# Patient Record
Sex: Female | Born: 1935 | Race: White | Hispanic: No | Smoking: Former smoker
Health system: Southern US, Community
[De-identification: ages and names within clinical notes are randomized; demographics above are authoritative.]

## PROBLEM LIST (undated history)

## (undated) DIAGNOSIS — I779 Disorder of arteries and arterioles, unspecified: Secondary | ICD-10-CM

## (undated) DIAGNOSIS — R21 Rash and other nonspecific skin eruption: Secondary | ICD-10-CM

## (undated) DIAGNOSIS — F419 Anxiety disorder, unspecified: Secondary | ICD-10-CM

## (undated) DIAGNOSIS — J449 Chronic obstructive pulmonary disease, unspecified: Secondary | ICD-10-CM

## (undated) DIAGNOSIS — J9611 Chronic respiratory failure with hypoxia: Secondary | ICD-10-CM

## (undated) DIAGNOSIS — Q828 Other specified congenital malformations of skin: Secondary | ICD-10-CM

## (undated) DIAGNOSIS — E785 Hyperlipidemia, unspecified: Secondary | ICD-10-CM

## (undated) DIAGNOSIS — I4891 Unspecified atrial fibrillation: Secondary | ICD-10-CM

## (undated) DIAGNOSIS — M359 Systemic involvement of connective tissue, unspecified: Secondary | ICD-10-CM

## (undated) DIAGNOSIS — Z789 Other specified health status: Secondary | ICD-10-CM

## (undated) DIAGNOSIS — I739 Peripheral vascular disease, unspecified: Secondary | ICD-10-CM

## (undated) DIAGNOSIS — E079 Disorder of thyroid, unspecified: Secondary | ICD-10-CM

## (undated) DIAGNOSIS — I10 Essential (primary) hypertension: Secondary | ICD-10-CM

## (undated) DIAGNOSIS — I509 Heart failure, unspecified: Secondary | ICD-10-CM

## (undated) HISTORY — PX: BACK SURGERY: SHX140

---

## 1999-01-18 ENCOUNTER — Encounter: Payer: Self-pay | Admitting: *Deleted

## 1999-01-19 ENCOUNTER — Inpatient Hospital Stay: Admission: RE | Admit: 1999-01-19 | Discharge: 1999-01-21 | Payer: Self-pay | Admitting: *Deleted

## 1999-07-26 ENCOUNTER — Encounter: Payer: Self-pay | Admitting: Emergency Medicine

## 1999-07-26 ENCOUNTER — Emergency Department (HOSPITAL_COMMUNITY): Admission: EM | Admit: 1999-07-26 | Discharge: 1999-07-26 | Payer: Self-pay | Admitting: Emergency Medicine

## 1999-08-06 ENCOUNTER — Ambulatory Visit (HOSPITAL_COMMUNITY): Admission: RE | Admit: 1999-08-06 | Discharge: 1999-08-06 | Payer: Self-pay | Admitting: Specialist

## 1999-08-06 ENCOUNTER — Encounter: Payer: Self-pay | Admitting: Specialist

## 2002-04-16 ENCOUNTER — Ambulatory Visit (HOSPITAL_COMMUNITY): Admission: RE | Admit: 2002-04-16 | Discharge: 2002-04-16 | Payer: Self-pay | Admitting: Urology

## 2002-04-16 ENCOUNTER — Encounter: Payer: Self-pay | Admitting: Urology

## 2003-03-10 ENCOUNTER — Emergency Department (HOSPITAL_COMMUNITY): Admission: EM | Admit: 2003-03-10 | Discharge: 2003-03-10 | Payer: Self-pay | Admitting: Emergency Medicine

## 2003-05-03 ENCOUNTER — Ambulatory Visit (HOSPITAL_COMMUNITY): Admission: RE | Admit: 2003-05-03 | Discharge: 2003-05-03 | Payer: Self-pay | Admitting: Ophthalmology

## 2004-11-25 ENCOUNTER — Emergency Department (HOSPITAL_COMMUNITY): Admission: EM | Admit: 2004-11-25 | Discharge: 2004-11-25 | Payer: Self-pay | Admitting: Emergency Medicine

## 2007-10-13 ENCOUNTER — Ambulatory Visit: Payer: Self-pay | Admitting: Cardiovascular Disease

## 2008-01-13 ENCOUNTER — Encounter (HOSPITAL_COMMUNITY): Admission: RE | Admit: 2008-01-13 | Discharge: 2008-02-12 | Payer: Self-pay | Admitting: Cardiovascular Disease

## 2008-01-13 ENCOUNTER — Ambulatory Visit: Payer: Self-pay | Admitting: Cardiovascular Disease

## 2009-05-08 ENCOUNTER — Ambulatory Visit: Payer: Self-pay | Admitting: Internal Medicine

## 2009-05-08 ENCOUNTER — Inpatient Hospital Stay (HOSPITAL_COMMUNITY): Admission: EM | Admit: 2009-05-08 | Discharge: 2009-05-19 | Payer: Self-pay | Admitting: Emergency Medicine

## 2009-05-10 ENCOUNTER — Encounter (INDEPENDENT_AMBULATORY_CARE_PROVIDER_SITE_OTHER): Payer: Self-pay | Admitting: Internal Medicine

## 2009-05-11 ENCOUNTER — Encounter: Payer: Self-pay | Admitting: Internal Medicine

## 2009-05-30 ENCOUNTER — Encounter: Payer: Self-pay | Admitting: Interventional Radiology

## 2010-12-16 ENCOUNTER — Encounter: Payer: Self-pay | Admitting: Cardiovascular Disease

## 2011-02-06 ENCOUNTER — Ambulatory Visit (HOSPITAL_COMMUNITY)
Admission: RE | Admit: 2011-02-06 | Discharge: 2011-02-06 | Disposition: A | Discharge status: Home / Self Care | Payer: Medicare Other | Source: Ambulatory Visit | Attending: Internal Medicine | Admitting: Internal Medicine

## 2011-02-06 ENCOUNTER — Other Ambulatory Visit (HOSPITAL_COMMUNITY): Payer: Self-pay | Admitting: Internal Medicine

## 2011-02-06 DIAGNOSIS — N390 Urinary tract infection, site not specified: Secondary | ICD-10-CM

## 2011-02-06 DIAGNOSIS — N200 Calculus of kidney: Secondary | ICD-10-CM

## 2011-02-06 DIAGNOSIS — R1032 Left lower quadrant pain: Secondary | ICD-10-CM | POA: Insufficient documentation

## 2011-03-04 LAB — BASIC METABOLIC PANEL
BUN: 17 mg/dL (ref 6–23)
BUN: 18 mg/dL (ref 6–23)
BUN: 29 mg/dL — ABNORMAL HIGH (ref 6–23)
CO2: 21 mEq/L (ref 19–32)
CO2: 30 mEq/L (ref 19–32)
CO2: 23 mEq/L (ref 19–32)
Calcium: 8.4 mg/dL (ref 8.4–10.5)
Chloride: 109 mEq/L (ref 96–112)
Creatinine, Ser: 1.01 mg/dL (ref 0.4–1.2)
GFR calc Af Amer: >60 mL/min (ref >60)
GFR calc non Af Amer: 54 mL/min — ABNORMAL LOW (ref >60)
GFR calc non Af Amer: 54 mL/min — ABNORMAL LOW (ref >60)
GFR calc non Af Amer: 57 mL/min — ABNORMAL LOW (ref >60)
GFR calc non Af Amer: 48 mL/min — ABNORMAL LOW (ref >60)
Glucose, Bld: 103 mg/dL — ABNORMAL HIGH (ref 70–99)
Glucose, Bld: 106 mg/dL — ABNORMAL HIGH (ref 70–99)
Glucose, Bld: 111 mg/dL — ABNORMAL HIGH (ref 70–99)
Glucose, Bld: 91 mg/dL (ref 70–99)
Glucose, Bld: 111 mg/dL — ABNORMAL HIGH (ref 70–99)
Potassium: 4.2 mEq/L (ref 3.5–5.1)
Potassium: 4.3 mEq/L (ref 3.5–5.1)
Potassium: 4.4 mEq/L (ref 3.5–5.1)
Potassium: 4.2 mEq/L (ref 3.5–5.1)
Sodium: 140 mEq/L (ref 135–145)
Sodium: 140 mEq/L (ref 135–145)
Sodium: 143 mEq/L (ref 135–145)

## 2011-03-04 LAB — CBC
HCT: 25.2 % — ABNORMAL LOW (ref 36.0–46.0)
HCT: 28.2 % — ABNORMAL LOW (ref 36.0–46.0)
HCT: 29.5 % — ABNORMAL LOW (ref 36.0–46.0)
HCT: 32.7 % — ABNORMAL LOW (ref 36.0–46.0)
HCT: 35.6 % — ABNORMAL LOW (ref 36.0–46.0)
Hemoglobin: 9.3 g/dL — ABNORMAL LOW (ref 12.0–15.0)
Hemoglobin: 9.4 g/dL — ABNORMAL LOW (ref 12.0–15.0)
Hemoglobin: 9.8 g/dL — ABNORMAL LOW (ref 12.0–15.0)
Hemoglobin: 9.9 g/dL — ABNORMAL LOW (ref 12.0–15.0)
Hemoglobin: 11.9 g/dL — ABNORMAL LOW (ref 12.0–15.0)
MCHC: 33.6 g/dL (ref 30.0–36.0)
MCHC: 33.8 g/dL (ref 30.0–36.0)
MCHC: 33.9 g/dL (ref 30.0–36.0)
MCHC: 34.6 g/dL (ref 30.0–36.0)
MCV: 100.9 fL — ABNORMAL HIGH (ref 78.0–100.0)
MCV: 101.8 fL — ABNORMAL HIGH (ref 78.0–100.0)
MCV: 100.5 fL — ABNORMAL HIGH (ref 78.0–100.0)
Platelets: 136 10*3/uL — ABNORMAL LOW (ref 150–400)
Platelets: 188 10*3/uL (ref 150–400)
Platelets: 191 10*3/uL (ref 150–400)
RDW: 13.8 % (ref 11.5–15.5)
RDW: 13.9 % (ref 11.5–15.5)
RDW: 14 % (ref 11.5–15.5)
RDW: 14 % (ref 11.5–15.5)
RDW: 14.1 % (ref 11.5–15.5)
RDW: 14.2 % (ref 11.5–15.5)
WBC: 6.1 10*3/uL (ref 4.0–10.5)
WBC: 8.9 10*3/uL (ref 4.0–10.5)

## 2011-03-04 LAB — CULTURE, BLOOD (ROUTINE X 2): Culture: NO GROWTH

## 2011-03-04 LAB — URINE CULTURE
Colony Count: 10000
Special Requests: NEGATIVE

## 2011-03-04 LAB — DIFFERENTIAL
Basophils Absolute: 0 10*3/uL (ref 0.0–0.1)
Basophils Relative: 1 % (ref 0–1)
Eosinophils Absolute: 0.1 10*3/uL (ref 0.0–0.7)
Eosinophils Relative: 1 % (ref 0–5)
Lymphocytes Relative: 28 % (ref 12–46)
Monocytes Absolute: 0.4 10*3/uL (ref 0.1–1.0)

## 2011-03-04 LAB — RETICULOCYTES: Retic Ct Pct: 1.9 % (ref 0.4–3.1)

## 2011-03-04 LAB — IRON AND TIBC
Saturation Ratios: 13 % — ABNORMAL LOW (ref 20–55)
UIBC: 336 ug/dL

## 2011-03-04 LAB — VITAMIN B12: Vitamin B-12: 786 pg/mL (ref 211–911)

## 2011-03-04 LAB — FOLATE: Folate: 15.7 ng/mL

## 2011-03-04 LAB — URINALYSIS, ROUTINE W REFLEX MICROSCOPIC
Bilirubin Urine: NEGATIVE
Ketones, ur: NEGATIVE mg/dL
Nitrite: NEGATIVE
Protein, ur: NEGATIVE mg/dL

## 2011-04-09 NOTE — Discharge Summary (Signed)
NAMEDIALA, WAXMAN               ACCOUNT NO.:  [id number]   MEDICAL RECORD NO.:  [id number]          PATIENT TYPE:  INP   LOCATION:  3009                         FACILITY:  [facility]   PHYSICIAN:  Beckey Rutter, MD  DATE OF BIRTH:  [birth year]   DATE OF ADMISSION:  05/08/2009  DATE OF DISCHARGE:  05/19/2009                               DISCHARGE SUMMARY   PRIMARY CARE PHYSICIAN:  Dr. Sherril Croon in [place].   CHIEF COMPLAINT:  the patient admitted after fall.   PROCEDURES:  1. CT spine without contrast on May 08, 2009.  Impression:      Significant left scalp injury.  No acute intracranial or calvarial      findings.  The cervical part of the CT scan was showing no evidence      of acute cervical spine fracture, subluxation, or static signs of      instability.  2. CT head without contrast was negative as delineated above.  3. The x-ray to the knee showing no acute osseous finding to the knee      joint.  4. Lumbar spine x-rays showing lumbar spondylosis as described.  No      acute osseous finding or malalignment.  5. Chest x-ray on June 14.  The patient has no active cardiopulmonary      disease.  6. X-ray to the thoracic spine with impression showing age-      indeterminate T4 compression fracture.  7. Chest x-ray on May 09, 2009.  Impression:  Stable, no acute      cardiopulmonary disease.  8. CT to the thoracic spine on May 09, 2009.  Impression:      a.     Fracture of the T3 and T4 vertebral bodies as described.       Both fractures appear to extend into the posterior elements.       Also, those elements are nondisplaced, and there is no associated       facet subluxation or anterolisthesis.      b.     Small amount of peroneal hemorrhage suspected.  There is no       large subdural hematoma or compromise.  9. Vertebroplasty on May 11, 2009.  Impression is status post      vertebral body augmentation for compression fracture at T4 using      vertebroplasty  technique, done by Dr. Grandville Silos. Deveshwar.  10.Chest x-ray May 11, 2009.  Impression:  Cardiomegaly with vascular      congestion.  11.Chest x-ray on May 16, 2009.  Impression:  Cardiomegaly with      vascular congestion plus questionable mild interstitial edema.  12.May 18, 2009, chest x-ray on the patient showing progressive      congestive heart failure.   DISCHARGE MEDICATIONS:  1. Aspirin 81 mg p.o. daily.  2. Calcitonin salmon nasal spray daily.  3. Os-Cal 1 tablet p.o. daily.  4. Celexa 20 mg daily.  5. Plavix 75 mg daily.  6. Cyanocobalamin B12 1000 mcg subcutaneous every week for 4 weeks.  7. Lasix 20 mg p.o. daily.  8. Synthroid 100 mcg p.o. daily.  9. Methotrexate 10 mg p.o. q. Sunday.  Dose as prescribed.  10.Vicodin 7.5/500 one tablet p.o. q.4 hours.  11.Senokot S 2 tablets p.o. b.i.d.  12.Zocor 40 mg p.o. daily.  13.Miralax 17 g p.o. daily p.r.n.  14.The Ultram dose is 50-100 mg p.o. every 4 to 6 hours p.r.n. pain.      This medicine should be given after Vicodin if the pain persists      after the Vicodin medication.  This medicine should not be given if      the patient is lethargic.   DISCHARGE DIAGNOSES:  1. A T4 compression fracture status post kyphoplasty/vertebroplasty by      interventional radiology.  2. Scalp hematoma status post fold.  3. Status post hypertension.  4. Hypothyroidism on Synthroid.  5. Hyperlipidemia.   DISCUSSION/PLAN:  The patient is status post vertebroplasty as discussed  above.  The patient was complaining of constipation and she was being  given laxative prior to her discharge today.  This constipation is felt  secondary to the excessive pain medication she received secondary to her  compression fracture.  She is stable for discharge today to the nursing  facility to continue with physical therapy.      Beckey Rutter, MD  Electronically Signed     EME/MEDQ  D:  05/19/2009  T:  05/19/2009  Job:  [id number]   cc:    Dr. Sherril Croon

## 2011-04-09 NOTE — Assessment & Plan Note (Signed)
[facility]                       [place] CARDIOLOGY OFFICE NOTE   Sierra Singleton, Sierra Singleton                      MRN:          [id number]  DATE:10/13/2007                            DOB:          11-09-1933    Sierra Singleton is a 77 year old patient referred by [facility],  Dr. Sherril Croon, for dyspnea.   In talking to the patient, her dyspnea has been chronic. It has been  well over a year. There has been no cough or pleuritic pain. No history  of pulmonary hypertension, DVT. She does not have dyspnea at rest. It  can be particularly bad when she goes up hill. She rarely does not think  that her dyspnea has changed at all over the last year. She is a  nonsmoker.   I suspect she was referred more for her cardiovascular risk factors and  screening for coronary disease. The patient has had a previous carotid  endarterectomy by Dr. Madilyn Fireman. This was many years ago. We do not have  records on this as the patient indicates that this was probably back in  1998 or 1999. She actually needs followup surveillance for this and  since she has persistent bruits.   Her coronary risk factors otherwise include hypertension, hyperlipidemia  and known vascular disease.   The patient has not had any significant chest pain. She does  occasionally get postural lightheadedness when she stands up too  quickly.   She is a nondiabetic, nonsmoker.   Her review of systems is otherwise negative.   PAST SURGICAL HISTORY:  Left carotid endarterectomy by Dr. Madilyn Fireman in  1998.   ALLERGIES:  She has no known allergies.   She has a history of hyperlipidemia and hypertension.   The patient's first husband is widowed. She divorced her second husband.  She just got laid off from [employer]. She has three children.  She is fairly inactive and does not smoke or drink.   The patient's family history is remarkable for father dying of cancer in  his 68s, mother dying of a  stroke in her 56s.   CURRENT MEDICATIONS:  1. Methotrexate 2.5 a day.  2. Diovan hydrochlorothiazide 160/12.5.  3. Synthroid 75 mcg a day.  4. Plavix 75 a day.  5. Lipitor 20 a day.  6. Folic acid.  7. Aspirin a day.  8. Ambien.  9. Lyrica 75 a day.   PHYSICAL EXAMINATION:  Remarkable for a somewhat anxious white female in  no distress. Her blood pressure is 130/70, pulse 75 and regular.  Respiratory rate is 14. Afebrile.  HEENT: Is unremarkable. Bilateral carotid bruits with left CEA. No JVP  elevation or lymphadenopathy or thyromegaly.  LUNGS:  Clear with good diaphragmatic motion. No wheezing.  S1, S2 with normal heart sounds. PMI normal.  ABDOMEN: Benign. Bowel sounds positive. No bruits. No hepatosplenomegaly  or hepatojugular reflux. No tenderness. No AAA.  Distal pulses are intact. No edema. She has varicosities with spider  veins, trace edema.   EKG is normal.   IMPRESSION:  1. Dyspnea, functional. No evidence of cardiopulmonary disease. Check  2D echocardiogram to assess left ventricular and right ventricular      function. Continue low dose diuretic.  2. Vascular disease. Needs followup carotid duplex for screening      persistent bruits. Continue aspirin and Plavix.  3. Screening coronary artery disease. Given her risk factors of      hyperlipidemia, hypertension and vascular disease, she will have an      adenosine Myoview study.  4. Hyperlipidemia. Continue Lipitor 20 a day. Lipid and liver profile      in six months.  5. Hypothyroidism. Check TSH and T4 in six months. Continue Synthroid      75 mcg a day.  6. Hypertension. Currently well-controlled. Continue low salt diet and      Diovan hydrochlorothiazide to continue.  7. Peripheral spider veins. Continue low dose diuretic. The patient      does not seem interested in having these taken care of. I suspect      that one of the vein clinics could inject them. This will be      discussed with Dr.  Sherril Croon.   We will see her on an as needed basis unless her echo or Myoview show  marked abnormalities. If she has significant carotid re-stenosis she  will see Dr. Madilyn Fireman.     Sierra Singleton. Eden Emms, MD, [facility]  Electronically Signed    PCN/MedQ  DD: 10/13/2007  DT: 10/13/2007  Job #: [id number]

## 2011-04-09 NOTE — Consult Note (Signed)
Sierra Singleton, Sierra Singleton               ACCOUNT NO.:  [id number]   MEDICAL RECORD NO.:  [id number]          PATIENT TYPE:  OUT   LOCATION:  XRAY                         FACILITY:  [facility]   PHYSICIAN:  Sanjeev K. Deveshwar, M.D.DATE OF BIRTH:  May 27, 1933   DATE OF CONSULTATION:  05/30/2009  DATE OF DISCHARGE:  05/30/2009                                 CONSULTATION   CHIEF COMPLAINT:  Status post T4 vertebroplasty performed on May 11, 2009.   BRIEF HISTORY:  This is a pleasant 77 year old female who fell at home  on May 08, 2009, and suffered facial injuries including a scalp  laceration as well as thoracic compression fractures of T4 and possibly  T3.  The patient was admitted to [facility].  Dr. Corliss Skains saw  the patient in consultation for evaluation of her fractures.  On May 11, 2009, the patient underwent a vertebroplasty at the T4 level.  She  returns today accompanied by her daughter-in-law who is an intensive  care nurse at [employer] to be seen in followup.   PAST MEDICAL HISTORY:  Significant for hypertension, peripheral vascular  disease, hypothyroidism, B12 deficiency, macrocytic anemia,  osteoporosis, and a severe skin disorder, which she calls Hailey-Hailey.   SURGICAL HISTORY:  Significant for carotid endarterectomy in 1998.   ALLERGIES:  She is allergic to ADHESIVES.   CURRENT MEDICATIONS:  1. Miconazole twice daily to the affected areas of her skin.  2. Celexa 20 mg daily.  3. Zocor 40 mg at bedtime.  4. Plavix 75 mg daily.  5. Ultram p.r.n. for pain.  6. Vicodin p.r.n. for pain.  7. Lasix 20 mg daily.  8. Aspirin 81 mg each evening.  9. Calcitonin spray daily.  10.Calcium with vitamin D daily.  11.Folic acid 1 mg daily.  12.Methotrexate 10 mg every Sunday.  13.The patient is also on Synthroid 100 mcg each morning.  14.Vitamin B12 injections once each week.   SOCIAL HISTORY:  The patient is widowed.  She has 3 children.  She had  been  living alone in [place].  She is now at the [facility]  in [place].  She quit smoking 20 years ago.  She was a heavy smoker  in the past.  She denies alcohol use.  She is retired from [employer] and  she previously did Therapist, sports.   FAMILY HISTORY:  Her mother died from CVA in her 38s.  Her father died  from cancer in his 20s.   IMPRESSION AND PLAN:  The patient returns today accompanied by her  daughter-in-law and intensive care nurse to be seen in followup after  undergoing a T4 vertebroplasty on May 11, 2009.  The patient states she  is still having significant pain, although it is hard for her to  differentiate the pain in her back from the pain related to her skin  disorder, which covers the majority of her trunk.  She does feel,  however, that she is significantly improved after undergoing the  vertebroplasty and her daughter-in-law reports that she is much more  mobile now.  The  patient made multiple request to have her pain  medications increased, although Dr. Corliss Skains explained that this would  have to come from the primary care physician who is currently caring for  her.   The patient is scheduled to see a dermatologist for her skin disorder.  The patient's daughter-in-law feels once this is under control, her pain  will be significantly improved.  In the meantime, Dr. Corliss Skains  recommended she continue to ambulate with a walker and assistance as  tolerated.  She apparently is participating in physical therapy.  Further followup with Dr. Corliss Skains will be on a p.r.n. basis.  The  patient and her family were told to call if she develops further acute  back pain.   Greater than 15 minutes was spent on this followup visit.      Delton See, P.A.    ______________________________  Grandville Silos. Corliss Skains, M.D.    DR/MEDQ  D:  05/30/2009  T:  05/31/2009  Job:  [id number]   cc:   Doreen Beam, MD  Doran Durand, MD

## 2011-04-09 NOTE — H&P (Signed)
Sierra Singleton, SCHMUTZ               ACCOUNT NO.:  [id number]   MEDICAL RECORD NO.:  [id number]          PATIENT TYPE:  INP   LOCATION:  A337                          FACILITY:  [facility]   PHYSICIAN:  Osvaldo Shipper, MD     DATE OF BIRTH:  1933/11/23   DATE OF ADMISSION:  05/08/2009  DATE OF DISCHARGE:  LH                              HISTORY & PHYSICAL   The patient's PMD is Dr. Sherril Croon in [place].   ADMISSION DIAGNOSES:  1. T4 compression fracture status post fall.  2. Scalp hematoma secondary to fall.  3. History of hypertension.  4. History of carotid artery disease status post carotid      endarterectomy.  5. History of hypothyroidism.   CHIEF COMPLAINT:  Fall.   HISTORY OF PRESENT ILLNESS:  This patient is a 77 year old Caucasian  female with past medical history of hypertension, carotid artery  disease, who was in her usual state of health last night when she was  going to the kitchen at her home and she tripped and fell.  She did not  have any syncopal fall as far as she knows.  She took an Ambien and a  Vicodin overnight.  She fell to the left side of her body, hurt her left  head, and also hurt her back.  Currently, she has severe pain in her  middle back.  She denies any weakness in the legs.  She also has a  headache.  Otherwise, no chest pain or shortness of breath.   MEDICATIONS AT HOME:  Unfortunately, she does not know all of the  medications and family did not bring her medication bottles.  But, she  tells me that she is on the following.  1. Ambien.  2. Vicodin.  3. Plavix.  4. Diovan HCT.  5. Synthroid.  6. Aspirin.  7. She is on an antidepressant.  She does not know what that is.  8. B12 tablets.  9. She could also be on something for osteoporosis but she does not      really recall this.   ALLERGIES:  No known drug allergies.   PAST MEDICAL HISTORY:  Positive for coronary arterectomy 15 years ago.  No history of heart disease.  She does have  hypertension,  hypothyroidism.  No history of diabetes.  She is B12 deficient.   SOCIAL HISTORY:  She lives here in [place] by herself.  No smoking or  alcohol use as far as we known.   FAMILY HISTORY:  Unremarkable.   REVIEW OF SYSTEMS:  Complains mainly of back pain.  The patient is a  little bit confused after getting morphine so a full review of systems  cannot be done.  She is also on __________.   PHYSICAL EXAMINATION:  VITAL SIGNS:  Temperature 97.6, blood pressure  142/48, heart rate 66, respiratory rate 20, saturation 99% on room air.  GENERAL:  Elderly white female in moderate to severe discomfort but in  no distress.  HEENT:  There is bruising on the left side of the face.  The left eye is  swollen shut  but she can open it.  Pupils equal and reactive.  She also  has small tear over her left ear.  This area is tender to palpation.  NECK:  Soft and supple.  No thyromegaly.  LUNGS:  Clear to auscultation bilaterally.  No wheezing, rales, or  rhonchi.  CARDIOVASCULAR:  S1 and S2, normal, regular.  No murmurs appreciated.  No S3, S4.  No rubs or bruits.  ABDOMEN:  Soft, nontender, nondistended.  Bowel sounds are present.  No  masses or organomegaly is appreciated.  SKIN:  Bruising noted on the left side of the body over her legs, over  her flanks, over her back, shoulder.  MUSCULOSKELETAL:  She has full range of motion, left shoulder, left  elbow, left hip, left knee, ankle.  NEUROLOGICAL:  Alert, disoriented to some extent but no focal  neurological deficits noted.  BACK:  Examination of the back revealed tenderness over the thoracic  spine area.  She actually would not even cooperate with turning over.   LABS:  White count is normal, hemoglobin is 11.3, MCV is 100.2, platelet  count is 191,000.  Glucose 111.  BUN is 29, creatinine is 1.11.  BNP is  normal.  UA did not show any infection.   Thoracic spine showed compression fracture of T4.  Chest x-ray did not  show  any active process.  Lumbar spine shows lumbar spondylosis with no  acute osseous findings or malalignment.  Knee films show no acute  findings.  CT head showed scalp injury which is thought to be  significant with no other intracranial process or fractures.  C-spine  also was negative for any acute process.   She had an EKG done which shows a sinus rhythm with a normal axis.  Intervals appeared to be in the normal range.  No Q waves appreciated.  No ST-T wave changes are noted.   ASSESSMENT:  This is a 77 year old unfortunate female who fell really  severely and has hurt her left forehead.  She does not have any skull  fracture.  She does have a T4 compression fracture which is causing  severe pain.  She does not have any neurological deficits based on  examination.   PLAN:  1. T4 compression fracture status post fall with severe pain.  Pain      controlled with morphine.  Muscle relaxants were utilized.      Flexeril and Valium.  I do not think there is a clear role for      steroids at this time. We will hold off on that. For the T4      compression fracture, pain control is the main modality.      Sometimes, we do consider kyphoplasty although I do not know if      that will be utilized in this case.  I have tried to explain this      to the family.  2. Scalp hematoma.  Just cold compresses can be applied.  Monitor her      closely.  3. Hypertension.  Continue with Diovan HCT.  4. Hypothyroidism.  Continue with Synthroid.  5. Deep venous thrombosis prophylaxis.  For now only with sequential      compressive devices as we want to avoid significant bleeding.  If      she remains stable with stable hemoglobin and no bleeding, I think      we can initiate anticoagulation for DVT prophylaxis.   After evaluating the patient, the family  told me that they would want  the patient transferred to [facility]  Apparently, her daughter-in-law is one  of the nursing directors up there.  So, I  spoke to Ms. Gosling who  happens to be the said person and she also conveyed an interest to  transfer the patient there.   PLEASE NOTE:  Above is preliminary until signed.      Osvaldo Shipper, MD  Electronically Signed     GK/MEDQ  D:  05/08/2009  T:  05/08/2009  Job:  [id number]   cc:   Doreen Beam, MD  Fax: [phone]

## 2011-04-09 NOTE — H&P (Signed)
NAMEANDRETTA, ERGLE               ACCOUNT NO.:  [id number]   MEDICAL RECORD NO.:  [id number]          PATIENT TYPE:  INP   LOCATION:  3009                         FACILITY:  [facility]   PHYSICIAN:  Lonia Blood, M.D.DATE OF BIRTH:  [birth year]   DATE OF ADMISSION:  05/08/2009  DATE OF DISCHARGE:                              HISTORY & PHYSICAL   Please note [name] was originally admitted to [facility] on May 08, 2009.  I was contacted by Dr. Osvaldo Shipper this  afternoon in my capacity as the admitting physician for [facility]  [facility] with the patient's request to be transferred to [facility].  She was transferred to [facility] successfully and I am now seeing her  in followup.   CHIEF COMPLAINT:  The patient was originally admitted after sustaining a  fall and suffering with severe back pain as a result.   HISTORY OF PRESENT ILLNESS:  [name] is a very pleasant 77-  year-old female.  She is chronically on pain medication to include  Vicodin.  After taking her usual Ambien and apparently Vicodin as well,  the patient got up at 2:00 a.m. in the morning and then suffered a fall  in her kitchen.  In this fall she struck the left side of her head and  face and felt to the floor.  Apparently she was down for approximately 1  hour.  She was transported emergently to [facility] for  evaluation after she herself summoned EMS.  The patient was evaluated  there with x-rays of the left knee, the thoracic spine and the lumbar  spine as well as a CT scan of the head and cervical spine.  The patient  is noted to have significant left scalp hematomas in the left frontal  and parietal region and an indeterminate age T4 compression fracture.  Otherwise radiographic findings are unrevealing.  After the patient was  admitted and seen by Dr. Osvaldo Shipper of [facility]  the family and the patient requested that she be transferred  to [facility].  She has arrived safely via [facility].  Her present complaint is  that of ongoing back pain.  The pain is centered in the area of the T4  vertebral body but spreads throughout the entire back.  It is worse with  deep breath or movement.  There is no shortness of breath, diaphoresis  or chest pain associated with this.  There has been no nausea, vomiting,  diarrhea, fevers or chills.   REVIEW OF SYSTEMS:  Unremarkable with exception of positive elements  noted in the history of present illness above.   PAST MEDICAL HISTORY:  Reviewed as per Dr. Chancy Milroy admission history  and physical dated May 08, 2009.   OUTPATIENT MEDICATIONS:  Dosages unclear.  1. Ambien.  2. Vicodin  3. Plavix.  4. Diovan HCT.  5. Synthroid 88 mcg daily.  6. Aspirin.  7. B12  8. Celexa  9. Methotrexate.   ALLERGIES:  No known drug allergies.   FAMILY HISTORY:  The patient's  father died of cancer in his 57s and the  patient's mother died from a stroke in her 54s.   SOCIAL HISTORY:  The patient lives in the [place] area.  She does not  smoke.  She does not drink.  She has three healthy kids.  She is a  widow.   DATA REVIEWED:  Hemoglobin is low at 11.3 with an MCV of 100, platelet  count and white count are  normal.  Sodium, potassium, chloride, bicarb  are normal.  BUN is elevated at 29 with a creatinine of 1.1 and serum  glucose of 111, normal calcium.  BNP is normal.  UA is negative.  12-  lead EKG is normal sinus rhythm at 65 beats per minute.  Radiographic  findings are as discussed above.   PHYSICAL EXAMINATION:  Temperature 98.1, blood pressure 158/70, heart  rate 89, respiratory rate 28.  O2 saturation 90% on room air.  GENERAL:  Well-developed, well-nourished female in no acute respiratory  distress.  HEENT:  The patient has significant erythema and obvious hematoma  formation about the left frontal and parietal scalp region with  ecchymosis spreading around the left eye  and causing edema of the left  eyelid.  The patient is alert and oriented.  Her extraocular muscles are  intact bilaterally.  LUNGS:  Clear to auscultation bilaterally without wheezes or rhonchi.  CARDIOVASCULAR:  Regular rate and rhythm without murmur, gallop or rub.  Normal S1, S2.  ABDOMEN:  Nontender, nondistended, soft bowel sounds present.  No  organomegaly, rebound or ascites.  EXTREMITIES:  No significant cyanosis, clubbing, edema bilateral lower  extremities.  CUTANEOUS:  In addition to the above described lesions there is a  significant hematoma in the anterior tibial region approximately halfway  up the right leg with a small overlying abrasion but no significant  calor or purulent discharge or induration.  NEUROLOGIC:  The patient is alert and oriented x4 with cranial nerves II-  XII intact bilaterally.   IMPRESSION AND PLAN:  1. Accidental fall with T4 compression fracture.  I have discussed the      possibility of an MRI and consideration of kyphoplasty with the      patient.  She is quite nervous about the MRI and is not sure that      she wishes to proceed with this.  I have advised her to think this      over during the night.  We will reassess her options in the      morning.  Otherwise we will strive for pain control with morphine      as well as muscle relaxants.  Our goal is to achieve maximum level      of pain control without severely sedating the patient.  Should the      patient refuse MRI and possible kyphoplasty, then physical therapy,      occupational therapy will begin as soon as possible.  A calcitonin      nasal spray and calcium will be added to the patient's regimen.  2. Hypertension.  The patient's blood pressure is poorly controlled at      the present time.  This is not surprising given the severe pain the      patient is experiencing.  I do not feel that it would be      appropriate or necessary to aggressively up titrate the patient's       medications at present.  I will continue her Diovan  and treat her      pain.  We will follow her blood pressure trend.  3. History of peripheral vascular disease status post carotid      endarterectomy 1998.  We will continue aspirin for now but given      the possibility of the patient undergoing a kyphoplasty within the      next few days, I will hold the patient's Plavix.  4. Hypothyroidism.  We will check a TSH.  5. Reported B12 deficiency.  We will check a B12 level.  6. Macrocytic anemia.  The patient does in fact have a macrocytic      anemia with a hemoglobin of 11.3.  We will check a full anemia      panel to rule out the possibility of a mixed issue but certainly we      will be very interested in the B12 and folic acid levels.      Lonia Blood, M.D.  Electronically Signed     JTM/MEDQ  D:  05/08/2009  T:  05/08/2009  Job:  [id number]

## 2011-07-05 DIAGNOSIS — R0602 Shortness of breath: Secondary | ICD-10-CM

## 2013-03-31 ENCOUNTER — Other Ambulatory Visit (HOSPITAL_COMMUNITY): Payer: Self-pay | Admitting: Internal Medicine

## 2013-03-31 ENCOUNTER — Ambulatory Visit (HOSPITAL_COMMUNITY)
Admission: RE | Admit: 2013-03-31 | Discharge: 2013-03-31 | Disposition: A | Discharge status: Home / Self Care | Payer: Medicare Other | Source: Ambulatory Visit | Attending: Internal Medicine | Admitting: Internal Medicine

## 2013-03-31 DIAGNOSIS — R05 Cough: Secondary | ICD-10-CM

## 2013-03-31 DIAGNOSIS — R059 Cough, unspecified: Secondary | ICD-10-CM | POA: Insufficient documentation

## 2013-03-31 DIAGNOSIS — J4489 Other specified chronic obstructive pulmonary disease: Secondary | ICD-10-CM | POA: Insufficient documentation

## 2013-03-31 DIAGNOSIS — J449 Chronic obstructive pulmonary disease, unspecified: Secondary | ICD-10-CM | POA: Insufficient documentation

## 2013-04-13 ENCOUNTER — Other Ambulatory Visit: Payer: Self-pay | Admitting: *Deleted

## 2014-12-23 ENCOUNTER — Emergency Department (HOSPITAL_COMMUNITY)
Admission: EM | Admit: 2014-12-23 | Discharge: 2014-12-23 | Disposition: A | Discharge status: Home / Self Care | Payer: Medicare Other | Attending: Emergency Medicine | Admitting: Emergency Medicine

## 2014-12-23 ENCOUNTER — Encounter (HOSPITAL_COMMUNITY): Payer: Self-pay | Admitting: *Deleted

## 2014-12-23 DIAGNOSIS — E785 Hyperlipidemia, unspecified: Secondary | ICD-10-CM | POA: Diagnosis not present

## 2014-12-23 DIAGNOSIS — F419 Anxiety disorder, unspecified: Secondary | ICD-10-CM | POA: Diagnosis not present

## 2014-12-23 DIAGNOSIS — I10 Essential (primary) hypertension: Secondary | ICD-10-CM | POA: Diagnosis not present

## 2014-12-23 DIAGNOSIS — E039 Hypothyroidism, unspecified: Secondary | ICD-10-CM | POA: Diagnosis not present

## 2014-12-23 DIAGNOSIS — E875 Hyperkalemia: Secondary | ICD-10-CM | POA: Diagnosis present

## 2014-12-23 DIAGNOSIS — Z7902 Long term (current) use of antithrombotics/antiplatelets: Secondary | ICD-10-CM | POA: Insufficient documentation

## 2014-12-23 DIAGNOSIS — J449 Chronic obstructive pulmonary disease, unspecified: Secondary | ICD-10-CM | POA: Insufficient documentation

## 2014-12-23 DIAGNOSIS — Z79899 Other long term (current) drug therapy: Secondary | ICD-10-CM | POA: Insufficient documentation

## 2014-12-23 DIAGNOSIS — N289 Disorder of kidney and ureter, unspecified: Secondary | ICD-10-CM | POA: Diagnosis not present

## 2014-12-23 HISTORY — DX: Hyperlipidemia, unspecified: E78.5

## 2014-12-23 HISTORY — DX: Chronic obstructive pulmonary disease, unspecified: J44.9

## 2014-12-23 HISTORY — DX: Essential (primary) hypertension: I10

## 2014-12-23 HISTORY — DX: Rash and other nonspecific skin eruption: R21

## 2014-12-23 HISTORY — DX: Disorder of thyroid, unspecified: E07.9

## 2014-12-23 HISTORY — DX: Anxiety disorder, unspecified: F41.9

## 2014-12-23 HISTORY — DX: Other specified health status: Z78.9

## 2014-12-23 LAB — COMPREHENSIVE METABOLIC PANEL
ALBUMIN: 2.8 g/dL — AB (ref 3.5–5.2)
ALT: 18 U/L (ref 0–35)
ANION GAP: 6 (ref 5–15)
AST: 18 U/L (ref 0–37)
Alkaline Phosphatase: 50 U/L (ref 39–117)
BUN: 26 mg/dL — ABNORMAL HIGH (ref 6–23)
CO2: 30 mmol/L (ref 19–32)
Calcium: 8.2 mg/dL — ABNORMAL LOW (ref 8.4–10.5)
Chloride: 102 mmol/L (ref 96–112)
Creatinine, Ser: 1.37 mg/dL — ABNORMAL HIGH (ref 0.50–1.10)
GFR calc non Af Amer: 36 mL/min — ABNORMAL LOW (ref >90)
GFR, EST AFRICAN AMERICAN: 41 mL/min — AB (ref >90)
GLUCOSE: 104 mg/dL — AB (ref 70–99)
Potassium: 3.6 mmol/L (ref 3.5–5.1)
Sodium: 138 mmol/L (ref 135–145)
Total Bilirubin: 1 mg/dL (ref 0.3–1.2)
Total Protein: 5.7 g/dL — ABNORMAL LOW (ref 6.0–8.3)

## 2014-12-23 LAB — CBC
HCT: 37.2 % (ref 36.0–46.0)
HEMOGLOBIN: 11.9 g/dL — AB (ref 12.0–15.0)
MCH: 32.7 pg (ref 26.0–34.0)
MCHC: 32 g/dL (ref 30.0–36.0)
MCV: 102.2 fL — ABNORMAL HIGH (ref 78.0–100.0)
PLATELETS: 204 10*3/uL (ref 150–400)
RBC: 3.64 MIL/uL — ABNORMAL LOW (ref 3.87–5.11)
RDW: 13.5 % (ref 11.5–15.5)
WBC: 9.6 10*3/uL (ref 4.0–10.5)

## 2014-12-23 NOTE — ED Provider Notes (Signed)
CSN: [id number]     Arrival date & time 12/23/14  1053 History  This chart was scribed for [name], MD by Gwenyth Ober, ED Scribe. This patient was seen in room APA06/APA06 and the patient's care was started at 12:47 PM.    Chief Complaint  Patient presents with  . hyperkalemia    The history is provided by the patient. No language interpreter was used.   81 year old female, presents by ambulance after she was called by her physician's office and told to get immediately to the hospital because she had hyperkalemia on blood work that was drawn yesterday. She reports a potassium level of 7. She states that she has been generally weak but this has been persistent ever since taking antibiotics for her skin rash over the last couple of weeks. She was admitted at [facility] this last week, she will not give me any further information except stating that she was in the intensive care unit related to a rash which she thought was secondary to clindamycin. She denies diarrhea, dysuria, shortness of breath, cough, chest pain, fever, chills, nausea, vomiting or diarrhea.  Past Medical History  Diagnosis Date  . Rash     [name] rash  . Anxiety   . Thyroid disease   . Hyperlipidemia   . Hypertension   . COPD (chronic obstructive pulmonary disease)   . Poor historian    Past Surgical History  Procedure Laterality Date  . Back surgery     No family history on file. History  Substance Use Topics  . Smoking status: Never Smoker   . Smokeless tobacco: Not on file  . Alcohol Use: No   OB History    No data available     Review of Systems  All other systems reviewed and are negative.     Allergies  Sulfa antibiotics and Clindamycin/lincomycin  Home Medications   Prior to Admission medications   Medication Sig Start Date End Date Taking? Authorizing Provider  atorvastatin (LIPITOR) 20 MG tablet Take 20 mg by mouth daily.   Yes Historical Provider, MD  citalopram  (CELEXA) 40 MG tablet Take 40 mg by mouth daily.   Yes Historical Provider, MD  clopidogrel (PLAVIX) 75 MG tablet Take 75 mg by mouth daily.   Yes Historical Provider, MD  folic acid (FOLVITE) 1 MG tablet Take 1 mg by mouth daily.   Yes Historical Provider, MD  HYDROcodone-acetaminophen (NORCO) 7.5-325 MG per tablet Take 1 tablet by mouth 3 (three) times daily as needed for moderate pain.   Yes Historical Provider, MD  levothyroxine (SYNTHROID, LEVOTHROID) 100 MCG tablet Take 100 mcg by mouth daily before breakfast.   Yes Historical Provider, MD  metoprolol tartrate (LOPRESSOR) 25 MG tablet Take 25 mg by mouth.   Yes Historical Provider, MD  nystatin (MYCOSTATIN) 100000 UNIT/ML suspension Take 5 mLs by mouth 4 (four) times daily.   Yes Historical Provider, MD  traZODone (DESYREL) 150 MG tablet Take by mouth at bedtime.   Yes Historical Provider, MD   BP 157/49 mmHg  Pulse 53  Temp(Src) 98.2 F (36.8 C) (Oral)  Resp 19  SpO2 89% Physical Exam  Constitutional: She appears well-developed and well-nourished. No distress.  HENT:  Head: Normocephalic and atraumatic.  Mouth/Throat: Oropharynx is clear and moist. No oropharyngeal exudate.  Eyes: Conjunctivae and EOM are normal. Pupils are equal, round, and reactive to light. Right eye exhibits no discharge. Left eye exhibits no discharge. No scleral icterus.  Neck: Normal  range of motion. Neck supple. No JVD present. No thyromegaly present.  Cardiovascular: Normal rate, regular rhythm, normal heart sounds and intact distal pulses.  Exam reveals no gallop and no friction rub.   No murmur heard. Pulmonary/Chest: Effort normal and breath sounds normal. No respiratory distress. She has no wheezes. She has no rales.  Abdominal: Soft. Bowel sounds are normal. She exhibits no distension and no mass. There is no tenderness.  Musculoskeletal: Normal range of motion. She exhibits no edema or tenderness.  Lymphadenopathy:    She has no cervical adenopathy.   Neurological: She is alert. Coordination normal.  Skin: Skin is warm and dry. No rash noted. No erythema.  The skin is dry, there is some evidence of prior desquamation of the fingertips, no signs of vesicular lesions or bullae, no pustules, no petechiae or purpura, no lesions in the mouth  Psychiatric: She has a normal mood and affect. Her behavior is normal.  Nursing note and vitals reviewed.   ED Course  Procedures (including critical care time) DIAGNOSTIC STUDIES: Sat's normal COORDINATION OF CARE: 12:47 PM Discussed treatment plan with pt at bedside and pt agreed to plan.   Labs Review Labs Reviewed  COMPREHENSIVE METABOLIC PANEL - Abnormal; Notable for the following:    Glucose, Bld 104 (*)    BUN 26 (*)    Creatinine, Ser 1.37 (*)    Calcium 8.2 (*)    Total Protein 5.7 (*)    Albumin 2.8 (*)    GFR calc non Af Amer 36 (*)    GFR calc Af Amer 41 (*)    All other components within normal limits  CBC - Abnormal; Notable for the following:    RBC 3.64 (*)    Hemoglobin 11.9 (*)    MCV 102.2 (*)    All other components within normal limits    Imaging Review No results found.  ED ECG REPORT  I personally interpreted this EKG   Date: 12/23/2014   Rate: 62  Rhythm: normal sinus rhythm  QRS Axis: normal  Intervals: normal  ST/T Wave abnormalities: normal  Conduction Disutrbances:none  Narrative Interpretation:   Old EKG Reviewed: none available   MDM   Final diagnoses:  Renal insufficiency    The patient appears to be comfortable, she is in no acute distress, vital signs are unremarkable including no tachycardia, EKG shows no signs of hyperkalemia, repeat lab work to ensure a true number. Cardiac monitoring while waiting.  Medical records obtained and reviewed from outside hospital, patient was admitted for over 7 days secondary to an allergic reaction to clindamycin which sent her into acute renal failure with a creatinine closed 2.5, it improved to 1.4  prior to discharge. She was given rehydration, had an echocardiogram of her heart which showed a normal ejection fraction. She had a chest x-ray showing bilateral pulmonary edema, she was given Lasix and diuresed. 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  K normal  I personally performed the services described in this documentation, which was scribed in my presence. The recorded information has been reviewed and is accurate.      Vida RollerBrian D Christabella Alvira, MD 12/23/14 1247

## 2014-12-23 NOTE — ED Notes (Signed)
Pt attempting to contact her son for discharge to home.

## 2014-12-23 NOTE — ED Notes (Signed)
Pt reports her son is out of town in [place] and her neighbor is not home. Pt states she is unable to get a ride home.

## 2014-12-23 NOTE — ED Notes (Signed)
Pt reports had an allergic reaction to clindamycin and was admitted to [facility].  Reports got out Wednesday.  Pt says was called today with a critically high potassium.

## 2014-12-23 NOTE — Discharge Instructions (Signed)
Please call your doctor for a followup appointment within 24-48 hours. When you talk to your doctor please let them know that you were seen in the emergency department and have them acquire all of your records so that they can discuss the findings with you and formulate a treatment plan to fully care for your new and ongoing problems. ° °

## 2014-12-29 ENCOUNTER — Inpatient Hospital Stay (HOSPITAL_COMMUNITY)
Admission: EM | Admit: 2014-12-29 | Discharge: 2015-01-02 | DRG: 292 | Disposition: A | Discharge status: Home / Self Care | Payer: Medicare Other | Attending: Internal Medicine | Admitting: Internal Medicine

## 2014-12-29 ENCOUNTER — Emergency Department (HOSPITAL_COMMUNITY): Payer: Medicare Other

## 2014-12-29 ENCOUNTER — Encounter (HOSPITAL_COMMUNITY): Payer: Self-pay

## 2014-12-29 DIAGNOSIS — R0609 Other forms of dyspnea: Secondary | ICD-10-CM | POA: Diagnosis not present

## 2014-12-29 DIAGNOSIS — Z7902 Long term (current) use of antithrombotics/antiplatelets: Secondary | ICD-10-CM

## 2014-12-29 DIAGNOSIS — Z823 Family history of stroke: Secondary | ICD-10-CM

## 2014-12-29 DIAGNOSIS — E785 Hyperlipidemia, unspecified: Secondary | ICD-10-CM | POA: Diagnosis present

## 2014-12-29 DIAGNOSIS — N182 Chronic kidney disease, stage 2 (mild): Secondary | ICD-10-CM | POA: Diagnosis present

## 2014-12-29 DIAGNOSIS — J449 Chronic obstructive pulmonary disease, unspecified: Secondary | ICD-10-CM | POA: Diagnosis present

## 2014-12-29 DIAGNOSIS — Z809 Family history of malignant neoplasm, unspecified: Secondary | ICD-10-CM

## 2014-12-29 DIAGNOSIS — I5033 Acute on chronic diastolic (congestive) heart failure: Principal | ICD-10-CM | POA: Diagnosis present

## 2014-12-29 DIAGNOSIS — I509 Heart failure, unspecified: Secondary | ICD-10-CM

## 2014-12-29 DIAGNOSIS — E039 Hypothyroidism, unspecified: Secondary | ICD-10-CM | POA: Diagnosis present

## 2014-12-29 DIAGNOSIS — N390 Urinary tract infection, site not specified: Secondary | ICD-10-CM | POA: Diagnosis present

## 2014-12-29 DIAGNOSIS — F419 Anxiety disorder, unspecified: Secondary | ICD-10-CM | POA: Diagnosis present

## 2014-12-29 DIAGNOSIS — Z87891 Personal history of nicotine dependence: Secondary | ICD-10-CM | POA: Diagnosis not present

## 2014-12-29 DIAGNOSIS — I129 Hypertensive chronic kidney disease with stage 1 through stage 4 chronic kidney disease, or unspecified chronic kidney disease: Secondary | ICD-10-CM | POA: Diagnosis present

## 2014-12-29 DIAGNOSIS — D649 Anemia, unspecified: Secondary | ICD-10-CM

## 2014-12-29 DIAGNOSIS — E780 Pure hypercholesterolemia: Secondary | ICD-10-CM | POA: Diagnosis present

## 2014-12-29 DIAGNOSIS — D509 Iron deficiency anemia, unspecified: Secondary | ICD-10-CM | POA: Diagnosis present

## 2014-12-29 DIAGNOSIS — R21 Rash and other nonspecific skin eruption: Secondary | ICD-10-CM | POA: Diagnosis present

## 2014-12-29 HISTORY — DX: Peripheral vascular disease, unspecified: I73.9

## 2014-12-29 HISTORY — DX: Disorder of arteries and arterioles, unspecified: I77.9

## 2014-12-29 LAB — URINALYSIS, ROUTINE W REFLEX MICROSCOPIC
BILIRUBIN URINE: NEGATIVE
Glucose, UA: NEGATIVE mg/dL
KETONES UR: NEGATIVE mg/dL
Nitrite: POSITIVE — AB
PH: 5.5 (ref 5.0–8.0)
Protein, ur: 100 mg/dL — AB
UROBILINOGEN UA: 0.2 mg/dL (ref 0.0–1.0)

## 2014-12-29 LAB — URINE MICROSCOPIC-ADD ON

## 2014-12-29 LAB — CBC WITH DIFFERENTIAL/PLATELET
BASOS PCT: 0 % (ref 0–1)
Basophils Absolute: 0 10*3/uL (ref 0.0–0.1)
EOS ABS: 0.2 10*3/uL (ref 0.0–0.7)
Eosinophils Relative: 2 % (ref 0–5)
HCT: 29 % — ABNORMAL LOW (ref 36.0–46.0)
Hemoglobin: 9.1 g/dL — ABNORMAL LOW (ref 12.0–15.0)
Lymphocytes Relative: 19 % (ref 12–46)
Lymphs Abs: 1.3 10*3/uL (ref 0.7–4.0)
MCH: 32.6 pg (ref 26.0–34.0)
MCHC: 31.4 g/dL (ref 30.0–36.0)
MCV: 103.9 fL — ABNORMAL HIGH (ref 78.0–100.0)
MONOS PCT: 4 % (ref 3–12)
Monocytes Absolute: 0.3 10*3/uL (ref 0.1–1.0)
Neutro Abs: 5 10*3/uL (ref 1.7–7.7)
Neutrophils Relative %: 75 % (ref 43–77)
PLATELETS: 161 10*3/uL (ref 150–400)
RBC: 2.79 MIL/uL — ABNORMAL LOW (ref 3.87–5.11)
RDW: 13.5 % (ref 11.5–15.5)
WBC: 6.7 10*3/uL (ref 4.0–10.5)

## 2014-12-29 LAB — BRAIN NATRIURETIC PEPTIDE: B Natriuretic Peptide: 593 pg/mL — ABNORMAL HIGH (ref 0.0–100.0)

## 2014-12-29 LAB — BASIC METABOLIC PANEL
ANION GAP: 3 — AB (ref 5–15)
BUN: 23 mg/dL (ref 6–23)
CALCIUM: 8.1 mg/dL — AB (ref 8.4–10.5)
CO2: 27 mmol/L (ref 19–32)
CREATININE: 1.3 mg/dL — AB (ref 0.50–1.10)
Chloride: 110 mmol/L (ref 96–112)
GFR calc Af Amer: 44 mL/min — ABNORMAL LOW (ref >90)
GFR, EST NON AFRICAN AMERICAN: 38 mL/min — AB (ref >90)
Glucose, Bld: 113 mg/dL — ABNORMAL HIGH (ref 70–99)
Potassium: 4.2 mmol/L (ref 3.5–5.1)
SODIUM: 140 mmol/L (ref 135–145)

## 2014-12-29 LAB — IRON AND TIBC
Iron: 14 ug/dL — ABNORMAL LOW (ref 42–145)
Saturation Ratios: 6 % — ABNORMAL LOW (ref 20–55)
TIBC: 252 ug/dL (ref 250–470)
UIBC: 238 ug/dL (ref 125–400)

## 2014-12-29 LAB — RETICULOCYTES
RBC.: 2.77 MIL/uL — AB (ref 3.87–5.11)
RETIC CT PCT: 1.8 % (ref 0.4–3.1)
Retic Count, Absolute: 49.9 10*3/uL (ref 19.0–186.0)

## 2014-12-29 LAB — TROPONIN I
TROPONIN I: 0.03 ng/mL (ref <0.031)
TROPONIN I: 0.03 ng/mL (ref <0.031)
Troponin I: 0.03 ng/mL (ref <0.031)

## 2014-12-29 LAB — TSH: TSH: 2.84 u[IU]/mL (ref 0.350–4.500)

## 2014-12-29 MED ORDER — FUROSEMIDE 10 MG/ML IJ SOLN: 40.0000 mg | Freq: Two times a day (BID) | INTRAMUSCULAR | Status: DC

## 2014-12-29 MED ORDER — TRAZODONE HCL 50 MG PO TABS
150.0000 mg | ORAL_TABLET | Freq: Every day | ORAL | Status: DC
  Administered 2014-12-29 – 2015-01-01 (×4): 150 mg via ORAL
  Filled 2014-12-29 (×5): qty 3

## 2014-12-29 MED ORDER — LEVOTHYROXINE SODIUM 100 MCG PO TABS
100.0000 ug | ORAL_TABLET | Freq: Every day | ORAL | Status: DC
  Administered 2014-12-30 – 2015-01-02 (×4): 100 ug via ORAL
  Filled 2014-12-29 (×4): qty 1

## 2014-12-29 MED ORDER — CITALOPRAM HYDROBROMIDE 20 MG PO TABS
40.0000 mg | ORAL_TABLET | Freq: Every day | ORAL | Status: DC
  Administered 2014-12-29 – 2015-01-01 (×4): 40 mg via ORAL
  Filled 2014-12-29 (×4): qty 2

## 2014-12-29 MED ORDER — SODIUM CHLORIDE 0.9 % IV SOLN
Freq: Once | INTRAVENOUS | Status: AC
  Administered 2014-12-29: 500 mL via INTRAVENOUS

## 2014-12-29 MED ORDER — FOLIC ACID 1 MG PO TABS
1.0000 mg | ORAL_TABLET | Freq: Every day | ORAL | Status: DC
  Administered 2014-12-29 – 2015-01-02 (×5): 1 mg via ORAL
  Filled 2014-12-29 (×5): qty 1

## 2014-12-29 MED ORDER — ONDANSETRON HCL 4 MG/2ML IJ SOLN: 4.0000 mg | Freq: Four times a day (QID) | INTRAMUSCULAR | Status: DC | PRN

## 2014-12-29 MED ORDER — ASPIRIN 81 MG PO CHEW
324.0000 mg | CHEWABLE_TABLET | Freq: Once | ORAL | Status: AC
  Administered 2014-12-29: 324 mg via ORAL
  Filled 2014-12-29: qty 4

## 2014-12-29 MED ORDER — SODIUM CHLORIDE 0.9 % IJ SOLN
3.0000 mL | Freq: Two times a day (BID) | INTRAMUSCULAR | Status: DC
  Administered 2014-12-29 – 2015-01-01 (×7): 3 mL via INTRAVENOUS

## 2014-12-29 MED ORDER — ATORVASTATIN CALCIUM 20 MG PO TABS
20.0000 mg | ORAL_TABLET | Freq: Every day | ORAL | Status: DC
  Administered 2014-12-29 – 2015-01-02 (×5): 20 mg via ORAL
  Filled 2014-12-29 (×5): qty 1

## 2014-12-29 MED ORDER — IPRATROPIUM-ALBUTEROL 0.5-2.5 (3) MG/3ML IN SOLN
3.0000 mL | Freq: Once | RESPIRATORY_TRACT | Status: AC
  Administered 2014-12-29: 3 mL via RESPIRATORY_TRACT
  Filled 2014-12-29: qty 3

## 2014-12-29 MED ORDER — FUROSEMIDE 10 MG/ML IJ SOLN
20.0000 mg | Freq: Once | INTRAMUSCULAR | Status: AC
  Administered 2014-12-29: 20 mg via INTRAVENOUS
  Filled 2014-12-29: qty 2

## 2014-12-29 MED ORDER — ENOXAPARIN SODIUM 30 MG/0.3ML ~~LOC~~ SOLN
30.0000 mg | SUBCUTANEOUS | Status: DC
  Administered 2014-12-29: 30 mg via SUBCUTANEOUS
  Filled 2014-12-29: qty 0.3

## 2014-12-29 MED ORDER — CIPROFLOXACIN HCL 250 MG PO TABS
250.0000 mg | ORAL_TABLET | Freq: Two times a day (BID) | ORAL | Status: DC
  Administered 2014-12-29 – 2015-01-02 (×8): 250 mg via ORAL
  Filled 2014-12-29 (×8): qty 1

## 2014-12-29 MED ORDER — HEPARIN SODIUM (PORCINE) 5000 UNIT/ML IJ SOLN
5000.0000 [IU] | Freq: Three times a day (TID) | INTRAMUSCULAR | Status: DC
Start: 2014-12-29 — End: 2014-12-29

## 2014-12-29 MED ORDER — ONDANSETRON HCL 4 MG PO TABS
4.0000 mg | ORAL_TABLET | Freq: Four times a day (QID) | ORAL | Status: DC | PRN
  Filled 2014-12-29: qty 1

## 2014-12-29 MED ORDER — METOPROLOL TARTRATE 25 MG PO TABS
25.0000 mg | ORAL_TABLET | Freq: Two times a day (BID) | ORAL | Status: DC
  Administered 2014-12-29 – 2015-01-02 (×8): 25 mg via ORAL
  Filled 2014-12-29 (×8): qty 1

## 2014-12-29 MED ORDER — FUROSEMIDE 10 MG/ML IJ SOLN
40.0000 mg | Freq: Two times a day (BID) | INTRAMUSCULAR | Status: DC
  Administered 2014-12-30 – 2014-12-31 (×3): 40 mg via INTRAVENOUS
  Filled 2014-12-29 (×4): qty 4

## 2014-12-29 MED ORDER — HYDROCODONE-ACETAMINOPHEN 7.5-325 MG PO TABS
1.0000 | ORAL_TABLET | Freq: Three times a day (TID) | ORAL | Status: DC | PRN
  Administered 2014-12-29 – 2015-01-02 (×8): 1 via ORAL
  Filled 2014-12-29 (×9): qty 1

## 2014-12-29 MED ORDER — CLOPIDOGREL BISULFATE 75 MG PO TABS
75.0000 mg | ORAL_TABLET | Freq: Every day | ORAL | Status: DC
  Administered 2014-12-29 – 2015-01-02 (×5): 75 mg via ORAL
  Filled 2014-12-29 (×5): qty 1

## 2014-12-29 MED ORDER — MAGNESIUM SULFATE 2 GM/50ML IV SOLN
2.0000 g | Freq: Once | INTRAVENOUS | Status: AC
  Administered 2014-12-29: 2 g via INTRAVENOUS
  Filled 2014-12-29: qty 50

## 2014-12-29 NOTE — ED Notes (Signed)
Patient ambulated down the hall and to the restroom.  Patient maintained o2 sat of 94-95% while walking and denied any SOB. Patient talking and walking o2 sat was 93-94%.

## 2014-12-29 NOTE — ED Notes (Signed)
Pt alert, oriented, comfortable.  Meal ordered per pt's request.  Pt breathing easier.

## 2014-12-29 NOTE — Progress Notes (Signed)
Patient admitted 317. Rash noted groin area bilateral and back. Red blistery like areas. Patient states rash has been present for some time. Alert and oriented. Telemetry unit applied.

## 2014-12-29 NOTE — ED Notes (Signed)
hospitalist in room with pt 

## 2014-12-29 NOTE — ED Notes (Signed)
Pt reports has COPD and approx 1 1/2 hours ago sob became worse.  EMS administered 02, duoneb, and 125mg  solumedrol.  Pt deneis pain, still SOB.  Pt alert and oriented.

## 2014-12-29 NOTE — H&P (Signed)
[facility] History and Physical  Sierra Singleton JWJ:[id number] DOB: 1933/03/03 DOA: 12/29/2014  Referring physician: ER PCP: Ignatius SpeckingVYAS,DHRUV B., MD   Chief Complaint: Dyspnea  HPI: Sierra KalesShelby J Singleton is a 81 y.o. female  This is a 81 year old lady who has history of COPD and describes dyspnea on exertion for several years but has never been oxygen dependent. Today, this morning, she became short of breath and she tells me that this began more so last evening but got worse today. She denies any cough or fever with it. She was apparently recently hospitalized at [facility] 2 weeks ago with a rash and at that time had breathing difficulties and was admitted to the hospital. This was when she was given clindamycin for a rash that she has that she normally gets Keflex for. It seems that the severe reaction was from clindamycin. We do not have records of this area. The rash is called  Zebedee IbaHaley Haley. She currently denies any chest pain, palpitations, limb weakness. Evaluation in the emergency room is more suggestive of congestive heart failure. She is now being admitted for further management.  Review of Systems:  Apart from symptoms above, all systems negative.  Past Medical History  Diagnosis Date  . Rash     Zebedee IbaHaley Haley rash  . Anxiety   . Thyroid disease   . Hyperlipidemia   . Hypertension   . COPD (chronic obstructive pulmonary disease)   . Poor historian    Past Surgical History  Procedure Laterality Date  . Back surgery     Social History:  reports that she has never smoked. She does not have any smokeless tobacco history on file. She reports that she does not drink alcohol or use illicit drugs.  Allergies  Allergen Reactions  . Sulfa Antibiotics Nausea And Vomiting  . Clindamycin/Lincomycin Swelling and Rash     Family history: No history of heart problems.   Prior to Admission medications   Medication Sig Start Date End Date Taking? Authorizing Provider  atorvastatin  (LIPITOR) 20 MG tablet Take 20 mg by mouth daily.   Yes Historical Provider, MD  citalopram (CELEXA) 40 MG tablet Take 40 mg by mouth daily.   Yes Historical Provider, MD  clopidogrel (PLAVIX) 75 MG tablet Take 75 mg by mouth daily.   Yes Historical Provider, MD  folic acid (FOLVITE) 1 MG tablet Take 1 mg by mouth daily.   Yes Historical Provider, MD  HYDROcodone-acetaminophen (NORCO) 7.5-325 MG per tablet Take 1 tablet by mouth 3 (three) times daily as needed for moderate pain.   Yes Historical Provider, MD  levothyroxine (SYNTHROID, LEVOTHROID) 100 MCG tablet Take 100 mcg by mouth daily before breakfast.   Yes Historical Provider, MD  methotrexate 2.5 MG tablet Take 10 mg by mouth once a week. On Sunday.   Yes Historical Provider, MD  metoprolol tartrate (LOPRESSOR) 25 MG tablet Take 25-50 mg by mouth 2 (two) times daily. 50 mg in the morning and 25 mg at night.   Yes Historical Provider, MD  traZODone (DESYREL) 150 MG tablet Take by mouth at bedtime.   Yes Historical Provider, MD   Physical Exam: Filed Vitals:   12/29/14 1257 12/29/14 1348 12/29/14 1510 12/29/14 1626  BP: 110/58 161/45 166/53 169/47  Pulse: 71 71 68 72  Temp:   98 F (36.7 C)   TempSrc:   Oral   Resp: 22 22 20 21   Height:      Weight:      SpO2:  94% 94% 98% 96%    Wt Readings from Last 3 Encounters:  12/29/14 81.647 kg (180 lb)    General:  Appears somewhat anxious. There is slight increased work of breathing at rest. There is no peripheral or central cyanosis. Eyes: PERRL, normal lids, irises & conjunctiva ENT: grossly normal hearing, lips & tongue Neck: no LAD, masses or thyromegaly Cardiovascular: RRR, no m/r/g. No LE edema. Jugular venous pressure is not elevated. Telemetry: SR, no arrhythmias  Respiratory: Poor air entry bilaterally. There are no crackles, wheezing or bronchial breathing. Abdomen: soft, ntnd Skin: no rash or induration seen on limited exam Musculoskeletal: grossly normal tone  BUE/BLE Psychiatric: grossly normal mood and affect, speech fluent and appropriate Neurologic: grossly non-focal.          Labs on Admission:  Basic Metabolic Panel:  Recent Labs Lab 12/23/14 1115 12/29/14 1258  NA 138 140  K 3.6 4.2  CL 102 110  CO2 30 27  GLUCOSE 104* 113*  BUN 26* 23  CREATININE 1.37* 1.30*  CALCIUM 8.2* 8.1*   Liver Function Tests:  Recent Labs Lab 12/23/14 1115  AST 18  ALT 18  ALKPHOS 50  BILITOT 1.0  PROT 5.7*  ALBUMIN 2.8*   No results for input(s): LIPASE, AMYLASE in the last 168 hours. No results for input(s): AMMONIA in the last 168 hours. CBC:  Recent Labs Lab 12/23/14 1115 12/29/14 1258  WBC 9.6 6.7  NEUTROABS  --  5.0  HGB 11.9* 9.1*  HCT 37.2 29.0*  MCV 102.2* 103.9*  PLT 204 161   Cardiac Enzymes:  Recent Labs Lab 12/29/14 1258  TROPONINI 0.03    BNP (last 3 results) No results for input(s): BNP in the last 8760 hours.  ProBNP (last 3 results) No results for input(s): PROBNP in the last 8760 hours.  CBG: No results for input(s): GLUCAP in the last 168 hours.  Radiological Exams on Admission: Dg Chest Portable 1 View  12/29/2014   CLINICAL DATA:  Shortness of breath for a few hr  EXAM: PORTABLE CHEST - 1 VIEW  COMPARISON:  12/17/2014  FINDINGS: Pulmonary venous congestion with increase in diffuse interstitial opacity. The lungs are hyperinflated suggesting background COPD. Mild cardiomegaly. Aortic and hilar contours are stable from prior.  Unchanged posttraumatic deformity of the proximal right humerus.  IMPRESSION: 1. CHF pattern. 2. Background COPD.   Electronically Signed   By: Tiburcio Pea M.D.   On: 12/29/2014 13:19    EKG: Independently reviewed. Normal sinus rhythm without any acute ST-T wave changes.  Assessment/Plan   1. Possible congestive heart failure, probably diastolic. She will be treated with intravenous diuretics. We will obtain echocardiogram and cardiology consultation. 2. COPD. This may  be contributing to her symptoms but she does not appear to have bronchospasm at the present time. I will not proceed with steroids for the time being. 3. UTI. This is based on a urinalysis. She will be treated with oral antibiotics. 4. Anemia. Check anemia panel. There is no obvious sign of acute GI bleed. 5. Renal insufficiency. Her creatinine appears to be fairly stable and we will monitor this closely.  Further recommendations will depend on patient's hospital progress.   Code Status: Full code.   DVT Prophylaxis: Lovenox  Family Communication: I discussed the plan with the patient at the bedside.  Disposition Plan: Home when medically stable.  Time spent: 60 minutes.  Wilson Singer [facility] Pager [phone].

## 2014-12-29 NOTE — ED Notes (Signed)
Pt states she has COPD. States she is more SOB today than usual

## 2014-12-29 NOTE — ED Notes (Signed)
Pt ate sandwich and chips.

## 2014-12-29 NOTE — ED Notes (Signed)
Pt was given duo neb bu EMS prior to arrival

## 2014-12-29 NOTE — ED Provider Notes (Signed)
CSN: [id number]     Arrival date & time 12/29/14  1218 History  This chart was scribed for Glynn OctaveStephen Freddrick Gladson, MD by Evon Slackerrance Branch, ED Scribe. This patient was seen in room APA11/APA11 and the patient's care was started at 12:27 PM.    Chief Complaint  Patient presents with  . Shortness of Breath   Patient is a 81 y.o. female presenting with shortness of breath. The history is provided by the patient. No language interpreter was used.  Shortness of Breath Associated symptoms: fever   Associated symptoms: no abdominal pain and no chest pain    HPI Comments: Sierra Singleton is a 81 y.o. female with PMHx of Rash (haley haley) and COPD who presents to the Emergency Department complaining of improving SOB onset 2 hours prior. Pt states that her symptoms are from an exasperation of her COPD. Pt states she has subjective fever. Pt states symptoms began while resting watching TV. Pt states that her symptoms improved after receiving a nebulizer treatment from EMS. Pt states she has decreased appetite today.  Pt states she was recently hospitalized at [facility] about 2 weeks prior for rash and had breathing trouble while admitted in the hospital. Denies CP, abdominal pain or other related symptoms.   Denies Hx of CAD, DVT or PE's.    Past Medical History  Diagnosis Date  . Rash     Zebedee IbaHaley Haley rash  . Anxiety   . Thyroid disease   . Hyperlipidemia   . Hypertension   . COPD (chronic obstructive pulmonary disease)   . Poor historian    Past Surgical History  Procedure Laterality Date  . Back surgery     No family history on file. History  Substance Use Topics  . Smoking status: Never Smoker   . Smokeless tobacco: Not on file  . Alcohol Use: No   OB History    No data available      Review of Systems  Constitutional: Positive for fever.  Respiratory: Positive for shortness of breath.   Cardiovascular: Negative for chest pain.  Gastrointestinal: Negative for abdominal pain.  All  other systems reviewed and are negative.    Allergies  Sulfa antibiotics and Clindamycin/lincomycin  Home Medications   Prior to Admission medications   Medication Sig Start Date End Date Taking? Authorizing Provider  atorvastatin (LIPITOR) 20 MG tablet Take 20 mg by mouth daily.   Yes Historical Provider, MD  citalopram (CELEXA) 40 MG tablet Take 40 mg by mouth daily.   Yes Historical Provider, MD  clopidogrel (PLAVIX) 75 MG tablet Take 75 mg by mouth daily.   Yes Historical Provider, MD  folic acid (FOLVITE) 1 MG tablet Take 1 mg by mouth daily.   Yes Historical Provider, MD  HYDROcodone-acetaminophen (NORCO) 7.5-325 MG per tablet Take 1 tablet by mouth 3 (three) times daily as needed for moderate pain.   Yes Historical Provider, MD  levothyroxine (SYNTHROID, LEVOTHROID) 100 MCG tablet Take 100 mcg by mouth daily before breakfast.   Yes Historical Provider, MD  methotrexate 2.5 MG tablet Take 10 mg by mouth once a week. On Sunday.   Yes Historical Provider, MD  metoprolol tartrate (LOPRESSOR) 25 MG tablet Take 25-50 mg by mouth 2 (two) times daily. 50 mg in the morning and 25 mg at night.   Yes Historical Provider, MD  traZODone (DESYREL) 150 MG tablet Take by mouth at bedtime.   Yes Historical Provider, MD   BP 169/47 mmHg  Pulse 72  Temp(Src) 98 F (36.7 C) (Oral)  Resp 21  Ht  (1.651 m)  Wt 180 lb (81.647 kg)  BMI 29.95 kg/m2  SpO2 96%   Physical Exam  Constitutional: She is oriented to person, place, and time. She appears well-developed and well-nourished. No distress.  HENT:  Head: Normocephalic and atraumatic.  Mouth/Throat: Oropharynx is clear and moist. No oropharyngeal exudate.  Eyes: Conjunctivae and EOM are normal. Pupils are equal, round, and reactive to light.  Neck: Normal range of motion. Neck supple.  No meningismus.  Cardiovascular: Normal rate, regular rhythm, normal heart sounds and intact distal pulses.   No murmur heard. Pulmonary/Chest: She has  wheezes.  Increased work of breathing, diminished breath sounds with scattered expiratory wheezing, speaking in short sentences.  Abdominal: Soft. There is no tenderness. There is no rebound and no guarding.  Musculoskeletal: Normal range of motion. She exhibits no edema or tenderness.   No peripheral edema.  Neurological: She is alert and oriented to person, place, and time. No cranial nerve deficit. She exhibits normal muscle tone. Coordination normal.  No ataxia on finger to nose bilaterally. No pronator drift. 5/5 strength throughout. CN 2-12 intact. Negative Romberg. Equal grip strength. Sensation intact. Gait is normal.   Skin: Skin is warm.  Psychiatric: She has a normal mood and affect. Her behavior is normal.  Nursing note and vitals reviewed.   ED Course  Procedures (including critical care time) DIAGNOSTIC STUDIES: Oxygen Saturation is 98% on Hansen 3.5 L, normal by my interpretation.    COORDINATION OF CARE: 12:34 PM-Discussed treatment plan with pt at bedside and pt agreed to plan.     Labs Review Labs Reviewed  CBC WITH DIFFERENTIAL/PLATELET - Abnormal; Notable for the following:    RBC 2.79 (*)    Hemoglobin 9.1 (*)    HCT 29.0 (*)    MCV 103.9 (*)    All other components within normal limits  BASIC METABOLIC PANEL - Abnormal; Notable for the following:    Glucose, Bld 113 (*)    Creatinine, Ser 1.30 (*)    Calcium 8.1 (*)    GFR calc non Af Amer 38 (*)    GFR calc Af Amer 44 (*)    Anion gap 3 (*)    All other components within normal limits  URINALYSIS, ROUTINE W REFLEX MICROSCOPIC - Abnormal; Notable for the following:    APPearance HAZY (*)    Specific Gravity, Urine >1.030 (*)    Hgb urine dipstick TRACE (*)    Protein, ur 100 (*)    Nitrite POSITIVE (*)    Leukocytes, UA TRACE (*)    All other components within normal limits  URINE MICROSCOPIC-ADD ON - Abnormal; Notable for the following:    Squamous Epithelial / LPF FEW (*)    Bacteria, UA MANY (*)     All other components within normal limits  RETICULOCYTES - Abnormal; Notable for the following:    RBC. 2.77 (*)    All other components within normal limits  TROPONIN I  BRAIN NATRIURETIC PEPTIDE  TROPONIN I  TROPONIN I  TROPONIN I  TSH  COMPREHENSIVE METABOLIC PANEL  CBC  VITAMIN B12  FOLATE  IRON AND TIBC  FERRITIN    Imaging Review Dg Chest Portable 1 View  12/29/2014   CLINICAL DATA:  Shortness of breath for a few hr  EXAM: PORTABLE CHEST - 1 VIEW  COMPARISON:  12/17/2014  FINDINGS: Pulmonary venous congestion with increase in diffuse interstitial opacity. The  lungs are hyperinflated suggesting background COPD. Mild cardiomegaly. Aortic and hilar contours are stable from prior.  Unchanged posttraumatic deformity of the proximal right humerus.  IMPRESSION: 1. CHF pattern. 2. Background COPD.   Electronically Signed   By: Tiburcio Pea M.D.   On: 12/29/2014 13:19     EKG Interpretation   Date/Time:  Thursday December 29 2014 12:35:42 EST Ventricular Rate:  69 PR Interval:  138 QRS Duration: 73 QT Interval:  418 QTC Calculation: 448 R Axis:   74 Text Interpretation:  Sinus rhythm Atrial premature complex No significant  change was found Confirmed by Manus Gunning  MD, Deontre Allsup [phone]) on 12/29/2014  12:50:34 PM      MDM   Final diagnoses:  CHF exacerbation   Patient from home with acute onset of shortness of breath 2 hours ago. Feeling improved after DuoNeb inciting Medrol. Denies chest pain. No fever.  Patient with history of COPD. She does not wear oxygen at home.  Diminished breath sounds with scattered respiratory wheezing.  Chest x-ray shows vascular congestion. Patient reportedly had normal echocardiogram at [facility] last month.  On ambulation, patient becomes dyspneic and drops her oxygenation to 93%. IV lasix given.  Dyspneic with ambulation, increased work of breathing at rest.  Admission d/w Dr. Karilyn Cota. Treat UTI.   I personally performed the  services described in this documentation, which was scribed in my presence. The recorded information has been reviewed and is accurate.   Glynn Octave, MD 12/29/14 [phone]

## 2014-12-30 ENCOUNTER — Encounter (HOSPITAL_COMMUNITY): Payer: Self-pay | Admitting: Adult Health

## 2014-12-30 DIAGNOSIS — E785 Hyperlipidemia, unspecified: Secondary | ICD-10-CM

## 2014-12-30 DIAGNOSIS — D509 Iron deficiency anemia, unspecified: Secondary | ICD-10-CM

## 2014-12-30 DIAGNOSIS — Z87898 Personal history of other specified conditions: Secondary | ICD-10-CM

## 2014-12-30 DIAGNOSIS — I10 Essential (primary) hypertension: Secondary | ICD-10-CM

## 2014-12-30 DIAGNOSIS — J438 Other emphysema: Secondary | ICD-10-CM

## 2014-12-30 DIAGNOSIS — I5031 Acute diastolic (congestive) heart failure: Secondary | ICD-10-CM

## 2014-12-30 DIAGNOSIS — I509 Heart failure, unspecified: Secondary | ICD-10-CM

## 2014-12-30 LAB — COMPREHENSIVE METABOLIC PANEL
ALBUMIN: 2.8 g/dL — AB (ref 3.5–5.2)
ALK PHOS: 68 U/L (ref 39–117)
ALT: 17 U/L (ref 0–35)
AST: 20 U/L (ref 0–37)
Anion gap: 7 (ref 5–15)
BUN: 25 mg/dL — AB (ref 6–23)
CO2: 28 mmol/L (ref 19–32)
Calcium: 8.5 mg/dL (ref 8.4–10.5)
Chloride: 105 mmol/L (ref 96–112)
Creatinine, Ser: 1.27 mg/dL — ABNORMAL HIGH (ref 0.50–1.10)
GFR, EST AFRICAN AMERICAN: 45 mL/min — AB (ref >90)
GFR, EST NON AFRICAN AMERICAN: 39 mL/min — AB (ref >90)
GLUCOSE: 171 mg/dL — AB (ref 70–99)
Potassium: 4.6 mmol/L (ref 3.5–5.1)
Sodium: 140 mmol/L (ref 135–145)
TOTAL PROTEIN: 6.3 g/dL (ref 6.0–8.3)
Total Bilirubin: 0.7 mg/dL (ref 0.3–1.2)

## 2014-12-30 LAB — FOLATE: Folate: >20 ng/mL

## 2014-12-30 LAB — CBC
HEMATOCRIT: 30.4 % — AB (ref 36.0–46.0)
Hemoglobin: 9.6 g/dL — ABNORMAL LOW (ref 12.0–15.0)
MCH: 32.3 pg (ref 26.0–34.0)
MCHC: 31.6 g/dL (ref 30.0–36.0)
MCV: 102.4 fL — AB (ref 78.0–100.0)
Platelets: 188 10*3/uL (ref 150–400)
RBC: 2.97 MIL/uL — ABNORMAL LOW (ref 3.87–5.11)
RDW: 13.3 % (ref 11.5–15.5)
WBC: 6 10*3/uL (ref 4.0–10.5)

## 2014-12-30 LAB — VITAMIN B12: Vitamin B-12: 276 pg/mL (ref 211–911)

## 2014-12-30 LAB — TROPONIN I: Troponin I: <0.03 ng/mL (ref <0.031)

## 2014-12-30 LAB — FERRITIN: Ferritin: 138 ng/mL (ref 10–291)

## 2014-12-30 MED ORDER — LORAZEPAM 0.5 MG PO TABS
0.5000 mg | ORAL_TABLET | Freq: Once | ORAL | Status: AC
  Administered 2014-12-30: 0.5 mg via ORAL
  Filled 2014-12-30: qty 1

## 2014-12-30 MED ORDER — ENOXAPARIN SODIUM 40 MG/0.4ML ~~LOC~~ SOLN
40.0000 mg | SUBCUTANEOUS | Status: DC
  Administered 2014-12-30: 40 mg via SUBCUTANEOUS
  Filled 2014-12-30: qty 0.4

## 2014-12-30 NOTE — Progress Notes (Signed)
  Echocardiogram 2D Echocardiogram has been performed.  Zakiah Gauthreaux 12/30/2014, 3:24 PM

## 2014-12-30 NOTE — Care Management Note (Addendum)
    Page 1 of 2   01/02/2015     2:52:41 PM CARE MANAGEMENT NOTE 01/02/2015  Patient:  Sierra Singleton,Sierra Singleton   Account Number:  [id number]  Date Initiated:  12/30/2014  Documentation initiated by:  Sharrie RothmanBLACKWELL,Cambell Stanek C  Subjective/Objective Assessment:   Pt admitted from home with CHF. Pt lives alone and will return home at discharge. Pt is fairly independent with ADL's. Pt has a walker for prn use. Pts cousin lives close by that assist pt as needed.     Action/Plan:   Pt will need home O2 assessment prior to discharge. Pt thinks she is active with [facility]. Will confirm and arrange resumption at discharge. If pt is not active with HH [facility]   Anticipated DC Date:  01/02/2015   Anticipated DC Plan:  HOME W HOME HEALTH SERVICES      DC Planning Services  CM consult      [facility]  Resumption Of Svcs/PTA Provider   Choice offered to / List presented to:  C-1 Patient   DME arranged  OXYGEN  WALKER - ROLLING      DME agency  [facility]     [facility]  HH[facility] RN  HH-10 DISEASE MANAGEMENT      HH agency  [facility]   Status of service:  Completed, signed off Medicare Important Message given?  YES (If response is "NO", the following Medicare IM given date fields will be blank) Date Medicare IM given:  12/30/2014 Medicare IM given by:  Anibal HendersonBOLDEN,GENEVA Date Additional Medicare IM given:  01/02/2015 Additional Medicare IM given by:  Sharrie RothmanAMMY C Brantlee Penn  [facility]  Per UR Regulation:    If discussed at Long Length of Stay Meetings, dates discussed:    Comments:  01/02/15 1430 Arlyss Queenammy Aahna Rossa, RN BSN CM Pt discharged home today [facility] (per pts choice). Alroy BailiffLinda Lothian of [facility] is aware and will collect the pts information from the chart. HH services to resume within 48 hours of discharge. Pt also qualifies for home O2 and wants a rolling walker [facility]. Kara Meadmma will deliver pts O2 and  rolling walker to room. Pt and pts nurse aware of discharge arrangements.  12/30/14 1600 Arlyss Queenammy Cleopha Indelicato, RN BSN CM

## 2014-12-30 NOTE — Progress Notes (Addendum)
[facility] PROGRESS NOTE  Sierra Singleton ZOX:[id number] DOB: 1933/09/29 DOA: 12/29/2014 PCP: Sierra Specking., MD Interim summary: 81 y.o. female prior h/o COPD, comes in for dyspnea on exertion. Her BNP was elevated and she was admitted for mild CHF exacerbation.   Assessment/Plan: 1. Acute on chronic diastolic heart failure: Admitted to telemetry and her echocardiogram reveals LVEF OF 60% and grade 2 diastolic dysfunction, . She was started on IV lasix for diuresis and cardiology consulted. Resume metoprolol and plavix.    COPD: no wheezing heard.    Hypothyroidism: resume synthroid. TSH within normal limits.   Urinary tract infection: - started on ciprofloxacin. Unfortunately, urine cultures not sent on admission.   Stage 2 CKD:  Appears to be at baseline . Continue to monitor on lasix.    Code Status: full code.  Family Communication: none at bedside Disposition Plan: plan for discharge home when appropriately diuresed.    Consultants: cardiology Procedures:  Echocardiogram.   Antibiotics:  cipro  HPI/Subjective: Feels better.   Objective: Filed Vitals:   12/30/14 0549  BP: 120/94  Pulse: 64  Temp: 97.6 F (36.4 C)  Resp: 20    Intake/Output Summary (Last 24 hours) at 12/30/14 1639 Last data filed at 12/30/14 1115  Gross per 24 hour  Intake    723 ml  Output   2000 ml  Net  -1277 ml   Filed Weights   12/29/14 1222 12/30/14 0549  Weight: 81.647 kg (180 lb) 82.3 kg (181 lb 7 oz)    Exam:   General:  Alert comfortable laying in bed  Cardiovascular: s1s2  Respiratory: clear to ausculation no wheezing heard  Abdomen: soft non tender non distended bowel sounds heard  Musculoskeletal: trace edema.   Neuro: alert and oriented .   Data Reviewed: Basic Metabolic Panel:  Recent Labs Lab 12/29/14 1258 12/30/14 0646  NA 140 140  K 4.2 4.6  CL 110 105  CO2 27 28  GLUCOSE 113* 171*  BUN 23 25*  CREATININE 1.30* 1.27*  CALCIUM 8.1*  8.5   Liver Function Tests:  Recent Labs Lab 12/30/14 0646  AST 20  ALT 17  ALKPHOS 68  BILITOT 0.7  PROT 6.3  ALBUMIN 2.8*   No results for input(s): LIPASE, AMYLASE in the last 168 hours. No results for input(s): AMMONIA in the last 168 hours. CBC:  Recent Labs Lab 12/29/14 1258 12/30/14 0646  WBC 6.7 6.0  NEUTROABS 5.0  --   HGB 9.1* 9.6*  HCT 29.0* 30.4*  MCV 103.9* 102.4*  PLT 161 188   Cardiac Enzymes:  Recent Labs Lab 12/29/14 1258 12/29/14 1717 12/29/14 2225 12/30/14 0646  TROPONINI 0.03 0.03 0.03 <0.03   BNP (last 3 results)  Recent Labs  12/29/14 1258  BNP 593.0*    ProBNP (last 3 results) No results for input(s): PROBNP in the last 8760 hours.  CBG: No results for input(s): GLUCAP in the last 168 hours.  No results found for this or any previous visit (from the past 240 hour(s)).   Studies: Dg Chest Portable 1 View  12/29/2014   CLINICAL DATA:  Shortness of breath for a few hr  EXAM: PORTABLE CHEST - 1 VIEW  COMPARISON:  12/17/2014  FINDINGS: Pulmonary venous congestion with increase in diffuse interstitial opacity. The lungs are hyperinflated suggesting background COPD. Mild cardiomegaly. Aortic and hilar contours are stable from prior.  Unchanged posttraumatic deformity of the proximal right humerus.  IMPRESSION: 1. CHF pattern. 2. Background COPD.  Electronically Signed   By: Sierra PeaJonathan  Watts M.D.   On: 12/29/2014 13:19    Scheduled Meds: . atorvastatin  20 mg Oral Daily  . ciprofloxacin  250 mg Oral BID  . citalopram  40 mg Oral Daily  . clopidogrel  75 mg Oral Daily  . enoxaparin (LOVENOX) injection  40 mg Subcutaneous Q24H  . folic acid  1 mg Oral Daily  . furosemide  40 mg Intravenous Q12H  . levothyroxine  100 mcg Oral QAC breakfast  . metoprolol tartrate  25 mg Oral BID  . sodium chloride  3 mL Intravenous Q12H  . traZODone  150 mg Oral QHS   Continuous Infusions:   Active Problems:   CHF exacerbation   COPD (chronic  obstructive pulmonary disease)   Congestive heart failure    Time spent: 15 minutes    Sierra Singleton  [facility] Pager [phone]. If 7PM-7AM, please contact night-coverage at [email or web address], password [email or web address] 12/30/2014, 4:39 PM  LOS: 1 day

## 2014-12-30 NOTE — Consult Note (Signed)
CARDIOLOGY CONSULT NOTE   Patient ID: Sierra Singleton MRN: [id number] DOB/AGE: Feb 08, 1933 81 y.o.  Admit Date: 12/29/2014 Referring Physician: PTH Primary Physician: Ignatius Specking., MD Consulting Cardiologist: Berdie Ogren Primary Cardiologist: Charlton Haws MD Reason for Consultation: CHF  Clinical Summary Sierra Singleton is a 81 y.o.female patient with known history of Hailey-Hailey skin disease, hypertension, COPD, hyperlipidemia, and anxiety. She was in her usual state of health being followed by Dr. Sherril Croon and NP for skin disease. She was being treated with Keflex but continued to have eruptions. She was switched to clindamycin and had an allergic reaction to it causing severe blistering and LEE. She was sent to [facility] where she was treated. She had exacerbation of COPD according to patient. She spent 5 days there.      She returned home this week. Follow up lab work completed by Dr. Sherril Croon apparently demonstrated that she was hyperkalemic. He asked her to go back to the hospital. She refused to go back to [place] and came to [facility]. Labs were redrawn and she was found to have potassium of 4.2. However she remained short of breath with mild edema. CXR demonstrated CHF. We are asked to evaluate further.     On arrival to ER BP was 174/54 HR 71 O2 sat 98%. She was anemic with Hgb of 9.1, Hct 29.0  with anemia profile iron deficiency with low iron saturations.  She was treated with duoneb, magnesium 2 gm, lasix 20 mg IV x 1, lasix 40 mg IV. Admitted for further treatment. She has diuresed 1.277. With diureses Hgb increased to 9.6.   She is feeling better but has some complaints of restless legs and continued issues with breathing.    Allergies  Allergen Reactions  . Sulfa Antibiotics Nausea And Vomiting  . Clindamycin/Lincomycin Swelling and Rash    Medications Scheduled Medications: . atorvastatin  20 mg Oral Daily  . ciprofloxacin  250 mg Oral BID  . citalopram  40 mg Oral  Daily  . clopidogrel  75 mg Oral Daily  . enoxaparin (LOVENOX) injection  40 mg Subcutaneous Q24H  . folic acid  1 mg Oral Daily  . furosemide  40 mg Intravenous Q12H  . levothyroxine  100 mcg Oral QAC breakfast  . metoprolol tartrate  25 mg Oral BID  . sodium chloride  3 mL Intravenous Q12H  . traZODone  150 mg Oral QHS      PRN Medications: HYDROcodone-acetaminophen, ondansetron **OR** ondansetron (ZOFRAN) IV   Past Medical History  Diagnosis Date  . Rash     [name] rash  . Anxiety   . Thyroid disease   . Hyperlipidemia   . Hypertension   . COPD (chronic obstructive pulmonary disease)   . Poor historian     Past Surgical History  Procedure Laterality Date  . Back surgery      Family History  Problem Relation Age of Onset  . Cancer Father   . Stroke Mother     Social History Sierra Singleton reports that she has never smoked. She does not have any smokeless tobacco history on file. Sierra Singleton reports that she does not drink alcohol.  Review of Systems Complete review of systems are found to be negative unless outlined in H&P above.  Physical Examination Blood pressure 120/94, pulse 64, temperature 97.6 F (36.4 C), temperature source Oral, resp. rate 20, height  (1.651 m), weight 181 lb 7 oz (82.3 kg), SpO2 100 %.  Intake/Output Summary (Last  24 hours) at 12/30/14 1258 Last data filed at 12/30/14 1115  Gross per 24 hour  Intake    723 ml  Output   2000 ml  Net  -1277 ml    Telemetry: Sinus rhythm 60's and 70's.   GEN: Anxious but in no distress. HEENT: Conjunctiva and lids normal, oropharynx clear with moist mucosa. Neck: Supple, no elevated JVP or carotid bruits, no thyromegaly. Lungs: Mild bibasilar crackles without wheezes.  Cardiac: Regular rate and rhythm, no S3 or significant systolic murmur, no pericardial rub. Abdomen: Soft, nontender, no hepatomegaly, bowel sounds present, no guarding or rebound. Extremities: Mild non- pitting edema,  distal pulses 2+. Skin: Warm and dry. Musculoskeletal: No kyphosis. Neuropsychiatric: Alert and oriented x3, affect grossly appropriate.  Prior Cardiac Testing/Procedures Echocardiogram 05/10/2009 1. Left ventricle: Systolic function was normal. The estimated  ejection fraction was in the range of 60% to 65%. Features are  consistent with a pseudonormal left ventricular filling pattern,  with concomitant abnormal relaxation and increased filling  pressure (grade 2 diastolic dysfunction). 2. Left atrium: The atrium was mildly dilated. 3. Pulmonary arteries: PA peak pressure: 39mm Hg (S).  Carotid Ultrasound 01/13/2008 1. Bilateral carotid bifurcation atherosclerosis, worse on the right.  2. Right ICA stenosis within the 50-69% range, closer to 69% because of the elevated left ICA velocity. This could be further evaluated with dedicated CTA or conventional angiography.  3. Left ICA stenosis within the 0-50% range, status post left endarterectomy.  4. Patent retrograde flow in the right vertebral artery concerning for right subclavian steal syndrome.  5. Patent antegrade flow in the left vertebral artery.  NM Stress Test 01/13/2008 IMPRESSION:  Low risk Myoview, poor exercise tolerance. Apical thinning with no evidence of ischemia or infarction. Gated ejection fraction was 68%.  Lab Results  Basic Metabolic Panel:  Recent Labs Lab 12/29/14 1258 12/30/14 0646  NA 140 140  K 4.2 4.6  CL 110 105  CO2 27 28  GLUCOSE 113* 171*  BUN 23 25*  CREATININE 1.30* 1.27*  CALCIUM 8.1* 8.5    Liver Function Tests:  Recent Labs Lab 12/30/14 0646  AST 20  ALT 17  ALKPHOS 68  BILITOT 0.7  PROT 6.3  ALBUMIN 2.8*    CBC:  Recent Labs Lab 12/29/14 1258 12/30/14 0646  WBC 6.7 6.0  NEUTROABS 5.0  --   HGB 9.1* 9.6*  HCT 29.0* 30.4*  MCV 103.9* 102.4*  PLT 161 188    Cardiac Enzymes:  Recent Labs Lab 12/29/14 1258 12/29/14 1717 12/29/14 2225  12/30/14 0646  TROPONINI 0.03 0.03 0.03 <0.03    Radiology: Dg Chest Portable 1 View  12/29/2014   CLINICAL DATA:  Shortness of breath for a few hr  EXAM: PORTABLE CHEST - 1 VIEW  COMPARISON:  12/17/2014  FINDINGS: Pulmonary venous congestion with increase in diffuse interstitial opacity. The lungs are hyperinflated suggesting background COPD. Mild cardiomegaly. Aortic and hilar contours are stable from prior.  Unchanged posttraumatic deformity of the proximal right humerus.  IMPRESSION: 1. CHF pattern. 2. Background COPD.   Electronically Signed   By: Tiburcio Pea M.D.   On: 12/29/2014 13:19     ECG: NSR rate of 69 bpm with PAC.    Impression and Recommendations  1. CHF: Will get records from  [facility] for recent echo and studies. She is breathing better but is still O2 dependent. She coughing on occasion. She has diuresed 1.2 liters without significant edema on assessment but continues some non-pitting edema. Continue lasxi  40 mg IV BID for another 24 hours. Transition to po on Sunday, 40 mg daily. Creatinine 1.27. Troponin is negative X 3 arguing against ACS as etiology of CHF.  2. Hypertension: BP is well controlled currently on regimen. She is on metoprolol 25 mg BID.   3. COPD: Continues treatment with O2 and cipro.  4. Hypercholesterolemia; Continue statin  5. Iron Deficiency Anemia: On folic acid..    SignedBettey Mare: Kathryn M. Lawrence NP [facility]  12/30/2014, 12:58 PM Co-Sign MD  The patient was seen and examined, and I agree with the assessment and plan as documented above, with modifications as noted below. Pt with aforementioned history admitted with exertional dyspnea with radiographic evidence for CHF, presumed diastolic in etiology given grade 2 diastolic dysfunction seen by echo in 6/10. Has diuresed 1.2 L on IV Lasix. Would continue for at least another 24 hours and then transition to oral diuretics. [facility]  including any studies (echo) that were done. She does have COPD and quit smoking over 25 years ago. Has ruled out for ACS with serial troponins, and ECG is without acute ischemic changes. BP well controlled at present. Denies chest pain. Will continue to monitor.

## 2014-12-30 NOTE — Progress Notes (Signed)
UR chart review completed.  

## 2014-12-31 LAB — BASIC METABOLIC PANEL
Anion gap: 6 (ref 5–15)
BUN: 26 mg/dL — ABNORMAL HIGH (ref 6–23)
CO2: 33 mmol/L — AB (ref 19–32)
CREATININE: 1.61 mg/dL — AB (ref 0.50–1.10)
Calcium: 8.7 mg/dL (ref 8.4–10.5)
Chloride: 103 mmol/L (ref 96–112)
GFR, EST AFRICAN AMERICAN: 34 mL/min — AB (ref >90)
GFR, EST NON AFRICAN AMERICAN: 29 mL/min — AB (ref >90)
GLUCOSE: 91 mg/dL (ref 70–99)
POTASSIUM: 4.2 mmol/L (ref 3.5–5.1)
Sodium: 142 mmol/L (ref 135–145)

## 2014-12-31 MED ORDER — FUROSEMIDE 10 MG/ML IJ SOLN
40.0000 mg | Freq: Every day | INTRAMUSCULAR | Status: DC
  Administered 2015-01-01: 40 mg via INTRAVENOUS
  Filled 2014-12-31: qty 4

## 2014-12-31 MED ORDER — ENOXAPARIN SODIUM 30 MG/0.3ML ~~LOC~~ SOLN
30.0000 mg | SUBCUTANEOUS | Status: DC
  Administered 2014-12-31 – 2015-01-01 (×2): 30 mg via SUBCUTANEOUS
  Filled 2014-12-31 (×2): qty 0.3

## 2014-12-31 NOTE — Progress Notes (Signed)
[facility] PROGRESS NOTE  Sierra KalesShelby J Singleton ZOX:[id number] DOB: Mar 30, 1933 DOA: 12/29/2014 PCP: Ignatius SpeckingVYAS,DHRUV B., MD Interim summary: 81 y.o. female prior h/o COPD, comes in for dyspnea on exertion. Her BNP was elevated and she was admitted for mild CHF exacerbation.   Assessment/Plan: 1. Acute on chronic diastolic heart failure: Admitted to telemetry and her echocardiogram reveals LVEF OF 60% and grade 2 diastolic dysfunction, . She was started on IV lasix for diuresis and cardiology consulted. Resume metoprolol and plavix. As her renal function worsened a little, we have cut down the lasix BID to daily . Repeat renal parameters in am.    COPD: no wheezing heard.    Hypothyroidism: resume synthroid. TSH within normal limits.   Urinary tract infection: - started on ciprofloxacin. Unfortunately, urine cultures not sent on admission.    slight worsening of Stage 2 CKD:   decreased the dose of lasix to 40 mg daily.    Code Status: full code.  Family Communication: none at bedside Disposition Plan: plan for discharge home when appropriately diuresed.    Consultants: cardiology Procedures:  Echocardiogram.   Antibiotics:  cipro  HPI/Subjective: Feels better.   Objective: Filed Vitals:   12/31/14 1655  BP: 127/55  Pulse: 62  Temp:   Resp:     Intake/Output Summary (Last 24 hours) at 12/31/14 1658 Last data filed at 12/31/14 1025  Gross per 24 hour  Intake    363 ml  Output   1925 ml  Net  -1562 ml   Filed Weights   12/29/14 1222 12/30/14 0549 12/31/14 0530  Weight: 81.647 kg (180 lb) 82.3 kg (181 lb 7 oz) 80.3 kg (177 lb 0.5 oz)    Exam:   General:  Alert comfortable laying in bed  Cardiovascular: s1s2  Respiratory: clear to ausculation no wheezing heard  Abdomen: soft non tender non distended bowel sounds heard  Musculoskeletal: trace edema.   Neuro: alert and oriented .   Data Reviewed: Basic Metabolic Panel:  Recent Labs Lab  12/29/14 1258 12/30/14 0646 12/31/14 0801  NA 140 140 142  K 4.2 4.6 4.2  CL 110 105 103  CO2 27 28 33*  GLUCOSE 113* 171* 91  BUN 23 25* 26*  CREATININE 1.30* 1.27* 1.61*  CALCIUM 8.1* 8.5 8.7   Liver Function Tests:  Recent Labs Lab 12/30/14 0646  AST 20  ALT 17  ALKPHOS 68  BILITOT 0.7  PROT 6.3  ALBUMIN 2.8*   No results for input(s): LIPASE, AMYLASE in the last 168 hours. No results for input(s): AMMONIA in the last 168 hours. CBC:  Recent Labs Lab 12/29/14 1258 12/30/14 0646  WBC 6.7 6.0  NEUTROABS 5.0  --   HGB 9.1* 9.6*  HCT 29.0* 30.4*  MCV 103.9* 102.4*  PLT 161 188   Cardiac Enzymes:  Recent Labs Lab 12/29/14 1258 12/29/14 1717 12/29/14 2225 12/30/14 0646  TROPONINI 0.03 0.03 0.03 <0.03   BNP (last 3 results)  Recent Labs  12/29/14 1258  BNP 593.0*    ProBNP (last 3 results) No results for input(s): PROBNP in the last 8760 hours.  CBG: No results for input(s): GLUCAP in the last 168 hours.  No results found for this or any previous visit (from the past 240 hour(s)).   Studies: No results found.  Scheduled Meds: . atorvastatin  20 mg Oral Daily  . ciprofloxacin  250 mg Oral BID  . citalopram  40 mg Oral Daily  . clopidogrel  75 mg Oral Daily  .  enoxaparin (LOVENOX) injection  30 mg Subcutaneous Q24H  . folic acid  1 mg Oral Daily  . [START ON 01/01/2015] furosemide  40 mg Intravenous Daily  . levothyroxine  100 mcg Oral QAC breakfast  . metoprolol tartrate  25 mg Oral BID  . sodium chloride  3 mL Intravenous Q12H  . traZODone  150 mg Oral QHS   Continuous Infusions:   Active Problems:   CHF exacerbation   COPD (chronic obstructive pulmonary disease)   Congestive heart failure    Time spent: 15 minutes    Sierra Singleton  [facility] Pager [phone]. If 7PM-7AM, please contact night-coverage at [email or web address], password [facility] 12/31/2014, 4:58 PM  LOS: 2 days

## 2015-01-01 LAB — BASIC METABOLIC PANEL
Anion gap: 6 (ref 5–15)
BUN: 29 mg/dL — AB (ref 6–23)
CALCIUM: 8.3 mg/dL — AB (ref 8.4–10.5)
CHLORIDE: 102 mmol/L (ref 96–112)
CO2: 33 mmol/L — ABNORMAL HIGH (ref 19–32)
CREATININE: 1.54 mg/dL — AB (ref 0.50–1.10)
GFR calc Af Amer: 36 mL/min — ABNORMAL LOW (ref >90)
GFR calc non Af Amer: 31 mL/min — ABNORMAL LOW (ref >90)
Glucose, Bld: 91 mg/dL (ref 70–99)
Potassium: 4.1 mmol/L (ref 3.5–5.1)
SODIUM: 141 mmol/L (ref 135–145)

## 2015-01-01 MED ORDER — CITALOPRAM HYDROBROMIDE 20 MG PO TABS
20.0000 mg | ORAL_TABLET | Freq: Every day | ORAL | Status: DC
  Administered 2015-01-02: 20 mg via ORAL
  Filled 2015-01-01: qty 1

## 2015-01-01 NOTE — Progress Notes (Signed)
[facility] PROGRESS NOTE  Sierra KalesShelby J Singleton ZOX:[id number] DOB: 28-Apr-1933 DOA: 12/29/2014 PCP: Sierra Singleton Interim summary: 81 y.o. female prior h/o COPD, comes in for dyspnea on exertion. Her BNP was elevated and she was admitted for mild CHF exacerbation.   Assessment/Plan: 1. Acute on chronic diastolic heart failure: Admitted to telemetry and her echocardiogram reveals LVEF OF 60% and grade 2 diastolic dysfunction, . She was started on IV lasix for diuresis and cardiology consulted. Resume metoprolol and plavix. As her renal function worsened a little, we have cut down the lasix BID to daily . Repeat renal parameters in am show slight improvement.    COPD: no wheezing heard.    Hypothyroidism: resume synthroid. TSH within normal limits.   Urinary tract infection: - started on ciprofloxacin. Unfortunately, urine cultures not sent on admission.    slight worsening of Stage 2 CKD:   decreased the dose of lasix to 40 mg daily.    Code Status: full code.  Family Communication: none at bedside Disposition Plan: plan for discharge home when appropriately diuresed.    Consultants: cardiology Procedures:  Echocardiogram.   Antibiotics:  cipro  HPI/Subjective: Feels better. Breathing better, no chest pain or sob.   Objective: Filed Vitals:   01/01/15 1319  BP: 91/51  Pulse: 59  Temp: 98.5 F (36.9 C)  Resp:     Intake/Output Summary (Last 24 hours) at 01/01/15 1327 Last data filed at 01/01/15 1019  Gross per 24 hour  Intake    480 ml  Output    700 ml  Net   -220 ml   Filed Weights   12/30/14 0549 12/31/14 0530 01/01/15 0609  Weight: 82.3 kg (181 lb 7 oz) 80.3 kg (177 lb 0.5 oz) 97 kg (213 lb 13.5 oz)    Exam:   General:  Alert comfortable laying in bed  Cardiovascular: s1s2  Respiratory: clear to ausculation no wheezing heard.  Abdomen: soft non tender non distended bowel sounds heard  Musculoskeletal: trace edema.   Neuro: alert and  oriented . No focal deficits.   Data Reviewed: Basic Metabolic Panel:  Recent Labs Lab 12/29/14 1258 12/30/14 0646 12/31/14 0801 01/01/15 0544  NA 140 140 142 141  K 4.2 4.6 4.2 4.1  CL 110 105 103 102  CO2 27 28 33* 33*  GLUCOSE 113* 171* 91 91  BUN 23 25* 26* 29*  CREATININE 1.30* 1.27* 1.61* 1.54*  CALCIUM 8.1* 8.5 8.7 8.3*   Liver Function Tests:  Recent Labs Lab 12/30/14 0646  AST 20  ALT 17  ALKPHOS 68  BILITOT 0.7  PROT 6.3  ALBUMIN 2.8*   No results for input(s): LIPASE, AMYLASE in the last 168 hours. No results for input(s): AMMONIA in the last 168 hours. CBC:  Recent Labs Lab 12/29/14 1258 12/30/14 0646  WBC 6.7 6.0  NEUTROABS 5.0  --   HGB 9.1* 9.6*  HCT 29.0* 30.4*  MCV 103.9* 102.4*  PLT 161 188   Cardiac Enzymes:  Recent Labs Lab 12/29/14 1258 12/29/14 1717 12/29/14 2225 12/30/14 0646  TROPONINI 0.03 0.03 0.03 <0.03   BNP (last 3 results)  Recent Labs  12/29/14 1258  BNP 593.0*    ProBNP (last 3 results) No results for input(s): PROBNP in the last 8760 hours.  CBG: No results for input(s): GLUCAP in the last 168 hours.  No results found for this or any previous visit (from the past 240 hour(s)).   Studies: No results found.  Scheduled Meds: .  atorvastatin  20 mg Oral Daily  . ciprofloxacin  250 mg Oral BID  . [START ON 01/02/2015] citalopram  20 mg Oral Daily  . clopidogrel  75 mg Oral Daily  . enoxaparin (LOVENOX) injection  30 mg Subcutaneous Q24H  . folic acid  1 mg Oral Daily  . furosemide  40 mg Intravenous Daily  . levothyroxine  100 mcg Oral QAC breakfast  . metoprolol tartrate  25 mg Oral BID  . sodium chloride  3 mL Intravenous Q12H  . traZODone  150 mg Oral QHS   Continuous Infusions:   Active Problems:   CHF exacerbation   COPD (chronic obstructive pulmonary disease)   Congestive heart failure    Time spent: 15 minutes    Sierra Singleton  [facility] Pager [phone]. If 7PM-7AM, please  contact night-coverage at [email or web address], password [facility] 01/01/2015, 1:27 PM  LOS: 3 days

## 2015-01-02 DIAGNOSIS — I5033 Acute on chronic diastolic (congestive) heart failure: Principal | ICD-10-CM

## 2015-01-02 MED ORDER — FUROSEMIDE 40 MG PO TABS: 40.0000 mg | ORAL_TABLET | Freq: Every day | ORAL | Status: DC

## 2015-01-02 MED ORDER — FUROSEMIDE 40 MG PO TABS
40.0000 mg | ORAL_TABLET | Freq: Every day | ORAL | Status: DC
  Administered 2015-01-02: 40 mg via ORAL
  Filled 2015-01-02: qty 1

## 2015-01-02 MED ORDER — HYDROCODONE-ACETAMINOPHEN 7.5-325 MG PO TABS: 1.0000 | ORAL_TABLET | Freq: Three times a day (TID) | ORAL | Status: DC | PRN

## 2015-01-02 MED ORDER — ENOXAPARIN SODIUM 40 MG/0.4ML ~~LOC~~ SOLN: 40.0000 mg | SUBCUTANEOUS | Status: DC

## 2015-01-02 MED ORDER — METOPROLOL TARTRATE 25 MG PO TABS: 25.0000 mg | ORAL_TABLET | Freq: Two times a day (BID) | ORAL | Status: DC

## 2015-01-02 NOTE — Discharge Summary (Signed)
Physician Discharge Summary  Sierra Singleton [id number] DOB: 14-Dec-1932 DOA: 12/29/2014  PCP: Sierra SpeckingVYAS,Sierra B., MD  Admit date: 12/29/2014 Discharge date: 01/02/2015  Time spent: 25 minutes  Recommendations for Outpatient Follow-up:  1. Follow up with your cardiologist as recommended.  2. Follow up with your PCP in 2 weeks.  3. Recommend checking renal function in 1 week.   Discharge Diagnoses:  Active Problems:   CHF exacerbation   COPD (chronic obstructive pulmonary disease)   Congestive heart failure   Discharge Condition: improved  Diet recommendation: low sodium diet.  Filed Weights   12/31/14 0530 01/01/15 0609 01/02/15 0525  Weight: 80.3 kg (177 lb 0.5 oz) 97 kg (213 lb 13.5 oz) 81.6 kg (179 lb 14.3 oz)    History of present illness:  81 y.o. female prior h/o COPD, comes in for dyspnea on exertion. Her BNP was elevated and she was admitted for mild CHF exacerbation.   Hospital Course:  1. Acute on chronic diastolic heart failure: Admitted to telemetry and her echocardiogram reveals LVEF OF 60% and grade 2 diastolic dysfunction, . She was started on IV lasix for diuresis and cardiology consulted. Resume metoprolol and plavix. As her renal function worsened a little, we have cut down the lasix BID to daily . Repeat renal parameters in am show slight improvement. She was discharged on daily lasix and recommended to get a bmp in one week. Her symptoms of dyspnea have improved.   COPD: no wheezing heard.    Hypothyroidism: resume synthroid. TSH within normal limits.   Urinary tract infection: - started on ciprofloxacin. Unfortunately, urine cultures not sent on admission. She completed 5 days of ciprofloxacin.   slight worsening of Stage 2 CKD:  decreased the dose of lasix to 40 mg daily and her renal function stabilized. Recommend watching her renal function on discharge.     Procedures:  echocardiogram  Consultations:  cardiology  Discharge Exam: Filed  Vitals:   01/02/15 0525  BP: 116/51  Pulse: 58  Temp: 98.4 F (36.9 C)  Resp: 20    General: alert afebrile comfortable Cardiovascular: s1s2 Respiratory: ctab  Discharge Instructions   Discharge Instructions    (HEART FAILURE PATIENTS) Call MD:  Anytime you have any of the following symptoms: 1) 3 pound weight gain in 24 hours or 5 pounds in 1 week 2) shortness of breath, with or without a dry hacking cough 3) swelling in the hands, feet or stomach 4) if you have to sleep on extra pillows at night in order to breathe.    Complete by:  As directed      Diet - low sodium heart healthy    Complete by:  As directed      Discharge instructions    Complete by:  As directed   Follow up with PCP in 2 weeks. Post hospitalization visit.          Current Discharge Medication List    START taking these medications   Details  furosemide (LASIX) 40 MG tablet Take 1 tablet (40 mg total) by mouth daily. Qty: 30 tablet, Refills: 1      CONTINUE these medications which have CHANGED   Details  HYDROcodone-acetaminophen (NORCO) 7.5-325 MG per tablet Take 1 tablet by mouth 3 (three) times daily as needed for moderate pain. Qty: 30 tablet, Refills: 0    metoprolol tartrate (LOPRESSOR) 25 MG tablet Take 1 tablet (25 mg total) by mouth 2 (two) times daily. Qty: 60 tablet, Refills: 1  CONTINUE these medications which have NOT CHANGED   Details  atorvastatin (LIPITOR) 20 MG tablet Take 20 mg by mouth daily.    citalopram (CELEXA) 40 MG tablet Take 40 mg by mouth daily.    clopidogrel (PLAVIX) 75 MG tablet Take 75 mg by mouth daily.    folic acid (FOLVITE) 1 MG tablet Take 1 mg by mouth daily.    levothyroxine (SYNTHROID, LEVOTHROID) 100 MCG tablet Take 100 mcg by mouth daily before breakfast.    methotrexate 2.5 MG tablet Take 10 mg by mouth once a week. On Sunday.    traZODone (DESYREL) 150 MG tablet Take by mouth at bedtime.       Allergies  Allergen Reactions  . Ambien  [Zolpidem Tartrate]   . Sulfa Antibiotics Nausea And Vomiting  . Clindamycin/Lincomycin Swelling and Rash   Follow-up Information    Follow up with [facility].   Contact information:   [address] [place] [address] [phone]        The results of significant diagnostics from this hospitalization (including imaging, microbiology, ancillary and laboratory) are listed below for reference.    Significant Diagnostic Studies: Dg Chest Portable 1 View  12/29/2014   CLINICAL DATA:  Shortness of breath for a few hr  EXAM: PORTABLE CHEST - 1 VIEW  COMPARISON:  12/17/2014  FINDINGS: Pulmonary venous congestion with increase in diffuse interstitial opacity. The lungs are hyperinflated suggesting background COPD. Mild cardiomegaly. Aortic and hilar contours are stable from prior.  Unchanged posttraumatic deformity of the proximal right humerus.  IMPRESSION: 1. CHF pattern. 2. Background COPD.   Electronically Signed   By: Tiburcio Pea M.D.   On: 12/29/2014 13:19    Microbiology: No results found for this or any previous visit (from the past 240 hour(s)).   Labs: Basic Metabolic Panel:  Recent Labs Lab 12/29/14 1258 12/30/14 0646 12/31/14 0801 01/01/15 0544  NA 140 140 142 141  K 4.2 4.6 4.2 4.1  CL 110 105 103 102  CO2 27 28 33* 33*  GLUCOSE 113* 171* 91 91  BUN 23 25* 26* 29*  CREATININE 1.30* 1.27* 1.61* 1.54*  CALCIUM 8.1* 8.5 8.7 8.3*   Liver Function Tests:  Recent Labs Lab 12/30/14 0646  AST 20  ALT 17  ALKPHOS 68  BILITOT 0.7  PROT 6.3  ALBUMIN 2.8*   No results for input(s): LIPASE, AMYLASE in the last 168 hours. No results for input(s): AMMONIA in the last 168 hours. CBC:  Recent Labs Lab 12/29/14 1258 12/30/14 0646  WBC 6.7 6.0  NEUTROABS 5.0  --   HGB 9.1* 9.6*  HCT 29.0* 30.4*  MCV 103.9* 102.4*  PLT 161 188   Cardiac Enzymes:  Recent Labs Lab 12/29/14 1258 12/29/14 1717 12/29/14 2225 12/30/14 0646   TROPONINI 0.03 0.03 0.03 <0.03   BNP: BNP (last 3 results)  Recent Labs  12/29/14 1258  BNP 593.0*    ProBNP (last 3 results) No results for input(s): PROBNP in the last 8760 hours.  CBG: No results for input(s): GLUCAP in the last 168 hours.     SignedKathlen Mody  [facility] 01/02/2015, 4:08 PM

## 2015-01-02 NOTE — Consult Note (Signed)
Primary Cardiologist: Charlton Haws MD  Cardiology Specific Problem List:  1. Diastolic CHF 2. Hypertension  Subjective:    Feels "lazy" today. Breathing better today. Has not been up OOB.   Objective:   Temp:  [98.4 F (36.9 C)-98.6 F (37 C)] 98.4 F (36.9 C) (02/08 0525) Pulse Rate:  [58-72] 58 (02/08 0525) Resp:  [20-22] 20 (02/08 0525) BP: (91-132)/(51-87) 116/51 mmHg (02/08 0525) SpO2:  [100 %] 100 % (02/08 0525) Weight:  [179 lb 14.3 oz (81.6 kg)] 179 lb 14.3 oz (81.6 kg) (02/08 0525) Last BM Date: 01/01/15  Filed Weights   12/31/14 0530 01/01/15 0609 01/02/15 0525  Weight: 177 lb 0.5 oz (80.3 kg) 213 lb 13.5 oz (97 kg) 179 lb 14.3 oz (81.6 kg)    Intake/Output Summary (Last 24 hours) at 01/02/15 0844 Last data filed at 01/02/15 0531  Gross per 24 hour  Intake    723 ml  Output   1300 ml  Net   -577 ml   Echocardiogram: 12/30/2014 Procedure narrative: Transthoracic echocardiography. Image quality was suboptimal. The study was technically difficult, as a result of poor sound wave transmission. - Left ventricle: The cavity size was normal. There was mild concentric hypertrophy. Systolic function was normal. The estimated ejection fraction was in the range of 60% to 65%. Images were inadequate for LV wall motion assessment. Features are consistent with a pseudonormal left ventricular filling pattern, with concomitant abnormal relaxation and increased filling pressure (grade 2 diastolic dysfunction). Doppler parameters are consistent with high ventricular filling pressure. - Aortic valve: Mildly calcified annulus. Trileaflet; mildly thickened leaflets. - Mitral valve: Calcified annulus. Mildly thickened leaflets . There was mild regurgitation. There may be a mild degree of mitral stenosis. - Left atrium: The atrium was moderately to severely dilated. - Right ventricle: The cavity size was mildly dilated. Wall thickness was  normal. - Right atrium: The atrium was mildly to moderately dilated. - Tricuspid valve: There was mild-moderate regurgitation. - Pulmonary arteries: PA peak pressure: 77 mm Hg (S). Severely elevated pulmonary pressures. - Inferior vena cava: The vessel was dilated. The respirophasic diameter changes were blunted (< 50%), consistent with elevated central venous pressure.  Telemetry:  NSR rates 60's.   Exam:  General: No acute distress.  Lungs: Clear to auscultation, nonlabored.  Cardiac: No elevated JVP or bruits. RRR, no gallop or rub.   Abdomen: Normoactive bowel sounds, nontender, nondistended.  Extremities: No pitting edema, distal pulses full.   Lab Results:  Basic Metabolic Panel:  Recent Labs Lab 12/30/14 0646 12/31/14 0801 01/01/15 0544  NA 140 142 141  K 4.6 4.2 4.1  CL 105 103 102  CO2 28 33* 33*  GLUCOSE 171* 91 91  BUN 25* 26* 29*  CREATININE 1.27* 1.61* 1.54*  CALCIUM 8.5 8.7 8.3*    Liver Function Tests:  Recent Labs Lab 12/30/14 0646  AST 20  ALT 17  ALKPHOS 68  BILITOT 0.7  PROT 6.3  ALBUMIN 2.8*    CBC:  Recent Labs Lab 12/29/14 1258 12/30/14 0646  WBC 6.7 6.0  HGB 9.1* 9.6*  HCT 29.0* 30.4*  MCV 103.9* 102.4*  PLT 161 188    Cardiac Enzymes:  Recent Labs Lab 12/29/14 1717 12/29/14 2225 12/30/14 0646  TROPONINI 0.03 0.03 <0.03    Medications:   Scheduled Medications: . atorvastatin  20 mg Oral Daily  . ciprofloxacin  250 mg Oral BID  . citalopram  20 mg Oral Daily  . clopidogrel  75 mg  Oral Daily  . enoxaparin (LOVENOX) injection  30 mg Subcutaneous Q24H  . folic acid  1 mg Oral Daily  . furosemide  40 mg Oral Daily  . levothyroxine  100 mcg Oral QAC breakfast  . metoprolol tartrate  25 mg Oral BID  . sodium chloride  3 mL Intravenous Q12H  . traZODone  150 mg Oral QHS   PRN Medications: HYDROcodone-acetaminophen, ondansetron **OR** ondansetron (ZOFRAN) IV   Assessment and Plan:   1. Acute  Diastolic CHF: She appears well compensated with 3.6 liter diureses since admission. She denies issues with breathing. No edema is seen. She has will be transitioned to po lasix this am, 40 mg daily today, continue metoprolol 25 mg BID. Continue low sodium diet. Should be able to go home from cardiac standpoint within the next 24 hours.   2. Hypertension:  Low normal. She is not getting OOB or walking much in the room, preferring to stay in the bed. Recommend PT evaluation for deconditioning. OOB in the chair for meals.  3. COPD: Tx per PTH  Bettey MareKathryn M. Lawrence NP [facility]  01/02/2015, 8:44 AM    Attending note:  Patient seen and examined. Reviewed records including recent consultation note by Dr. Purvis SheffieldKoneswaran. Patient has had clinical improvement with diuresis for acute on chronic diastolic heart failure. Follow-up echocardiogram demonstrates LVEF 60-65% with grade 2 diastolic dysfunction and increased filling pressures. Regimen includes Lasix which was just changed to 40 mg oral today, Plavix, Toprol-XL, and Lipitor. Blood pressure has been reasonably well controlled, and she also has renal insufficiency with creatinine ranging 1.2-1.6 overall. Would hold off on addition of ACE inhibitor or ARB at this point. No further inpatient cardiac testing is anticipated.  Jonelle SidleSamuel G. McDowell, M.D., F.A.C.C.

## 2015-01-02 NOTE — Evaluation (Signed)
Physical Therapy Evaluation Patient Details Name: Sierra KalesShelby J Singleton MRN: [id number] DOB: 02-05-1933 Today's Date: 01/02/2015   History of Present Illness  This is a 81 year old lady who has history of COPD and describes dyspnea on exertion for several years but has never been oxygen dependent. Today, this morning, she became short of breath and she tells me that this began more so last evening but got worse today. She denies any cough or fever with it. [facility] with a rash and at that time had breathing difficulties and was admitted to the hospital. This was when she was given clindamycin for a rash that she has that she normally gets Keflex for. It seems that the severe reaction was from clindamycin. We do not have records of this area. The rash is called  Zebedee IbaHaley Haley. She currently denies any chest pain, palpitations, limb weakness. Evaluation in the emergency room is more suggestive of congestive heart failure.  Clinical Impression   Pt was seen for evaluation.  She was alert and oriented, cooperative.  Pt lives alone and has family nearby.  She is normally independent at home with no assistive device.  She states today "I am wobbly".  She is now being given supplemental O2.  Her strength is WNL with very mild dynamic balance deficit.  She was instructed in gait with a rolling walker and gait was very stable.  She will use this at home as needed.    Follow Up Recommendations No PT follow up    Equipment Recommendations  Rolling walker with 5" wheels    Recommendations for Other Services   none    Precautions / Restrictions Precautions Precautions: Fall Restrictions Weight Bearing Restrictions: No      Mobility  Bed Mobility Overal bed mobility: Independent                Transfers Overall transfer level: Independent                  Ambulation/Gait Ambulation/Gait assistance: Modified independent  (Device/Increase time) Ambulation Distance (Feet): 450 Feet Assistive device: Rolling walker (2 wheeled) Gait Pattern/deviations: WFL(Within Functional Limits)   Gait velocity interpretation: at or above normal speed for age/gender    Stairs            Wheelchair Mobility    Modified Rankin (Stroke Patients Only)       Balance Overall balance assessment: Needs assistance (very mild ) Sitting-balance support: No upper extremity supported;Feet supported Sitting balance-Leahy Scale: Normal     Standing balance support: During functional activity Standing balance-Leahy Scale: Good Standing balance comment: very mild dynamic balance deficit                             Pertinent Vitals/Pain Pain Assessment: No/denies pain    Home Living Family/patient expects to be discharged to:: Private residence Living Arrangements: Alone Available Help at Discharge: Family;Available PRN/intermittently Type of Home: Apartment Home Access: Stairs to enter Entrance Stairs-Rails: Right Entrance Stairs-Number of Steps: 2 Home Layout: One level Home Equipment: Shower seat      Prior Function Level of Independence: Independent               Hand Dominance        Extremity/Trunk Assessment   Upper Extremity Assessment: Overall WFL for tasks assessed           Lower Extremity Assessment: Overall  WFL for tasks assessed         Communication   Communication: No difficulties  Cognition Arousal/Alertness: Awake/alert Behavior During Therapy: WFL for tasks assessed/performed Overall Cognitive Status: Within Functional Limits for tasks assessed                      General Comments      Exercises        Assessment/Plan    PT Assessment Patent does not need any further PT services  PT Diagnosis     PT Problem List    PT Treatment Interventions     PT Goals (Current goals can be found in the Care Plan section) Acute Rehab PT Goals PT  Goal Formulation: All assessment and education complete, DC therapy    Frequency     Barriers to discharge  none      Co-evaluation               End of Session Equipment Utilized During Treatment: Gait belt;Oxygen Activity Tolerance: Patient tolerated treatment well Patient left: in bed;with call bell/phone within reach;with bed alarm set Nurse Communication: Mobility status         Time: 1610-9604 PT Time Calculation (min) (ACUTE ONLY): 43 min   Charges:   PT Evaluation $Initial PT Evaluation Tier I: 1 Procedure     PT G CodesKonrad Penta 01/02/2015, 4:29 PM

## 2015-01-02 NOTE — Progress Notes (Signed)
SATURATION QUALIFICATIONS: (This note is used to comply with regulatory documentation for home oxygen)  Patient Saturations on Room Air at Rest = 93%  Patient Saturations on Room Air while Ambulating = 87%  Patient Saturations on 2 Liters of oxygen while Ambulating = 97%

## 2015-01-02 NOTE — Progress Notes (Signed)
Patient discharged home with family.  IV removed - WNL.  DC instructions and medications reviewed with patient and family.  Educated on heart failure management at home and given packet and scale to take home for daily weights.  Instructed to take meds as prescribed and educated on when to call MD VS seek emergent medical care.  Verbalizes understanding.  No questions at this time.  Instructed to make follow up appt with PCP in 1-2 weeks.  Advanced following for O2 needs.  Patient stable to DC home, [facility].

## 2015-02-09 ENCOUNTER — Emergency Department (HOSPITAL_COMMUNITY): Payer: Medicare Other

## 2015-02-09 ENCOUNTER — Encounter (HOSPITAL_COMMUNITY): Payer: Self-pay

## 2015-02-09 ENCOUNTER — Inpatient Hospital Stay (HOSPITAL_COMMUNITY)
Admission: EM | Admit: 2015-02-09 | Discharge: 2015-02-20 | DRG: 208 | Disposition: A | Discharge status: Skilled Nursing Facility | Payer: Medicare Other | Attending: Internal Medicine | Admitting: Internal Medicine

## 2015-02-09 DIAGNOSIS — E039 Hypothyroidism, unspecified: Secondary | ICD-10-CM | POA: Diagnosis present

## 2015-02-09 DIAGNOSIS — N183 Chronic kidney disease, stage 3 (moderate): Secondary | ICD-10-CM | POA: Diagnosis present

## 2015-02-09 DIAGNOSIS — J9621 Acute and chronic respiratory failure with hypoxia: Principal | ICD-10-CM | POA: Diagnosis present

## 2015-02-09 DIAGNOSIS — I5033 Acute on chronic diastolic (congestive) heart failure: Secondary | ICD-10-CM | POA: Insufficient documentation

## 2015-02-09 DIAGNOSIS — J9600 Acute respiratory failure, unspecified whether with hypoxia or hypercapnia: Secondary | ICD-10-CM

## 2015-02-09 DIAGNOSIS — R0602 Shortness of breath: Secondary | ICD-10-CM | POA: Diagnosis present

## 2015-02-09 DIAGNOSIS — I5043 Acute on chronic combined systolic (congestive) and diastolic (congestive) heart failure: Secondary | ICD-10-CM | POA: Diagnosis present

## 2015-02-09 DIAGNOSIS — J449 Chronic obstructive pulmonary disease, unspecified: Secondary | ICD-10-CM | POA: Diagnosis present

## 2015-02-09 DIAGNOSIS — Z823 Family history of stroke: Secondary | ICD-10-CM | POA: Diagnosis not present

## 2015-02-09 DIAGNOSIS — Z7902 Long term (current) use of antithrombotics/antiplatelets: Secondary | ICD-10-CM

## 2015-02-09 DIAGNOSIS — D539 Nutritional anemia, unspecified: Secondary | ICD-10-CM | POA: Diagnosis not present

## 2015-02-09 DIAGNOSIS — I4891 Unspecified atrial fibrillation: Secondary | ICD-10-CM | POA: Diagnosis present

## 2015-02-09 DIAGNOSIS — Z978 Presence of other specified devices: Secondary | ICD-10-CM

## 2015-02-09 DIAGNOSIS — I248 Other forms of acute ischemic heart disease: Secondary | ICD-10-CM | POA: Diagnosis present

## 2015-02-09 DIAGNOSIS — N179 Acute kidney failure, unspecified: Secondary | ICD-10-CM | POA: Diagnosis present

## 2015-02-09 DIAGNOSIS — F329 Major depressive disorder, single episode, unspecified: Secondary | ICD-10-CM | POA: Diagnosis present

## 2015-02-09 DIAGNOSIS — I509 Heart failure, unspecified: Secondary | ICD-10-CM

## 2015-02-09 DIAGNOSIS — I129 Hypertensive chronic kidney disease with stage 1 through stage 4 chronic kidney disease, or unspecified chronic kidney disease: Secondary | ICD-10-CM | POA: Diagnosis present

## 2015-02-09 DIAGNOSIS — M25552 Pain in left hip: Secondary | ICD-10-CM | POA: Diagnosis present

## 2015-02-09 DIAGNOSIS — E038 Other specified hypothyroidism: Secondary | ICD-10-CM

## 2015-02-09 DIAGNOSIS — J9601 Acute respiratory failure with hypoxia: Secondary | ICD-10-CM | POA: Diagnosis present

## 2015-02-09 DIAGNOSIS — E785 Hyperlipidemia, unspecified: Secondary | ICD-10-CM | POA: Diagnosis present

## 2015-02-09 DIAGNOSIS — J9611 Chronic respiratory failure with hypoxia: Secondary | ICD-10-CM

## 2015-02-09 DIAGNOSIS — I482 Chronic atrial fibrillation: Secondary | ICD-10-CM | POA: Diagnosis not present

## 2015-02-09 DIAGNOSIS — Z809 Family history of malignant neoplasm, unspecified: Secondary | ICD-10-CM

## 2015-02-09 DIAGNOSIS — Z789 Other specified health status: Secondary | ICD-10-CM

## 2015-02-09 HISTORY — DX: Unspecified atrial fibrillation: I48.91

## 2015-02-09 HISTORY — DX: Chronic respiratory failure with hypoxia: J96.11

## 2015-02-09 LAB — BLOOD GAS, ARTERIAL
Acid-base deficit: 0.9 mmol/L (ref 0.0–2.0)
Bicarbonate: 24.8 mEq/L — ABNORMAL HIGH (ref 20.0–24.0)
Drawn by: 234301
FIO2: 100 %
O2 Saturation: 99.3 %
PEEP: 5 cmH2O
PH ART: 7.287 — AB (ref 7.350–7.450)
PO2 ART: 340 mmHg — AB (ref 80.0–100.0)
Patient temperature: 37
RATE: 15 resp/min
TCO2: 22.7 mmol/L (ref 0–100)
VT: 400 mL
pCO2 arterial: 53.6 mmHg — ABNORMAL HIGH (ref 35.0–45.0)

## 2015-02-09 LAB — URINALYSIS, ROUTINE W REFLEX MICROSCOPIC
Bilirubin Urine: NEGATIVE
Glucose, UA: NEGATIVE mg/dL
KETONES UR: NEGATIVE mg/dL
LEUKOCYTES UA: NEGATIVE
NITRITE: NEGATIVE
SPECIFIC GRAVITY, URINE: 1.025 (ref 1.005–1.030)
Urobilinogen, UA: 0.2 mg/dL (ref 0.0–1.0)
pH: 6 (ref 5.0–8.0)

## 2015-02-09 LAB — CBC WITH DIFFERENTIAL/PLATELET
BASOS ABS: 0.1 10*3/uL (ref 0.0–0.1)
Basophils Relative: 0 % (ref 0–1)
EOS ABS: 0.8 10*3/uL — AB (ref 0.0–0.7)
EOS PCT: 5 % (ref 0–5)
HEMATOCRIT: 33.8 % — AB (ref 36.0–46.0)
HEMOGLOBIN: 10.5 g/dL — AB (ref 12.0–15.0)
LYMPHS ABS: 5.9 10*3/uL — AB (ref 0.7–4.0)
LYMPHS PCT: 40 % (ref 12–46)
MCH: 32 pg (ref 26.0–34.0)
MCHC: 31.1 g/dL (ref 30.0–36.0)
MCV: 103 fL — AB (ref 78.0–100.0)
Monocytes Absolute: 0.4 10*3/uL (ref 0.1–1.0)
Monocytes Relative: 3 % (ref 3–12)
Neutro Abs: 7.7 10*3/uL (ref 1.7–7.7)
Neutrophils Relative %: 52 % (ref 43–77)
Platelets: 198 10*3/uL (ref 150–400)
RBC: 3.28 MIL/uL — AB (ref 3.87–5.11)
RDW: 14.2 % (ref 11.5–15.5)
WBC: 14.8 10*3/uL — AB (ref 4.0–10.5)

## 2015-02-09 LAB — TROPONIN I
TROPONIN I: 0.59 ng/mL — AB (ref <0.031)
Troponin I: <0.03 ng/mL (ref <0.031)

## 2015-02-09 LAB — TSH: TSH: 1.96 u[IU]/mL (ref 0.350–4.500)

## 2015-02-09 LAB — COMPREHENSIVE METABOLIC PANEL
ALBUMIN: 3.4 g/dL — AB (ref 3.5–5.2)
ALK PHOS: 71 U/L (ref 39–117)
ALT: 14 U/L (ref 0–35)
ANION GAP: 5 (ref 5–15)
AST: 27 U/L (ref 0–37)
BUN: 29 mg/dL — ABNORMAL HIGH (ref 6–23)
CALCIUM: 8.8 mg/dL (ref 8.4–10.5)
CO2: 29 mmol/L (ref 19–32)
CREATININE: 1.4 mg/dL — AB (ref 0.50–1.10)
Chloride: 106 mmol/L (ref 96–112)
GFR calc non Af Amer: 35 mL/min — ABNORMAL LOW (ref >90)
GFR, EST AFRICAN AMERICAN: 40 mL/min — AB (ref >90)
GLUCOSE: 211 mg/dL — AB (ref 70–99)
Potassium: 3.4 mmol/L — ABNORMAL LOW (ref 3.5–5.1)
Sodium: 140 mmol/L (ref 135–145)
TOTAL PROTEIN: 7.1 g/dL (ref 6.0–8.3)
Total Bilirubin: 0.7 mg/dL (ref 0.3–1.2)

## 2015-02-09 LAB — URINE MICROSCOPIC-ADD ON

## 2015-02-09 LAB — TRIGLYCERIDES: Triglycerides: 121 mg/dL (ref <150)

## 2015-02-09 LAB — BRAIN NATRIURETIC PEPTIDE: B NATRIURETIC PEPTIDE 5: 676 pg/mL — AB (ref 0.0–100.0)

## 2015-02-09 LAB — MRSA PCR SCREENING: MRSA by PCR: POSITIVE — AB

## 2015-02-09 LAB — I-STAT CG4 LACTIC ACID, ED: Lactic Acid, Venous: 1.21 mmol/L (ref 0.5–2.0)

## 2015-02-09 MED ORDER — DOCUSATE SODIUM 50 MG/5ML PO LIQD
100.0000 mg | Freq: Two times a day (BID) | ORAL | Status: DC | PRN
  Filled 2015-02-09 (×2): qty 10

## 2015-02-09 MED ORDER — PROPOFOL 10 MG/ML IV EMUL
5.0000 ug/kg/min | Freq: Once | INTRAVENOUS | Status: AC
  Administered 2015-02-09: 5 ug/kg/min via INTRAVENOUS
  Filled 2015-02-09: qty 100

## 2015-02-09 MED ORDER — FENTANYL CITRATE 0.05 MG/ML IJ SOLN: 50.0000 ug | INTRAMUSCULAR | Status: DC | PRN

## 2015-02-09 MED ORDER — FUROSEMIDE 10 MG/ML IJ SOLN
40.0000 mg | Freq: Two times a day (BID) | INTRAMUSCULAR | Status: DC
  Administered 2015-02-09 – 2015-02-10 (×2): 40 mg via INTRAVENOUS
  Filled 2015-02-09 (×2): qty 4

## 2015-02-09 MED ORDER — MUPIROCIN 2 % EX OINT
TOPICAL_OINTMENT | Freq: Two times a day (BID) | CUTANEOUS | Status: DC
  Administered 2015-02-09 – 2015-02-12 (×6): via NASAL
  Administered 2015-02-12: 1 via NASAL
  Administered 2015-02-13 – 2015-02-17 (×10): via NASAL
  Administered 2015-02-18: 1 via NASAL
  Administered 2015-02-18 – 2015-02-20 (×4): via NASAL
  Filled 2015-02-09: qty 22

## 2015-02-09 MED ORDER — IPRATROPIUM-ALBUTEROL 0.5-2.5 (3) MG/3ML IN SOLN
3.0000 mL | RESPIRATORY_TRACT | Status: DC
  Administered 2015-02-09 – 2015-02-10 (×8): 3 mL via RESPIRATORY_TRACT
  Filled 2015-02-09 (×8): qty 3

## 2015-02-09 MED ORDER — LEVOTHYROXINE SODIUM 100 MCG IV SOLR
50.0000 ug | Freq: Every day | INTRAVENOUS | Status: DC
  Administered 2015-02-10: 50 ug via INTRAVENOUS
  Filled 2015-02-09 (×4): qty 5

## 2015-02-09 MED ORDER — ETOMIDATE 2 MG/ML IV SOLN
INTRAVENOUS | Status: AC | PRN
  Administered 2015-02-09: 30 mg via INTRAVENOUS

## 2015-02-09 MED ORDER — MIDAZOLAM HCL 10 MG/2ML IJ SOLN
4.0000 mg | Freq: Once | INTRAMUSCULAR | Status: AC
  Administered 2015-02-09: 4 mg via INTRAVENOUS

## 2015-02-09 MED ORDER — SODIUM CHLORIDE 0.9 % IV SOLN: 250.0000 mL | INTRAVENOUS | Status: DC | PRN

## 2015-02-09 MED ORDER — FUROSEMIDE 10 MG/ML IJ SOLN
40.0000 mg | Freq: Once | INTRAMUSCULAR | Status: AC
  Administered 2015-02-09: 40 mg via INTRAVENOUS
  Filled 2015-02-09: qty 4

## 2015-02-09 MED ORDER — PROPOFOL 10 MG/ML IV EMUL: 0.0000 ug/kg/min | INTRAVENOUS | Status: DC

## 2015-02-09 MED ORDER — ONDANSETRON HCL 4 MG/2ML IJ SOLN: 4.0000 mg | Freq: Four times a day (QID) | INTRAMUSCULAR | Status: DC | PRN

## 2015-02-09 MED ORDER — CETYLPYRIDINIUM CHLORIDE 0.05 % MT LIQD
7.0000 mL | Freq: Two times a day (BID) | OROMUCOSAL | Status: DC
  Administered 2015-02-09 – 2015-02-20 (×22): 7 mL via OROMUCOSAL

## 2015-02-09 MED ORDER — CHLORHEXIDINE GLUCONATE 0.12 % MT SOLN
15.0000 mL | Freq: Two times a day (BID) | OROMUCOSAL | Status: DC
  Administered 2015-02-09 (×2): 15 mL via OROMUCOSAL
  Filled 2015-02-09 (×2): qty 15

## 2015-02-09 MED ORDER — ROCURONIUM BROMIDE 50 MG/5ML IV SOLN
INTRAVENOUS | Status: AC
  Filled 2015-02-09: qty 2

## 2015-02-09 MED ORDER — PROPOFOL 10 MG/ML IV EMUL: 5.0000 ug/kg/min | Freq: Once | INTRAVENOUS | Status: DC

## 2015-02-09 MED ORDER — FUROSEMIDE 10 MG/ML IJ SOLN: 40.0000 mg | Freq: Two times a day (BID) | INTRAMUSCULAR | Status: DC

## 2015-02-09 MED ORDER — PROPOFOL 10 MG/ML IV BOLUS: 30.0000 mg | Freq: Once | INTRAVENOUS | Status: AC

## 2015-02-09 MED ORDER — SODIUM CHLORIDE 0.9 % IJ SOLN: 3.0000 mL | INTRAMUSCULAR | Status: DC | PRN

## 2015-02-09 MED ORDER — LIDOCAINE HCL (CARDIAC) 20 MG/ML IV SOLN
INTRAVENOUS | Status: AC
Start: 2015-02-09 — End: 2015-02-09
  Filled 2015-02-09: qty 5

## 2015-02-09 MED ORDER — ENOXAPARIN SODIUM 40 MG/0.4ML ~~LOC~~ SOLN
40.0000 mg | SUBCUTANEOUS | Status: DC
  Administered 2015-02-09 – 2015-02-10 (×2): 40 mg via SUBCUTANEOUS
  Filled 2015-02-09 (×3): qty 0.4

## 2015-02-09 MED ORDER — MIDAZOLAM HCL 5 MG/5ML IJ SOLN
INTRAMUSCULAR | Status: AC
  Filled 2015-02-09: qty 5

## 2015-02-09 MED ORDER — SODIUM CHLORIDE 0.9 % IJ SOLN
3.0000 mL | Freq: Two times a day (BID) | INTRAMUSCULAR | Status: DC
  Administered 2015-02-10 – 2015-02-20 (×15): 3 mL via INTRAVENOUS

## 2015-02-09 MED ORDER — SUCCINYLCHOLINE CHLORIDE 20 MG/ML IJ SOLN
INTRAMUSCULAR | Status: AC
  Filled 2015-02-09: qty 1

## 2015-02-09 MED ORDER — ROCURONIUM BROMIDE 50 MG/5ML IV SOLN
INTRAVENOUS | Status: AC | PRN
  Administered 2015-02-09: 80 mg via INTRAVENOUS

## 2015-02-09 MED ORDER — ONDANSETRON HCL 4 MG PO TABS: 4.0000 mg | ORAL_TABLET | Freq: Four times a day (QID) | ORAL | Status: DC | PRN

## 2015-02-09 MED ORDER — CHLORHEXIDINE GLUCONATE CLOTH 2 % EX PADS
6.0000 | MEDICATED_PAD | Freq: Every day | CUTANEOUS | Status: DC
  Administered 2015-02-10 – 2015-02-20 (×10): 6 via TOPICAL

## 2015-02-09 MED ORDER — PROPOFOL 10 MG/ML IV EMUL
0.0000 ug/kg/min | INTRAVENOUS | Status: DC
  Administered 2015-02-09: 35 ug/kg/min via INTRAVENOUS
  Administered 2015-02-09 – 2015-02-10 (×2): 40 ug/kg/min via INTRAVENOUS
  Filled 2015-02-09 (×3): qty 100

## 2015-02-09 MED ORDER — ETOMIDATE 2 MG/ML IV SOLN
INTRAVENOUS | Status: AC
  Filled 2015-02-09: qty 20

## 2015-02-09 NOTE — ED Notes (Signed)
Pt stable after ET tube placement.  RT at bedside.  nad noted

## 2015-02-09 NOTE — ED Notes (Signed)
Per ems, pt called out d/t sob.  Ems reports upon arrival pt in tripod position and having difficulty breathing.  Pt reports her sob has gotten worse throughout the morning.  Ems reports giving pt 2 albuterol breathing treatments and 125mg  solu-medrol.  Upon arrival to ER, pt in resp distress.  Pt 96% on 8L O2.

## 2015-02-09 NOTE — Consult Note (Signed)
Consult requested by: Dr. Ardyth HarpsHernandez Consult requested for respiratory failure:  HPI: This is a 81 year old who has a history of COPD and heart failure. She apparently became more short of breath called EMS and was brought to the hospital. She was almost immediately intubated. We don't have much other history available. Her nephew and granddaughter are in the room but neither of them were present during the event so they don't know exactly what happened. We don't know about whether she was taking her medications because she had chest pain etc. She does have a history of COPD and has been on oxygen at home.  Past Medical History  Diagnosis Date  . Rash     Zebedee IbaHaley Haley rash  . Anxiety   . Thyroid disease   . Hyperlipidemia   . Hypertension   . COPD (chronic obstructive pulmonary disease)   . Poor historian   . Carotid artery disease      Family History  Problem Relation Age of Onset  . Cancer Father   . Stroke Mother      History   Social History  . Marital Status: Divorced    Spouse Name: N/A  . Number of Children: N/A  . Years of Education: N/A   Social History Main Topics  . Smoking status: Never Smoker   . Smokeless tobacco: Not on file  . Alcohol Use: No  . Drug Use: No  . Sexual Activity: No   Other Topics Concern  . None   Social History Narrative     ROS: Unobtainable    Objective: Vital signs in last 24 hours: Temp:  [98.7 F (37.1 C)] 98.7 F (37.1 C) (03/17 1211) Pulse Rate:  [62-123] 70 (03/17 1630) Resp:  [13-27] 15 (03/17 1630) BP: (110-172)/(35-123) 150/35 mmHg (03/17 1630) SpO2:  [92 %-100 %] 99 % (03/17 1630) FiO2 (%):  [40 %-100 %] 40 % (03/17 1609) Weight:  [77.5 kg (170 lb 13.7 oz)] 77.5 kg (170 lb 13.7 oz) (03/17 1615) Weight change:     Intake/Output from previous day:    PHYSICAL EXAM She is intubated and sedated and on the ventilator. Her pupils are reactive nodes and throat are clear her neck is supple. Her chest is really  pretty clear now. Heart is regular without gallop. Abdomen is soft no masses felt she had been moving all 4 extremities but is sedated now  Lab Results: Basic Metabolic Panel:  Recent Labs  16/08/9602/17/16 1030  NA 140  K 3.4*  CL 106  CO2 29  GLUCOSE 211*  BUN 29*  CREATININE 1.40*  CALCIUM 8.8   Liver Function Tests:  Recent Labs  02/09/15 1030  AST 27  ALT 14  ALKPHOS 71  BILITOT 0.7  PROT 7.1  ALBUMIN 3.4*   No results for input(s): LIPASE, AMYLASE in the last 72 hours. No results for input(s): AMMONIA in the last 72 hours. CBC:  Recent Labs  02/09/15 1030  WBC 14.8*  NEUTROABS 7.7  HGB 10.5*  HCT 33.8*  MCV 103.0*  PLT 198   Cardiac Enzymes:  Recent Labs  02/09/15 1030  TROPONINI <0.03   BNP: No results for input(s): PROBNP in the last 72 hours. D-Dimer: No results for input(s): DDIMER in the last 72 hours. CBG: No results for input(s): GLUCAP in the last 72 hours. Hemoglobin A1C: No results for input(s): HGBA1C in the last 72 hours. Fasting Lipid Panel: No results for input(s): CHOL, HDL, LDLCALC, TRIG, CHOLHDL, LDLDIRECT in the last 72  hours. Thyroid Function Tests:  Recent Labs  02/09/15 1030  TSH 1.960   Anemia Panel: No results for input(s): VITAMINB12, FOLATE, FERRITIN, TIBC, IRON, RETICCTPCT in the last 72 hours. Coagulation: No results for input(s): LABPROT, INR in the last 72 hours. Urine Drug Screen: Drugs of Abuse  No results found for: LABOPIA, COCAINSCRNUR, LABBENZ, AMPHETMU, THCU, LABBARB  Alcohol Level: No results for input(s): ETH in the last 72 hours. Urinalysis:  Recent Labs  02/09/15 1125  COLORURINE YELLOW  LABSPEC 1.025  PHURINE 6.0  GLUCOSEU NEGATIVE  HGBUR MODERATE*  BILIRUBINUR NEGATIVE  KETONESUR NEGATIVE  PROTEINUR >300*  UROBILINOGEN 0.2  NITRITE NEGATIVE  LEUKOCYTESUR NEGATIVE   Misc. Labs:   ABGS:  Recent Labs  02/09/15 1115  PHART 7.287*  PO2ART 340.0*  TCO2 22.7  HCO3 24.8*      MICROBIOLOGY: Recent Results (from the past 240 hour(s))  MRSA PCR Screening     Status: Abnormal   Collection Time: 02/09/15  2:40 PM  Result Value Ref Range Status   MRSA by PCR POSITIVE (A) NEGATIVE Final    Comment:        The GeneXpert MRSA Assay (FDA approved for NASAL specimens only), is one component of a comprehensive MRSA colonization surveillance program. It is not intended to diagnose MRSA infection nor to guide or monitor treatment for MRSA infections. RESULT CALLED TO, READ BACK BY AND VERIFIED WITH: COFFEE,J ON 02/09/15  BY MITCHELL,S     Studies/Results: Dg Chest Portable 1 View  02/09/2015   CLINICAL DATA:  Evaluation for endotracheal tube, shortness of breath, history of COPD  EXAM: PORTABLE CHEST - 1 VIEW  COMPARISON:  12/29/2014  FINDINGS: Endotracheal tube tip about 4.5 cm above the carina. Mild cardiac enlargement. Similar to prior study there is vascular congestion and moderate to severe interstitial prominence, the severity of which is minimally worse when compared to the prior study. Hyperinflation consistent with COPD.  IMPRESSION: Congestive heart failure with moderate to severe interstitial edema, similar but slightly worse when compared to prior study.   Electronically Signed   By: Esperanza Heir M.D.   On: 02/09/2015 10:49    Medications:  Prior to Admission:  Prescriptions prior to admission  Medication Sig Dispense Refill Last Dose  . atorvastatin (LIPITOR) 20 MG tablet Take 20 mg by mouth daily.   12/28/2014 at Unknown time  . citalopram (CELEXA) 40 MG tablet Take 40 mg by mouth daily.   12/28/2014 at Unknown time  . clopidogrel (PLAVIX) 75 MG tablet Take 75 mg by mouth daily.   12/28/2014 at Unknown time  . folic acid (FOLVITE) 1 MG tablet Take 1 mg by mouth daily.   12/28/2014 at Unknown time  . furosemide (LASIX) 40 MG tablet Take 1 tablet (40 mg total) by mouth daily. 30 tablet 1   . HYDROcodone-acetaminophen (NORCO) 7.5-325 MG per tablet  Take 1 tablet by mouth 3 (three) times daily as needed for moderate pain. 30 tablet 0   . levothyroxine (SYNTHROID, LEVOTHROID) 100 MCG tablet Take 100 mcg by mouth daily before breakfast.   12/28/2014 at Unknown time  . methotrexate 2.5 MG tablet Take 10 mg by mouth once a week. On Sunday.   12/25/2013  . metoprolol tartrate (LOPRESSOR) 25 MG tablet Take 1 tablet (25 mg total) by mouth 2 (two) times daily. 60 tablet 1   . traZODone (DESYREL) 150 MG tablet Take by mouth at bedtime.   12/28/2014 at Unknown time   Scheduled: . antiseptic oral  rinse  7 mL Mouth Rinse BID  . chlorhexidine  15 mL Mouth/Throat BID  . [START ON 02/10/2015] Chlorhexidine Gluconate Cloth  6 each Topical Q0600  . enoxaparin (LOVENOX) injection  40 mg Subcutaneous Q24H  . furosemide  40 mg Intravenous BID  . [START ON 02/10/2015] levothyroxine  50 mcg Intravenous Daily  . mupirocin ointment   Nasal BID  . sodium chloride  3 mL Intravenous Q12H   Continuous: . propofol 30 mcg/kg/min (02/09/15 1353)   ZOX:WRUEAV chloride, docusate, fentaNYL, fentaNYL, ondansetron **OR** ondansetron (ZOFRAN) IV, sodium chloride  Assesment: She was admitted with acute hypoxic and hypercapnic respiratory failure. She has been placed on mechanical ventilation. She has acute on chronic heart failure and has a history of COPD. It's not clear at this point how much of her respiratory failure is based on her congestive heart failure and how much on COPD. Principal Problem:   Acute respiratory failure with hypoxemia Active Problems:   Endotracheally intubated   Diastolic CHF, acute on chronic   Macrocytic anemia   ARF (acute renal failure)   Hypothyroidism    Plan: Continue ventilator support. I added bronchodilators. See if she can come off the ventilator tomorrow. If this is from heart failure she may be able to be extubated tomorrow but will see how she does    LOS: 0 days   Montreal Steidle L 02/09/2015, 5:44 PM

## 2015-02-09 NOTE — ED Notes (Signed)
Pt beginning to cough and opening her eyes.  Titrated propofol

## 2015-02-09 NOTE — H&P (Addendum)
[facility]          History and Physical    PCP:   VYAS,DHRUV B., MD   Chief Complaint:  Shortness of breath  HPI: Patient is a 81 year old woman with history significant for hypothyroidism, chronic diastolic heart failure with recent echo in February 2016 demonstrating an ejection fraction of 60-65% and grade 2 diastolic dysfunction and COPD who presented to the hospital with shortness of breath. However by the time I am seeing her she is intubated and hence am unable to obtain any history. She was so hypoxemic that the emergency department physician did not have time to obtain appropriate history either and proceeded straight to intubation. Workup in the emergency department shows a chest x-ray significant for congestive heart failure with moderate to severe interstitial edema, acute renal failure with a creatinine of 1.4 that is slightly improved from discharge at 1.54 on February 7, BNP of 676, was 593 on February 4, first set of troponins is negative. Son, Alvester Chou is present at bedside and has been updated. Unfortunately he does not live with his mother and is unable to give me any history either. We have been asked to admit her for further evaluation and management.  Allergies:   Allergies  Allergen Reactions  . Ambien [Zolpidem Tartrate]   . Sulfa Antibiotics Nausea And Vomiting  . Clindamycin/Lincomycin Swelling and Rash      Past Medical History  Diagnosis Date  . Rash     Harolyn Rutherford rash  . Anxiety   . Thyroid disease   . Hyperlipidemia   . Hypertension   . COPD (chronic obstructive pulmonary disease)   . Poor historian   . Carotid artery disease     Past Surgical History  Procedure Laterality Date  . Back surgery      Prior to Admission medications   Medication Sig Start Date End Date Taking? Authorizing Provider  atorvastatin (LIPITOR) 20 MG tablet Take 20 mg by mouth daily.    Historical Provider, MD  citalopram (CELEXA) 40 MG tablet  Take 40 mg by mouth daily.    Historical Provider, MD  clopidogrel (PLAVIX) 75 MG tablet Take 75 mg by mouth daily.    Historical Provider, MD  folic acid (FOLVITE) 1 MG tablet Take 1 mg by mouth daily.    Historical Provider, MD  furosemide (LASIX) 40 MG tablet Take 1 tablet (40 mg total) by mouth daily. 01/02/15   Hosie Poisson, MD  HYDROcodone-acetaminophen (NORCO) 7.5-325 MG per tablet Take 1 tablet by mouth 3 (three) times daily as needed for moderate pain. 01/02/15   Hosie Poisson, MD  levothyroxine (SYNTHROID, LEVOTHROID) 100 MCG tablet Take 100 mcg by mouth daily before breakfast.    Historical Provider, MD  methotrexate 2.5 MG tablet Take 10 mg by mouth once a week. On Sunday.    Historical Provider, MD  metoprolol tartrate (LOPRESSOR) 25 MG tablet Take 1 tablet (25 mg total) by mouth 2 (two) times daily. 01/02/15   Hosie Poisson, MD  traZODone (DESYREL) 150 MG tablet Take by mouth at bedtime.    Historical Provider, MD    Social History:  reports that she has never smoked. She does not have any smokeless tobacco history on file. She reports that she does not drink alcohol or use illicit drugs.  Family History  Problem Relation Age of Onset  . Cancer Father   . Stroke Mother  Review of Systems:  Unable to obtain as patient is currently intubated and  Physical Exam: Blood pressure 138/78, pulse 94, temperature 98.7 F (37.1 C), temperature source Rectal, resp. rate 27, height _0  (1.575 m), SpO2 93 %. General: Intubated, sedated HEENT: Normocephalic, atraumatic Neck: Supple, no lymphadenopathy, no bruits, no goiter Cardiovascular: Tachycardic, regular, no murmurs, rubs or gallops auscultated. Lungs: Coarse bilateral breath sounds, bibasilar crackles. Abdomen: Soft, nontender, nondistended, positive bowel sounds, no masses or organomegaly noted. Sign extremities: 2+ pitting edema bilaterally. Neurologic: Unable to assess as currently sedated on medications.  Labs on Admission:    Results for orders placed or performed during the hospital encounter of 02/09/15 (from the past 48 hour(s))  Troponin I     Status: None   Collection Time: 02/09/15 10:30 AM  Result Value Ref Range   Troponin I <0.03 <0.031 ng/mL    Comment:        NO INDICATION OF MYOCARDIAL INJURY.   Brain natriuretic peptide     Status: Abnormal   Collection Time: 02/09/15 10:30 AM  Result Value Ref Range   B Natriuretic Peptide 676.0 (H) 0.0 - 100.0 pg/mL  Comprehensive metabolic panel     Status: Abnormal   Collection Time: 02/09/15 10:30 AM  Result Value Ref Range   Sodium 140 135 - 145 mmol/L   Potassium 3.4 (L) 3.5 - 5.1 mmol/L   Chloride 106 96 - 112 mmol/L   CO2 29 19 - 32 mmol/L   Glucose, Bld 211 (H) 70 - 99 mg/dL   BUN 29 (H) 6 - 23 mg/dL   Creatinine, Ser 1.40 (H) 0.50 - 1.10 mg/dL   Calcium 8.8 8.4 - 10.5 mg/dL   Total Protein 7.1 6.0 - 8.3 g/dL   Albumin 3.4 (L) 3.5 - 5.2 g/dL   AST 27 0 - 37 U/L   ALT 14 0 - 35 U/L   Alkaline Phosphatase 71 39 - 117 U/L   Total Bilirubin 0.7 0.3 - 1.2 mg/dL   GFR calc non Af Amer 35 (L) >90 mL/min   GFR calc Af Amer 40 (L) >90 mL/min    Comment: (NOTE) The eGFR has been calculated using the CKD EPI equation. This calculation has not been validated in all clinical situations. eGFR's persistently <90 mL/min signify possible Chronic Kidney Disease.    Anion gap 5 5 - 15  CBC with Differential     Status: Abnormal   Collection Time: 02/09/15 10:30 AM  Result Value Ref Range   WBC 14.8 (H) 4.0 - 10.5 K/uL   RBC 3.28 (L) 3.87 - 5.11 MIL/uL   Hemoglobin 10.5 (L) 12.0 - 15.0 g/dL   HCT 33.8 (L) 36.0 - 46.0 %   MCV 103.0 (H) 78.0 - 100.0 fL   MCH 32.0 26.0 - 34.0 pg   MCHC 31.1 30.0 - 36.0 g/dL   RDW 14.2 11.5 - 15.5 %   Platelets 198 150 - 400 K/uL   Neutrophils Relative % 52 43 - 77 %   Neutro Abs 7.7 1.7 - 7.7 K/uL   Lymphocytes Relative 40 12 - 46 %   Lymphs Abs 5.9 (H) 0.7 - 4.0 K/uL   Monocytes Relative 3 3 - 12 %   Monocytes  Absolute 0.4 0.1 - 1.0 K/uL   Eosinophils Relative 5 0 - 5 %   Eosinophils Absolute 0.8 (H) 0.0 - 0.7 K/uL   Basophils Relative 0 0 - 1 %   Basophils Absolute 0.1 0.0 - 0.1 K/uL  I-Stat CG4 Lactic Acid, ED     Status: None   Collection Time: 02/09/15 10:36 AM  Result Value Ref Range   Lactic Acid, Venous 1.21 0.5 - 2.0 mmol/L  Blood gas, arterial     Status: Abnormal   Collection Time: 02/09/15 11:15 AM  Result Value Ref Range   FIO2 100.00 %   Delivery systems VENTILATOR    Mode PRESSURE REGULATED VOLUME CONTROL    VT 400 mL   Rate 15 resp/min   Peep/cpap 5.0 cm H20   pH, Arterial 7.287 (L) 7.350 - 7.450   pCO2 arterial 53.6 (H) 35.0 - 45.0 mmHg   pO2, Arterial 340.0 (H) 80.0 - 100.0 mmHg   Bicarbonate 24.8 (H) 20.0 - 24.0 mEq/L   TCO2 22.7 0 - 100 mmol/L   Acid-base deficit 0.9 0.0 - 2.0 mmol/L   O2 Saturation 99.3 %   Patient temperature 37.0    Collection site LEFT RADIAL    Drawn by [id number]    Sample type ARTERIAL    Allens test (pass/fail) PASS PASS    Radiological Exams on Admission: Dg Chest Portable 1 View  02/09/2015   CLINICAL DATA:  Evaluation for endotracheal tube, shortness of breath, history of COPD  EXAM: PORTABLE CHEST - 1 VIEW  COMPARISON:  12/29/2014  FINDINGS: Endotracheal tube tip about 4.5 cm above the carina. Mild cardiac enlargement. Similar to prior study there is vascular congestion and moderate to severe interstitial prominence, the severity of which is minimally worse when compared to the prior study. Hyperinflation consistent with COPD.  IMPRESSION: Congestive heart failure with moderate to severe interstitial edema, similar but slightly worse when compared to prior study.   Electronically Signed   By: Skipper Cliche M.D.   On: 02/09/2015 10:49    Assessment/Plan Principal Problem:   Acute respiratory failure with hypoxemia Active Problems:   Endotracheally intubated   Diastolic CHF, acute on chronic   Macrocytic anemia   ARF (acute renal  failure)   Hypothyroidism   Acute respiratory failure with hypoxemia -Secondary to presumed acute on chronic systolic congestive heart failure. -Is currently ventilatory dependent. -We'll request pulmonary consult to assist with ventilator management.  Acute on chronic diastolic CHF  -Prior echo results from February 2016 noted above.  -Placed on Lasix 40 mg IV twice a day.  -Strict intake and output/daily weights. -Troponins 3.  Acute renal failure -Is improved since prior discharge, chronic kidney disease is a possibility.  -Expect some acute worsening related to ongoing diuresis.  Hypothyroidism -Placed on half home dose of Synthroid via IV route.  Macrocytic anemia -Check RBC folate, B-12 and replete as needed.  DVT prophylaxis -Lovenox  CODE STATUS -Full code  Time Spent on Admission: 125 minutes  [facility] Pager: [phone] 02/09/2015, 12:19 PM

## 2015-02-09 NOTE — ED Provider Notes (Signed)
CSN: [id number]     Arrival date & time 02/09/15  1000 History  This chart was scribed for Benjiman CoreNathan Carman Essick, MD by Luisa DagoPriscilla Tutu, Medical Scribe. This patient was seen in room APA02/APA02 and the patient's care was started at 10:04 AM.    Chief Complaint  Patient presents with  . Shortness of Breath   Level 5 Caveat- pt condition (respiratory distress)  The history is provided by the EMS personnel and medical records. No language interpreter was used.   HPI Comments: Sierra Singleton is a 81 y.o. female with a PMhx of COPD was brought to the Emergency Department by EMS complaining of SOB. EMS personnel state that the pt is doing the same as when she was picked up. Pt is on oxygen at home. Her O2 stats were 97% but she still cannot breathe effectively on her own.   Past Medical History  Diagnosis Date  . Rash     Zebedee IbaHaley Haley rash  . Anxiety   . Thyroid disease   . Hyperlipidemia   . Hypertension   . COPD (chronic obstructive pulmonary disease)   . Poor historian   . Carotid artery disease    Past Surgical History  Procedure Laterality Date  . Back surgery     Family History  Problem Relation Age of Onset  . Cancer Father   . Stroke Mother    History  Substance Use Topics  . Smoking status: Never Smoker   . Smokeless tobacco: Not on file  . Alcohol Use: No   OB History    No data available     Review of Systems  Unable to perform ROS: Severe respiratory distress      Allergies  Ambien; Sulfa antibiotics; and Clindamycin/lincomycin  Home Medications   Prior to Admission medications   Medication Sig Start Date End Date Taking? Authorizing Provider  atorvastatin (LIPITOR) 20 MG tablet Take 20 mg by mouth daily.    Historical Provider, MD  citalopram (CELEXA) 40 MG tablet Take 40 mg by mouth daily.    Historical Provider, MD  clopidogrel (PLAVIX) 75 MG tablet Take 75 mg by mouth daily.    Historical Provider, MD  folic acid (FOLVITE) 1 MG tablet Take 1 mg by  mouth daily.    Historical Provider, MD  furosemide (LASIX) 40 MG tablet Take 1 tablet (40 mg total) by mouth daily. 01/02/15   Kathlen ModyVijaya Akula, MD  HYDROcodone-acetaminophen (NORCO) 7.5-325 MG per tablet Take 1 tablet by mouth 3 (three) times daily as needed for moderate pain. 01/02/15   Kathlen ModyVijaya Akula, MD  levothyroxine (SYNTHROID, LEVOTHROID) 100 MCG tablet Take 100 mcg by mouth daily before breakfast.    Historical Provider, MD  methotrexate 2.5 MG tablet Take 10 mg by mouth once a week. On Sunday.    Historical Provider, MD  metoprolol tartrate (LOPRESSOR) 25 MG tablet Take 1 tablet (25 mg total) by mouth 2 (two) times daily. 01/02/15   Kathlen ModyVijaya Akula, MD  traZODone (DESYREL) 150 MG tablet Take by mouth at bedtime.    Historical Provider, MD   BP 143/83 mmHg  Pulse 99  Resp 13  Ht 5\' 2"  (1.575 m)  SpO2 99%   Physical Exam  Constitutional: She appears well-developed. No distress.  Eyes: Conjunctivae are normal. Right eye exhibits no discharge. Left eye exhibits no discharge.  Cardiovascular: Exam reveals no gallop and no friction rub.   No murmur heard. Tachycardia  Pulmonary/Chest: She is in respiratory distress.  Diffuse rales with respiratory  distress. Tripoding. Unable to complete full sentences or give history. Having trouble with just a couple words.  Abdominal: Soft. She exhibits no distension. There is no tenderness.  Musculoskeletal: She exhibits edema. She exhibits no tenderness.  Neurological: She is alert.  Patient with some difficulties following commands. Confusion.  Skin: Skin is warm and dry.  Nursing note and vitals reviewed.   ED Course  Procedures (including critical care time)  10:04 AM- Pt arrived at ED. Severe respiratory distress. Pt will be intubated to help breathing.   CRITICAL CARE Performed by:  Benjiman Core, MD  Total critical care time: 30 min Critical care time was exclusive of separately billable procedures and treating other patients. Critical care  was necessary to treat or prevent imminent or life-threatening deterioration. Critical care was time spent personally by me on the following activities: development of treatment plan with patient and/or surrogate as well as nursing, discussions with consultants, evaluation of patient's response to treatment, examination of patient, obtaining history from patient or surrogate, ordering and performing treatments and interventions, ordering and review of laboratory studies, ordering and review of radiographic studies, pulse oximetry and re-evaluation of patient's condition.   Labs Review Labs Reviewed  BRAIN NATRIURETIC PEPTIDE - Abnormal; Notable for the following:    B Natriuretic Peptide 676.0 (*)    All other components within normal limits  COMPREHENSIVE METABOLIC PANEL - Abnormal; Notable for the following:    Potassium 3.4 (*)    Glucose, Bld 211 (*)    BUN 29 (*)    Creatinine, Ser 1.40 (*)    Albumin 3.4 (*)    GFR calc non Af Amer 35 (*)    GFR calc Af Amer 40 (*)    All other components within normal limits  CBC WITH DIFFERENTIAL/PLATELET - Abnormal; Notable for the following:    WBC 14.8 (*)    RBC 3.28 (*)    Hemoglobin 10.5 (*)    HCT 33.8 (*)    MCV 103.0 (*)    Lymphs Abs 5.9 (*)    Eosinophils Absolute 0.8 (*)    All other components within normal limits  BLOOD GAS, ARTERIAL - Abnormal; Notable for the following:    pH, Arterial 7.287 (*)    pCO2 arterial 53.6 (*)    pO2, Arterial 340.0 (*)    Bicarbonate 24.8 (*)    All other components within normal limits  URINE CULTURE  TROPONIN I  URINALYSIS, ROUTINE W REFLEX MICROSCOPIC  I-STAT CHEM 8, ED  I-STAT CG4 LACTIC ACID, ED    Imaging Review Dg Chest Portable 1 View  02/09/2015   CLINICAL DATA:  Evaluation for endotracheal tube, shortness of breath, history of COPD  EXAM: PORTABLE CHEST - 1 VIEW  COMPARISON:  12/29/2014  FINDINGS: Endotracheal tube tip about 4.5 cm above the carina. Mild cardiac enlargement.  Similar to prior study there is vascular congestion and moderate to severe interstitial prominence, the severity of which is minimally worse when compared to the prior study. Hyperinflation consistent with COPD.  IMPRESSION: Congestive heart failure with moderate to severe interstitial edema, similar but slightly worse when compared to prior study.   Electronically Signed   By: Esperanza Heir M.D.   On: 02/09/2015 10:49     EKG Interpretation   Date/Time:  Thursday February 09 2015 10:02:20 EDT Ventricular Rate:  115 PR Interval:  153 QRS Duration: 77 QT Interval:  362 QTC Calculation: 501 R Axis:   98 Text Interpretation:  Sinus tachycardia Multiple  premature complexes, vent   Sinus pause Right axis deviation ST depr, consider ischemia, inferior  leads Prolonged QT interval Artifact in lead(s) I II III aVR aVL aVF V1 V2  V5 V6 Confirmed by Shellene Sweigert  MD, Kersten Salmons [phone]) on 02/09/2015 10:10:56 AM      MDM   Final diagnoses:  Acute respiratory failure, unspecified whether with hypoxia or hypercapnia  Acute on chronic diastolic congestive heart failure    Patient in restaurant distress. Likely CHF exacerbation. Unable to complete sentences. Intubated by myself. Will admit to internal medicine to the ICU. Lasix given. Afebrile.   I personally performed the services described in this documentation, which was scribed in my presence. The recorded information has been reviewed and is accurate.   INTUBATION Performed by: Billee Cashing  Required items: required blood products, implants, devices, and special equipment available Patient identity confirmed: provided demographic data and hospital-assigned identification number Time out: Immediately prior to procedure a "time out" was called to verify the correct patient, procedure, equipment, support staff and site/side marked as required.  Indications: respiratory failure  Intubation method: Glidescope Laryngoscopy   Preoxygenation:  BVM  Sedatives: Etomidate Paralytic: roccuronium  Tube Size: 7.5 cuffed  Post-procedure assessment: chest rise and ETCO2 monitor Breath sounds: equal and absent over the epigastrium Tube secured with: ETT holder Chest x-ray interpreted by radiologist and me.  Chest x-ray findings: endotracheal tube in appropriate position  Patient tolerated the procedure well with no immediate complications.     Benjiman Core, MD 02/09/15 1139

## 2015-02-10 ENCOUNTER — Inpatient Hospital Stay (HOSPITAL_COMMUNITY): Payer: Medicare Other

## 2015-02-10 DIAGNOSIS — I340 Nonrheumatic mitral (valve) insufficiency: Secondary | ICD-10-CM

## 2015-02-10 DIAGNOSIS — R410 Disorientation, unspecified: Secondary | ICD-10-CM

## 2015-02-10 DIAGNOSIS — R9431 Abnormal electrocardiogram [ECG] [EKG]: Secondary | ICD-10-CM

## 2015-02-10 DIAGNOSIS — N183 Chronic kidney disease, stage 3 (moderate): Secondary | ICD-10-CM

## 2015-02-10 DIAGNOSIS — E039 Hypothyroidism, unspecified: Secondary | ICD-10-CM

## 2015-02-10 LAB — BLOOD GAS, ARTERIAL
ACID-BASE EXCESS: 1 mmol/L (ref 0.0–2.0)
Acid-Base Excess: 3.2 mmol/L — ABNORMAL HIGH (ref 0.0–2.0)
BICARBONATE: 27 meq/L — AB (ref 20.0–24.0)
Bicarbonate: 24.8 mEq/L — ABNORMAL HIGH (ref 20.0–24.0)
DRAWN BY: 21310
DRAWN BY: 27407
FIO2: 40 %
FIO2: 0.4 %
LHR: 15 {breaths}/min
MECHVT: 450 mL
MODE: POSITIVE
O2 Saturation: 98.9 %
O2 Saturation: 94.8 %
PATIENT TEMPERATURE: 37
PCO2 ART: 39.9 mmHg (ref 35.0–45.0)
PEEP/CPAP: 5 cmH2O
PEEP: 5 cmH2O
PH ART: 7.445 (ref 7.350–7.450)
PO2 ART: 128 mmHg — AB (ref 80.0–100.0)
Patient temperature: 37
Pressure support: 5 cmH2O
TCO2: 25.2 mmol/L (ref 0–100)
TCO2: 21 mmol/L (ref 0–100)
pCO2 arterial: 37.3 mmHg (ref 35.0–45.0)
pH, Arterial: 7.437 (ref 7.350–7.450)
pO2, Arterial: 74.7 mmHg — ABNORMAL LOW (ref 80.0–100.0)

## 2015-02-10 LAB — BASIC METABOLIC PANEL
ANION GAP: 12 (ref 5–15)
Anion gap: 14 (ref 5–15)
BUN: 32 mg/dL — ABNORMAL HIGH (ref 6–23)
BUN: 35 mg/dL — ABNORMAL HIGH (ref 6–23)
CALCIUM: 9 mg/dL (ref 8.4–10.5)
CALCIUM: 8.9 mg/dL (ref 8.4–10.5)
CHLORIDE: 104 mmol/L (ref 96–112)
CHLORIDE: 103 mmol/L (ref 96–112)
CO2: 27 mmol/L (ref 19–32)
CO2: 25 mmol/L (ref 19–32)
Creatinine, Ser: 1.4 mg/dL — ABNORMAL HIGH (ref 0.50–1.10)
Creatinine, Ser: 1.64 mg/dL — ABNORMAL HIGH (ref 0.50–1.10)
GFR calc Af Amer: 40 mL/min — ABNORMAL LOW (ref >90)
GFR calc non Af Amer: 35 mL/min — ABNORMAL LOW (ref >90)
GFR calc non Af Amer: 29 mL/min — ABNORMAL LOW (ref >90)
GFR, EST AFRICAN AMERICAN: 33 mL/min — AB (ref >90)
Glucose, Bld: 146 mg/dL — ABNORMAL HIGH (ref 70–99)
Glucose, Bld: 169 mg/dL — ABNORMAL HIGH (ref 70–99)
Potassium: 3.6 mmol/L (ref 3.5–5.1)
Potassium: 3.3 mmol/L — ABNORMAL LOW (ref 3.5–5.1)
SODIUM: 143 mmol/L (ref 135–145)
Sodium: 142 mmol/L (ref 135–145)

## 2015-02-10 LAB — CBC
HEMATOCRIT: 30.1 % — AB (ref 36.0–46.0)
Hemoglobin: 9.6 g/dL — ABNORMAL LOW (ref 12.0–15.0)
MCH: 31.5 pg (ref 26.0–34.0)
MCHC: 31.9 g/dL (ref 30.0–36.0)
MCV: 98.7 fL (ref 78.0–100.0)
Platelets: 189 10*3/uL (ref 150–400)
RBC: 3.05 MIL/uL — AB (ref 3.87–5.11)
RDW: 14 % (ref 11.5–15.5)
WBC: 11.3 10*3/uL — AB (ref 4.0–10.5)

## 2015-02-10 LAB — TROPONIN I
TROPONIN I: 0.59 ng/mL — AB (ref <0.031)
Troponin I: 0.68 ng/mL (ref <0.031)

## 2015-02-10 LAB — VITAMIN B12: Vitamin B-12: 339 pg/mL (ref 211–911)

## 2015-02-10 MED ORDER — ENOXAPARIN SODIUM 30 MG/0.3ML ~~LOC~~ SOLN: 30.0000 mg | SUBCUTANEOUS | Status: DC

## 2015-02-10 MED ORDER — CLOPIDOGREL BISULFATE 75 MG PO TABS
75.0000 mg | ORAL_TABLET | Freq: Every day | ORAL | Status: DC
  Administered 2015-02-10 – 2015-02-13 (×4): 75 mg via ORAL
  Filled 2015-02-10 (×4): qty 1

## 2015-02-10 MED ORDER — FOLIC ACID 1 MG PO TABS
1.0000 mg | ORAL_TABLET | Freq: Every day | ORAL | Status: DC
  Administered 2015-02-10 – 2015-02-20 (×11): 1 mg via ORAL
  Filled 2015-02-10 (×11): qty 1

## 2015-02-10 MED ORDER — PROPOFOL 10 MG/ML IV EMUL: 0.0000 ug/kg/min | INTRAVENOUS | Status: DC

## 2015-02-10 MED ORDER — CITALOPRAM HYDROBROMIDE 20 MG PO TABS
40.0000 mg | ORAL_TABLET | Freq: Every day | ORAL | Status: DC
  Administered 2015-02-10: 40 mg via ORAL
  Filled 2015-02-10: qty 2

## 2015-02-10 MED ORDER — TRAZODONE HCL 50 MG PO TABS
150.0000 mg | ORAL_TABLET | Freq: Every day | ORAL | Status: DC
  Administered 2015-02-10 – 2015-02-19 (×10): 150 mg via ORAL
  Filled 2015-02-10 (×10): qty 3

## 2015-02-10 MED ORDER — ATORVASTATIN CALCIUM 20 MG PO TABS
20.0000 mg | ORAL_TABLET | Freq: Every day | ORAL | Status: DC
  Administered 2015-02-10 – 2015-02-20 (×11): 20 mg via ORAL
  Filled 2015-02-10 (×11): qty 1

## 2015-02-10 MED ORDER — LEVOTHYROXINE SODIUM 100 MCG PO TABS
100.0000 ug | ORAL_TABLET | Freq: Every day | ORAL | Status: DC
  Administered 2015-02-11 – 2015-02-20 (×10): 100 ug via ORAL
  Filled 2015-02-10 (×9): qty 1

## 2015-02-10 MED ORDER — FUROSEMIDE 10 MG/ML IJ SOLN
40.0000 mg | Freq: Three times a day (TID) | INTRAMUSCULAR | Status: DC
Start: 2015-02-10 — End: 2015-02-11
  Administered 2015-02-10 (×2): 40 mg via INTRAVENOUS
  Filled 2015-02-10 (×2): qty 4

## 2015-02-10 MED ORDER — HYDROCODONE-ACETAMINOPHEN 7.5-325 MG PO TABS
1.0000 | ORAL_TABLET | Freq: Three times a day (TID) | ORAL | Status: DC | PRN
  Administered 2015-02-10 – 2015-02-15 (×12): 1 via ORAL
  Filled 2015-02-10 (×12): qty 1

## 2015-02-10 NOTE — Care Management Note (Addendum)
    Page 1 of 2   02/20/2015     10:14:07 AM CARE MANAGEMENT NOTE 02/20/2015  Patient:  Sierra Singleton,Sierra Singleton   Account Number:  [id number]  Date Initiated:  02/10/2015  Documentation initiated by:  Kathyrn SheriffHILDRESS,JESSICA  Subjective/Objective Assessment:   Pt is from home, lives alone but has a family member Sierra Singleton(Sierra Singleton) who helps. [facility] and has a walker and cane for PRN use. [facility]. Pt unsure if she would like to have [facility] in the     Action/Plan:   futer if needed. Pt provided with a list of HH agecnies [place] Pt plans to discharge home. Anticipate need for HH at discharge Will cont to follow.   Anticipated DC Date:  02/17/2015   Anticipated DC Plan:  SKILLED NURSING FACILITY  In-house referral  Clinical Social Worker      DC Planning Services  CM consult      Choice offered to / List presented to:             Status of service:  Completed, signed off Medicare Important Message given?  YES (If response is "NO", the following Medicare IM given date fields will be blank) Date Medicare IM given:  02/10/2015 Medicare IM given by:  Kathyrn SheriffHILDRESS,JESSICA Date Additional Medicare IM given:  02/20/2015 Additional Medicare IM given by:  Sharrie RothmanAMMY C Maleka Contino  Discharge Disposition:  SKILLED NURSING FACILITY  Per UR Regulation:  Reviewed for med. necessity/level of care/duration of stay  If discussed at Long Length of Stay Meetings, dates discussed:   02/14/2015  02/16/2015    Comments:  02/20/15 1010 Arlyss Queenammy Jarrah Babich, RN BSN CM [facility]. CSW to arrange discharge to facility.  02/17/15 1400 Arlyss Queenammy Tylin Force, RN BSN CM Pt BP low due to medications. MD ordered IVF to assist BP to rise. Anticipate discharge to SNF within 24 hours. CSW to arrange discharge to facility. [facility]  02/15/15 1115 Arlyss Queenammy Rondle Lohse, RN BSN CM Pt  [facility] in [place] which is her preference. CSW to arrange discharge to facility when medically stable.  02/14/2015 1400 Kathyrn SheriffJessica Childress, RN, MSN, CM Pt planning to go to SNF at discharge. CSW aware and to arrange for placement. 02/10/2015 1400 Kathyrn SheriffJessica Childress, RN, MSN, CM

## 2015-02-10 NOTE — Procedures (Signed)
Extubation Procedure Note  Patient Details:   Name: Sierra Singleton DOB: August 24, 1933 MRN: [id number]   Airway Documentation:     Evaluation  O2 sats: stable throughout Complications: No apparent complications Patient did tolerate procedure well. Bilateral Breath Sounds: Diminished Suctioning: Oral, Airway Yes   Pt extubated per MD order . Placed on 3L nasal cannula.  Pt tolerating well this time.  RT will continue to monitor.   [name], [name] [name] 02/10/2015, 9:29 AM

## 2015-02-10 NOTE — Progress Notes (Signed)
  Echocardiogram 2D Echocardiogram has been performed.  Stacey DrainWhite, Nilam Quakenbush J 02/10/2015, 3:57 PM

## 2015-02-10 NOTE — Progress Notes (Signed)
CRITICAL VALUE ALERT  Critical value received:  Troponin 0.68  Date of notification:  02/10/15  Time of notification:  0945  Critical value read back:Yes.    Nurse who received alert:  N.Tansy Lorek,RN  MD notified (1st page):  Irene LimboGoodrich  Time of first page:  [phone]  MD notified (2nd page):  Time of second page:  Responding MD:  Irene LimboGoodrich  Time MD responded:  0951-no new orders at this time

## 2015-02-10 NOTE — Consult Note (Signed)
CARDIOLOGY CONSULT NOTE   Patient ID: Sierra KalesShelby J Otani MRN: [id number] DOB/AGE: 79-30-1934 [age] y.o.  Admit Date: 02/09/2015 Referring Physician: PTH Primary Physician: Ignatius SpeckingVYAS,DHRUV B., MD Consulting Cardiologist: Prentice DockerKoneswaran, Suresh  Primary Cardiologist Charlton HawsNishan, Peter Reason for Consultation: Diastolic CHF  Clinical Summary Sierra Singleton is a 81 y.o.female with known history of Hailey-Hailey skin disease, hypertension, diastolic CHF, COPD, hyperlipidemia, hypothyroidism, and anxiety. [facility] after admission for A/C diastolic CHF and sent home on lasix 40 mg daily.   She presented to ER hypoxic and was intubated without hx obtained. On arrival BP 142/123, HR 123, troponin 0.59, BNP 676, Potassium 3.4. Hgb 10.5 Hct 33.8. CXR Congestive heart failure with moderate to severe interstitial edema,EKG NSR with PVC's. After intubation, she was given 40 mg IV and has been continued on IV lasix 40 mg BID. She has diuresed 1 liter since admission. She has been extubated this am.   She is still slightly confused but denies medical non-compliance. She is evasive concerning dietary compliance.   Allergies  Allergen Reactions  . Ambien [Zolpidem Tartrate]   . Sulfa Antibiotics Nausea And Vomiting  . Clindamycin/Lincomycin Swelling and Rash    Medications Scheduled Medications: . antiseptic oral rinse  7 mL Mouth Rinse BID  . chlorhexidine  15 mL Mouth/Throat BID  . Chlorhexidine Gluconate Cloth  6 each Topical Q0600  . enoxaparin (LOVENOX) injection  40 mg Subcutaneous Q24H  . furosemide  40 mg Intravenous BID  . ipratropium-albuterol  3 mL Nebulization Q4H  . levothyroxine  50 mcg Intravenous Daily  . mupirocin ointment   Nasal BID  . sodium chloride  3 mL Intravenous Q12H    Infusions: . propofol 50 mcg/kg/min (02/10/15 0600)    PRN Medications: sodium chloride, docusate, fentaNYL, fentaNYL, ondansetron **OR** ondansetron (ZOFRAN) IV, sodium  chloride   Past Medical History  Diagnosis Date  . Rash     Zebedee IbaHaley Haley rash  . Anxiety   . Thyroid disease   . Hyperlipidemia   . Hypertension   . COPD (chronic obstructive pulmonary disease)   . Poor historian   . Carotid artery disease     Past Surgical History  Procedure Laterality Date  . Back surgery      Family History  Problem Relation Age of Onset  . Cancer Father   . Stroke Mother     Social History Sierra Singleton reports that she has never smoked. She does not have any smokeless tobacco history on file. Sierra Singleton reports that she does not drink alcohol.  Review of Systems Complete review of systems are found to be negative unless outlined in H&P above.  Physical Examination Blood pressure 126/36, pulse 69, temperature 97.2 F (36.2 C), temperature source Axillary, resp. rate 15, height 5\' 2"  (1.575 m), weight 174 lb 6.1 oz (79.1 kg), SpO2 98 %.  Intake/Output Summary (Last 24 hours) at 02/10/15 1005 Last data filed at 02/10/15 0600  Gross per 24 hour  Intake 297.16 ml  Output   1300 ml  Net -1002.84 ml    Telemetry: Sinus tachycardia  YQM:VHQIOGEN:Awake, slightly confused, hungry HEENT: Conjunctiva and lids normal, oropharynx clear with moist mucosa. Neck: Supple, no elevated JVP or carotid bruits, no thyromegaly. Lungs:Inspiratory/expiratory crackles, with frequent coughing.  Cardiac: Regular rate and rhythm, tachycardic, no S3 or significant systolic murmur, no pericardial rub. Abdomen: Soft, nontender, no hepatomegaly, bowel sounds present, no guarding or rebound. Extremities: 1+ pitting edema, distal pulses 2+. Skin: Warm and dry. Musculoskeletal:  No kyphosis. Neuropsychiatric: Alert and oriented x3, affect grossly appropriate.  Prior Cardiac Testing/Procedures 1.Echocardiogram 12/30/2014 Procedure narrative: Transthoracic echocardiography. Image quality was suboptimal. The study was technically difficult, as a result of poor sound wave  transmission. - Left ventricle: The cavity size was normal. There was mild concentric hypertrophy. Systolic function was normal. The estimated ejection fraction was in the range of 60% to 65%. Images were inadequate for LV wall motion assessment. Features are consistent with a pseudonormal left ventricular filling pattern, with concomitant abnormal relaxation and increased filling pressure (grade 2 diastolic dysfunction). Doppler parameters are consistent with high ventricular filling pressure. - Aortic valve: Mildly calcified annulus. Trileaflet; mildly thickened leaflets. - Mitral valve: Calcified annulus. Mildly thickened leaflets . There was mild regurgitation. There may be a mild degree of mitral stenosis. - Left atrium: The atrium was moderately to severely dilated. - Right ventricle: The cavity size was mildly dilated. Wall thickness was normal. - Right atrium: The atrium was mildly to moderately dilated. - Tricuspid valve: There was mild-moderate regurgitation. - Pulmonary arteries: PA peak pressure: 77 mm Hg (S). Severely elevated pulmonary pressures. - Inferior vena cava: The vessel was dilated. The respirophasic diameter changes were blunted (< 50%), consistent with elevated central venous pressure. Lab Results  Basic Metabolic Panel:  Recent Labs Lab 02/09/15 1030 02/10/15 0508  NA 140 143  K 3.4* 3.6  CL 106 104  CO2 29 27  GLUCOSE 211* 146*  BUN 29* 32*  CREATININE 1.40* 1.40*  CALCIUM 8.8 9.0    Liver Function Tests:  Recent Labs Lab 02/09/15 1030  AST 27  ALT 14  ALKPHOS 71  BILITOT 0.7  PROT 7.1  ALBUMIN 3.4*    CBC:  Recent Labs Lab 02/09/15 1030 02/10/15 0508  WBC 14.8* 11.3*  NEUTROABS 7.7  --   HGB 10.5* 9.6*  HCT 33.8* 30.1*  MCV 103.0* 98.7  PLT 198 189    Cardiac Enzymes:  Recent Labs Lab 02/09/15 1030 02/09/15 2029 02/10/15 0246 02/10/15 0854  TROPONINI <0.03 0.59* 0.59* 0.68*     Radiology: Dg Chest Port 1 View  02/10/2015   CLINICAL DATA:  Acute respiratory failure  EXAM: PORTABLE CHEST - 1 VIEW  COMPARISON:  02/09/2015  FINDINGS: The endotracheal tube tip is 3 cm above the carina. The nasogastric tube extends into the stomach. Vascular and interstitial congestive changes persist. Central and basilar airspace opacities are unchanged or slightly worsened. The extreme bases are excluded from the image.  IMPRESSION: Support equipment appears satisfactorily positioned. Congestive heart failure, unchanged or slightly worsened.   Electronically Signed   By: Ellery Plunk M.D.   On: 02/10/2015 06:09   Dg Chest Portable 1 View  02/09/2015   CLINICAL DATA:  Evaluation for endotracheal tube, shortness of breath, history of COPD  EXAM: PORTABLE CHEST - 1 VIEW  COMPARISON:  12/29/2014  FINDINGS: Endotracheal tube tip about 4.5 cm above the carina. Mild cardiac enlargement. Similar to prior study there is vascular congestion and moderate to severe interstitial prominence, the severity of which is minimally worse when compared to the prior study. Hyperinflation consistent with COPD.  IMPRESSION: Congestive heart failure with moderate to severe interstitial edema, similar but slightly worse when compared to prior study.   Electronically Signed   By: Esperanza Heir M.D.   On: 02/09/2015 10:49     ECG:  NSR with ST depression in the inferior leads/ artifact is noted.    Impression and Recommendations  1.Acute Hypoxic Respiratory Failure: Now  extubated and on O2. Continues lung congestion and frequent coughing. Hungry. Still slightly confused.  2. Acute Diastolic CHF with interstitial edema: She has diuresed 1 liter and has been extubated. Still with some fluid overload. Creatinine 1.40,(1.57 on discharge last month) CO2 27. Wt 174 lbs, (discharge wt on 01/02/2015 179 lbs.) Review of home medications has her on lasix 40 mg daily. Continue IV lasix for one dose and then transition to  po in am. Follow up BMET.   3. NSTEMI: Likely from demand ischemia. Repeat EKG now that she is extubated for changes. Will repeat LIMITED echo as she has lung involvement and hypoxia for changes in LV fx. Will restart back  low dose metoprolol 25 mg BID.   4. Anxiety: She is slightly confused this am. Don't know if it this is baseline for her or related to hypoxic episode.    Signed: Bettey Mare. Lawrence NP [facility]  02/10/2015, 10:05 AM Co-Sign MD  The patient was seen and examined, and I agree with the assessment and plan as documented above, with modifications as noted below. Pt known to me from prior hospitalization in February admitted with acute hypoxic respiratory failure requiring intubation, deemed secondary to acute on chronic diastolic heart failure. She is very difficult historian and I am thus unable to ascertain what may have contributed to this. It does not appear she has had sodium excess as someone else (a nurse) cooks for her and tries to give low-sodium foods. The patient denies chest pain. Troponins mildly elevated and in context of Hgb 9.6, this may be related to demand ischemia. Admission ECG with significant artifact and a repeat ECG has been ordered to assess whether or not there are new ischemic changes. Chest xray today shows CHF which is unchanged or perhaps slightly worse. I will increase Lasix frequency to 40 mg IV tid. She has had roughly 1 liter output thus far. I will repeat a limited echo with contrast to assess for a change in LV systolic function and/or regional wall motion to see if ischemic injury is in fact playing a role.

## 2015-02-10 NOTE — Progress Notes (Signed)
Pt placed on C/PS 5/5 for SBT.  Pt tolerating well at this time.  RT will continue to monitor.

## 2015-02-10 NOTE — Progress Notes (Addendum)
Subjective: She remains intubated and on the ventilator. No new problems have been noted overnight  Objective: Vital signs in last 24 hours: Temp:  [97.2 F (36.2 C)-98.7 F (37.1 C)] 97.2 F (36.2 C) (03/18 0500) Pulse Rate:  [57-123] 77 (03/17 2000) Resp:  [13-31] 17 (03/17 2000) BP: (110-172)/(34-123) 158/47 mmHg (03/17 2000) SpO2:  [92 %-100 %] 99 % (03/18 0314) FiO2 (%):  [40 %-100 %] 40 % (03/18 0701) Weight:  [77.5 kg (170 lb 13.7 oz)-79.1 kg (174 lb 6.1 oz)] 79.1 kg (174 lb 6.1 oz) (03/18 0500) Weight change:  Last BM Date:  (unknown)  Intake/Output from previous day: 03/17 0701 - 03/18 0700 In: 120.7 [I.V.:120.7] Out: 1300 [Urine:1300]  PHYSICAL EXAM General appearance: Intubated and sedated Resp: rhonchi bilaterally Cardio: regular rate and rhythm, S1, S2 normal, no murmur, click, rub or gallop GI: soft, non-tender; bowel sounds normal; no masses,  no organomegaly Extremities: extremities normal, atraumatic, no cyanosis or edema  Lab Results:  Results for orders placed or performed during the hospital encounter of 02/09/15 (from the past 48 hour(s))  Troponin I     Status: None   Collection Time: 02/09/15 10:30 AM  Result Value Ref Range   Troponin I <0.03 <0.031 ng/mL    Comment:        NO INDICATION OF MYOCARDIAL INJURY.   Brain natriuretic peptide     Status: Abnormal   Collection Time: 02/09/15 10:30 AM  Result Value Ref Range   B Natriuretic Peptide 676.0 (H) 0.0 - 100.0 pg/mL  Comprehensive metabolic panel     Status: Abnormal   Collection Time: 02/09/15 10:30 AM  Result Value Ref Range   Sodium 140 135 - 145 mmol/L   Potassium 3.4 (L) 3.5 - 5.1 mmol/L   Chloride 106 96 - 112 mmol/L   CO2 29 19 - 32 mmol/L   Glucose, Bld 211 (H) 70 - 99 mg/dL   BUN 29 (H) 6 - 23 mg/dL   Creatinine, Ser 1.40 (H) 0.50 - 1.10 mg/dL   Calcium 8.8 8.4 - 10.5 mg/dL   Total Protein 7.1 6.0 - 8.3 g/dL   Albumin 3.4 (L) 3.5 - 5.2 g/dL   AST 27 0 - 37 U/L   ALT 14 0 -  35 U/L   Alkaline Phosphatase 71 39 - 117 U/L   Total Bilirubin 0.7 0.3 - 1.2 mg/dL   GFR calc non Af Amer 35 (L) >90 mL/min   GFR calc Af Amer 40 (L) >90 mL/min    Comment: (NOTE) The eGFR has been calculated using the CKD EPI equation. This calculation has not been validated in all clinical situations. eGFR's persistently <90 mL/min signify possible Chronic Kidney Disease.    Anion gap 5 5 - 15  CBC with Differential     Status: Abnormal   Collection Time: 02/09/15 10:30 AM  Result Value Ref Range   WBC 14.8 (H) 4.0 - 10.5 K/uL   RBC 3.28 (L) 3.87 - 5.11 MIL/uL   Hemoglobin 10.5 (L) 12.0 - 15.0 g/dL   HCT 33.8 (L) 36.0 - 46.0 %   MCV 103.0 (H) 78.0 - 100.0 fL   MCH 32.0 26.0 - 34.0 pg   MCHC 31.1 30.0 - 36.0 g/dL   RDW 14.2 11.5 - 15.5 %   Platelets 198 150 - 400 K/uL   Neutrophils Relative % 52 43 - 77 %   Neutro Abs 7.7 1.7 - 7.7 K/uL   Lymphocytes Relative 40 12 - 46 %  Lymphs Abs 5.9 (H) 0.7 - 4.0 K/uL   Monocytes Relative 3 3 - 12 %   Monocytes Absolute 0.4 0.1 - 1.0 K/uL   Eosinophils Relative 5 0 - 5 %   Eosinophils Absolute 0.8 (H) 0.0 - 0.7 K/uL   Basophils Relative 0 0 - 1 %   Basophils Absolute 0.1 0.0 - 0.1 K/uL  TSH     Status: None   Collection Time: 02/09/15 10:30 AM  Result Value Ref Range   TSH 1.960 0.350 - 4.500 uIU/mL  I-Stat CG4 Lactic Acid, ED     Status: None   Collection Time: 02/09/15 10:36 AM  Result Value Ref Range   Lactic Acid, Venous 1.21 0.5 - 2.0 mmol/L  Blood gas, arterial     Status: Abnormal   Collection Time: 02/09/15 11:15 AM  Result Value Ref Range   FIO2 100.00 %   Delivery systems VENTILATOR    Mode PRESSURE REGULATED VOLUME CONTROL    VT 400 mL   Rate 15 resp/min   Peep/cpap 5.0 cm H20   pH, Arterial 7.287 (L) 7.350 - 7.450   pCO2 arterial 53.6 (H) 35.0 - 45.0 mmHg   pO2, Arterial 340.0 (H) 80.0 - 100.0 mmHg   Bicarbonate 24.8 (H) 20.0 - 24.0 mEq/L   TCO2 22.7 0 - 100 mmol/L   Acid-base deficit 0.9 0.0 - 2.0 mmol/L    O2 Saturation 99.3 %   Patient temperature 37.0    Collection site LEFT RADIAL    Drawn by [id number]    Sample type ARTERIAL    Allens test (pass/fail) PASS PASS  Urinalysis, Routine w reflex microscopic     Status: Abnormal   Collection Time: 02/09/15 11:25 AM  Result Value Ref Range   Color, Urine YELLOW YELLOW   APPearance CLEAR CLEAR   Specific Gravity, Urine 1.025 1.005 - 1.030   pH 6.0 5.0 - 8.0   Glucose, UA NEGATIVE NEGATIVE mg/dL   Hgb urine dipstick MODERATE (A) NEGATIVE   Bilirubin Urine NEGATIVE NEGATIVE   Ketones, ur NEGATIVE NEGATIVE mg/dL   Protein, ur >300 (A) NEGATIVE mg/dL   Urobilinogen, UA 0.2 0.0 - 1.0 mg/dL   Nitrite NEGATIVE NEGATIVE   Leukocytes, UA NEGATIVE NEGATIVE  Urine microscopic-add on     Status: Abnormal   Collection Time: 02/09/15 11:25 AM  Result Value Ref Range   RBC / HPF 7-10 <3 RBC/hpf   Bacteria, UA MANY (A) RARE  MRSA PCR Screening     Status: Abnormal   Collection Time: 02/09/15  2:40 PM  Result Value Ref Range   MRSA by PCR POSITIVE (A) NEGATIVE    Comment:        The GeneXpert MRSA Assay (FDA approved for NASAL specimens only), is one component of a comprehensive MRSA colonization surveillance program. It is not intended to diagnose MRSA infection nor to guide or monitor treatment for MRSA infections. RESULT CALLED TO, READ BACK BY AND VERIFIED WITH: COFFEE,J ON 02/09/15 _0  BY MITCHELL,S   Triglycerides     Status: None   Collection Time: 02/09/15  8:04 PM  Result Value Ref Range   Triglycerides 121 <150 mg/dL  Troponin I (q 6hr x 3)     Status: Abnormal   Collection Time: 02/09/15  8:29 PM  Result Value Ref Range   Troponin I 0.59 (HH) <0.031 ng/mL    Comment:        POSSIBLE MYOCARDIAL ISCHEMIA. SERIAL TESTING RECOMMENDED. CRITICAL RESULT CALLED TO, READ BACK BY  AND VERIFIED WITH: DANIEL,J ON 02/09/15 AT 2215 BY LOY,C   Troponin I (q 6hr x 3)     Status: Abnormal   Collection Time: 02/10/15  2:46 AM  Result Value  Ref Range   Troponin I 0.59 (HH) <0.031 ng/mL    Comment: CRITICAL VALUE NOTED.  VALUE IS CONSISTENT WITH PREVIOUSLY REPORTED AND CALLED VALUE.        POSSIBLE MYOCARDIAL ISCHEMIA. SERIAL TESTING RECOMMENDED.   Blood gas, arterial     Status: Abnormal   Collection Time: 02/10/15  4:35 AM  Result Value Ref Range   FIO2 40.00 %   Delivery systems VENTILATOR    Mode PRESSURE REGULATED VOLUME CONTROL    VT 450 mL   Rate 15.0 resp/min   Peep/cpap 5.0 cm H20   pH, Arterial 7.445 7.350 - 7.450   pCO2 arterial 39.9 35.0 - 45.0 mmHg   pO2, Arterial 128.0 (H) 80.0 - 100.0 mmHg   Bicarbonate 27.0 (H) 20.0 - 24.0 mEq/L   TCO2 25.2 0 - 100 mmol/L   Acid-Base Excess 3.2 (H) 0.0 - 2.0 mmol/L   O2 Saturation 98.9 %   Patient temperature 37.0    Collection site LEFT RADIAL    Drawn by [id number]    Sample type ARTERIAL    Allens test (pass/fail) PASS PASS  Basic metabolic panel     Status: Abnormal   Collection Time: 02/10/15  5:08 AM  Result Value Ref Range   Sodium 143 135 - 145 mmol/L   Potassium 3.6 3.5 - 5.1 mmol/L   Chloride 104 96 - 112 mmol/L   CO2 27 19 - 32 mmol/L   Glucose, Bld 146 (H) 70 - 99 mg/dL   BUN 32 (H) 6 - 23 mg/dL   Creatinine, Ser 1.40 (H) 0.50 - 1.10 mg/dL   Calcium 9.0 8.4 - 10.5 mg/dL   GFR calc non Af Amer 35 (L) >90 mL/min   GFR calc Af Amer 40 (L) >90 mL/min    Comment: (NOTE) The eGFR has been calculated using the CKD EPI equation. This calculation has not been validated in all clinical situations. eGFR's persistently <90 mL/min signify possible Chronic Kidney Disease.    Anion gap 12 5 - 15  CBC     Status: Abnormal   Collection Time: 02/10/15  5:08 AM  Result Value Ref Range   WBC 11.3 (H) 4.0 - 10.5 K/uL   RBC 3.05 (L) 3.87 - 5.11 MIL/uL   Hemoglobin 9.6 (L) 12.0 - 15.0 g/dL   HCT 30.1 (L) 36.0 - 46.0 %   MCV 98.7 78.0 - 100.0 fL   MCH 31.5 26.0 - 34.0 pg   MCHC 31.9 30.0 - 36.0 g/dL   RDW 14.0 11.5 - 15.5 %   Platelets 189 150 - 400 K/uL     ABGS  Recent Labs  02/10/15 0435  PHART 7.445  PO2ART 128.0*  TCO2 25.2  HCO3 27.0*   CULTURES Recent Results (from the past 240 hour(s))  MRSA PCR Screening     Status: Abnormal   Collection Time: 02/09/15  2:40 PM  Result Value Ref Range Status   MRSA by PCR POSITIVE (A) NEGATIVE Final    Comment:        The GeneXpert MRSA Assay (FDA approved for NASAL specimens only), is one component of a comprehensive MRSA colonization surveillance program. It is not intended to diagnose MRSA infection nor to guide or monitor treatment for MRSA infections. RESULT CALLED TO, READ BACK BY  AND VERIFIED WITH: COFFEE,J ON 02/09/15 _0  BY MITCHELL,S    Studies/Results: Dg Chest Port 1 View  02/10/2015   CLINICAL DATA:  Acute respiratory failure  EXAM: PORTABLE CHEST - 1 VIEW  COMPARISON:  02/09/2015  FINDINGS: The endotracheal tube tip is 3 cm above the carina. The nasogastric tube extends into the stomach. Vascular and interstitial congestive changes persist. Central and basilar airspace opacities are unchanged or slightly worsened. The extreme bases are excluded from the image.  IMPRESSION: Support equipment appears satisfactorily positioned. Congestive heart failure, unchanged or slightly worsened.   Electronically Signed   By: Andreas Newport M.D.   On: 02/10/2015 06:09   Dg Chest Portable 1 View  02/09/2015   CLINICAL DATA:  Evaluation for endotracheal tube, shortness of breath, history of COPD  EXAM: PORTABLE CHEST - 1 VIEW  COMPARISON:  12/29/2014  FINDINGS: Endotracheal tube tip about 4.5 cm above the carina. Mild cardiac enlargement. Similar to prior study there is vascular congestion and moderate to severe interstitial prominence, the severity of which is minimally worse when compared to the prior study. Hyperinflation consistent with COPD.  IMPRESSION: Congestive heart failure with moderate to severe interstitial edema, similar but slightly worse when compared to prior study.    Electronically Signed   By: Skipper Cliche M.D.   On: 02/09/2015 10:49    Medications:  Prior to Admission:  Prescriptions prior to admission  Medication Sig Dispense Refill Last Dose  . atorvastatin (LIPITOR) 20 MG tablet Take 20 mg by mouth daily.   12/28/2014 at Unknown time  . citalopram (CELEXA) 40 MG tablet Take 40 mg by mouth daily.   12/28/2014 at Unknown time  . clopidogrel (PLAVIX) 75 MG tablet Take 75 mg by mouth daily.   12/28/2014 at Unknown time  . folic acid (FOLVITE) 1 MG tablet Take 1 mg by mouth daily.   12/28/2014 at Unknown time  . furosemide (LASIX) 40 MG tablet Take 1 tablet (40 mg total) by mouth daily. 30 tablet 1   . HYDROcodone-acetaminophen (NORCO) 7.5-325 MG per tablet Take 1 tablet by mouth 3 (three) times daily as needed for moderate pain. 30 tablet 0   . levothyroxine (SYNTHROID, LEVOTHROID) 100 MCG tablet Take 100 mcg by mouth daily before breakfast.   12/28/2014 at Unknown time  . methotrexate 2.5 MG tablet Take 10 mg by mouth once a week. On Sunday.   12/25/2013  . metoprolol tartrate (LOPRESSOR) 25 MG tablet Take 1 tablet (25 mg total) by mouth 2 (two) times daily. 60 tablet 1   . traZODone (DESYREL) 150 MG tablet Take by mouth at bedtime.   12/28/2014 at Unknown time   Scheduled: . antiseptic oral rinse  7 mL Mouth Rinse BID  . chlorhexidine  15 mL Mouth/Throat BID  . Chlorhexidine Gluconate Cloth  6 each Topical Q0600  . enoxaparin (LOVENOX) injection  40 mg Subcutaneous Q24H  . furosemide  40 mg Intravenous BID  . ipratropium-albuterol  3 mL Nebulization Q4H  . levothyroxine  50 mcg Intravenous Daily  . mupirocin ointment   Nasal BID  . sodium chloride  3 mL Intravenous Q12H   Continuous: . propofol 40 mcg/kg/min (02/10/15 0429)   XKG:YJEHUD chloride, docusate, fentaNYL, fentaNYL, ondansetron **OR** ondansetron (ZOFRAN) IV, sodium chloride  Assesment: She has acute hypoxic and hypercapnic respiratory failure. She has been intubated and placed on ventilator.  Chest x-ray is suggestive that this is mostly from heart failure but she does have a history of COPD and is  on oxygen at home. I think she may be able to come off the ventilator today. Her chest x-ray is basically unchanged. She is down about 2 L on I&O. Her troponin is elevated.  Principal Problem:   Acute respiratory failure with hypoxemia Active Problems:   Endotracheally intubated   Diastolic CHF, acute on chronic   Macrocytic anemia   ARF (acute renal failure)   Hypothyroidism    Plan: Attempt weaning and extubation    LOS: 1 day   Phylis Javed L 02/10/2015, 7:42 AM

## 2015-02-10 NOTE — Progress Notes (Signed)
PROGRESS NOTE  Sierra Singleton:[id number] DOB: Aug 02, 1933 DOA: 02/09/2015 PCP: Ignatius Specking., MD  Summary: 3yow PMH grade 2 diastolic dysfunction, oxygen-dependent COPD, presented with acute resp distress and was intubated immediately in ED. Admitted for acute resp failure, acute on chronic diastolic CHF, acute kidney injury  Assessment/Plan: 1. Acute hypoxic and hypercapnic respiratory failure, presumed secondary to acute diastolic heart failure.  2. Acute on chronic diastolic CHF (grade 2). Negative fluid balance. 3. Positive troponin. Suspect demand ischemia secondary to heart failure. 4. Possible AKI appears resolved. CKD stage III, appears to be at baseline. 5. Macrocytic anemia, appears stable. 6. COPD 7. Chronic hypoxic respiratory failure on home oxygen. 8. Hypothyroidism. TSH normal.   Discussed with pulmonology. Seems to be improving on ventilator. Plan for extubation today as per pulmonology.  Continue diuresis. Cardiology consultation pending. Suspect elevated troponins reflect demand ischemia and not ACS. Await further recommendations.  Basic metabolic panel and CBC in the morning  Code Status: full code DVT prophylaxis: Lovenox Family Communication: none present Disposition Plan: pending  Brendia Sacks, MD  [facility]  Pager [phone] If 7PM-7AM, please contact night-coverage at [email or web address], password [facility] 02/10/2015, 8:49 AM  LOS: 1 day   Consultants:  Pulmonology   Cardiology   Procedures:  ETT 3/17 >>  Antibiotics:    HPI/Subjective: Intubated, awake, follows commands.  Pulmonology plans extubation.  Objective: Filed Vitals:   02/10/15 0400 02/10/15 0500 02/10/15 0600 02/10/15 0700  BP: 141/35 162/46 120/32 126/36  Pulse: 75 90 73 69  Temp:  97.2 F (36.2 C)    TempSrc:  Axillary    Resp: Height:      Weight:  79.1 kg (174 lb 6.1 oz)    SpO2: 99% 100% 95% 98%    Intake/Output Summary (Last 24 hours) at  02/10/15 0849 Last data filed at 02/10/15 0600  Gross per 24 hour  Intake 297.16 ml  Output   1300 ml  Net -1002.84 ml     Filed Weights   02/09/15 1615 02/10/15 0500  Weight: 77.5 kg (170 lb 13.7 oz) 79.1 kg (174 lb 6.1 oz)    Exam:     Afebrile, VSS General:  Appears calm and comfortable, intubated Eyes: Pupils, irises, lids appear grossly unremarkable ENT: grossly normal hearing, lips Cardiovascular: RRR, no m/r/g. No LE edema. Respiratory: CTA bilaterally, no w/r/r. Respiratory effort appears stable. Abdomen: soft, ntnd Skin: Rash noted over the chest and groin, maculopapular Musculoskeletal: grossly normal tone BUE/BLE, moves to command Psychiatric: grossly normal mood and affect, but limited exam Neurologic: grossly non-focal, follows commands  Data Reviewed:  UOP 1300  I/O -1 liter   ABG 7.287/53/340 >> 7.445/39/128  Creatinine stable 1.4  Troponin 0.03 >> 0.59 >> 0.59  Hgb stable 9.6  CXR 3/17: CHF >> 3/18: CHF  EKG ST  Scheduled Meds: . antiseptic oral rinse  7 mL Mouth Rinse BID  . chlorhexidine  15 mL Mouth/Throat BID  . Chlorhexidine Gluconate Cloth  6 each Topical Q0600  . enoxaparin (LOVENOX) injection  40 mg Subcutaneous Q24H  . furosemide  40 mg Intravenous BID  . ipratropium-albuterol  3 mL Nebulization Q4H  . levothyroxine  50 mcg Intravenous Daily  . mupirocin ointment   Nasal BID  . sodium chloride  3 mL Intravenous Q12H   Continuous Infusions: . propofol 50 mcg/kg/min (02/10/15 0600)    Principal Problem:   Acute respiratory failure with hypoxemia Active Problems:   Endotracheally intubated   Diastolic  CHF, acute on chronic   Macrocytic anemia   ARF (acute renal failure)   Hypothyroidism   Time spent 40 minutes, >50% in coordination of care

## 2015-02-11 ENCOUNTER — Encounter (HOSPITAL_COMMUNITY): Payer: Self-pay | Admitting: Family Medicine

## 2015-02-11 DIAGNOSIS — I4891 Unspecified atrial fibrillation: Secondary | ICD-10-CM

## 2015-02-11 DIAGNOSIS — I248 Other forms of acute ischemic heart disease: Secondary | ICD-10-CM

## 2015-02-11 DIAGNOSIS — J9611 Chronic respiratory failure with hypoxia: Secondary | ICD-10-CM

## 2015-02-11 HISTORY — DX: Chronic respiratory failure with hypoxia: J96.11

## 2015-02-11 HISTORY — DX: Unspecified atrial fibrillation: I48.91

## 2015-02-11 LAB — CBC
HCT: 31.2 % — ABNORMAL LOW (ref 36.0–46.0)
Hemoglobin: 9.9 g/dL — ABNORMAL LOW (ref 12.0–15.0)
MCH: 31.8 pg (ref 26.0–34.0)
MCHC: 31.7 g/dL (ref 30.0–36.0)
MCV: 100.3 fL — ABNORMAL HIGH (ref 78.0–100.0)
Platelets: 174 10*3/uL (ref 150–400)
RBC: 3.11 MIL/uL — ABNORMAL LOW (ref 3.87–5.11)
RDW: 14.8 % (ref 11.5–15.5)
WBC: 11 10*3/uL — ABNORMAL HIGH (ref 4.0–10.5)

## 2015-02-11 LAB — URINE CULTURE
COLONY COUNT: NO GROWTH
Culture: NO GROWTH

## 2015-02-11 LAB — BASIC METABOLIC PANEL
Anion gap: 9 (ref 5–15)
Anion gap: 11 (ref 5–15)
BUN: 33 mg/dL — ABNORMAL HIGH (ref 6–23)
BUN: 33 mg/dL — ABNORMAL HIGH (ref 6–23)
CALCIUM: 8.4 mg/dL (ref 8.4–10.5)
CHLORIDE: 99 mmol/L (ref 96–112)
CO2: 30 mmol/L (ref 19–32)
CO2: 30 mmol/L (ref 19–32)
CREATININE: 1.43 mg/dL — AB (ref 0.50–1.10)
Calcium: 8.2 mg/dL — ABNORMAL LOW (ref 8.4–10.5)
Chloride: 99 mmol/L (ref 96–112)
Creatinine, Ser: 1.41 mg/dL — ABNORMAL HIGH (ref 0.50–1.10)
GFR calc Af Amer: 39 mL/min — ABNORMAL LOW (ref >90)
GFR, EST AFRICAN AMERICAN: 40 mL/min — AB (ref >90)
GFR, EST NON AFRICAN AMERICAN: 34 mL/min — AB (ref >90)
GFR, EST NON AFRICAN AMERICAN: 34 mL/min — AB (ref >90)
Glucose, Bld: 130 mg/dL — ABNORMAL HIGH (ref 70–99)
Glucose, Bld: 102 mg/dL — ABNORMAL HIGH (ref 70–99)
POTASSIUM: 3.3 mmol/L — AB (ref 3.5–5.1)
Potassium: 3.4 mmol/L — ABNORMAL LOW (ref 3.5–5.1)
SODIUM: 140 mmol/L (ref 135–145)
Sodium: 138 mmol/L (ref 135–145)

## 2015-02-11 LAB — TROPONIN I: Troponin I: 0.7 ng/mL (ref <0.031)

## 2015-02-11 LAB — PROTIME-INR
INR: 1.23 (ref 0.00–1.49)
Prothrombin Time: 15.6 seconds — ABNORMAL HIGH (ref 11.6–15.2)

## 2015-02-11 LAB — HEPARIN LEVEL (UNFRACTIONATED): HEPARIN UNFRACTIONATED: 0.22 [IU]/mL — AB (ref 0.30–0.70)

## 2015-02-11 MED ORDER — POTASSIUM CHLORIDE 20 MEQ PO PACK
40.0000 meq | PACK | Freq: Two times a day (BID) | ORAL | Status: AC
  Administered 2015-02-11 (×2): 40 meq via ORAL
  Filled 2015-02-11 (×2): qty 2

## 2015-02-11 MED ORDER — DILTIAZEM HCL 25 MG/5ML IV SOLN
10.0000 mg | Freq: Once | INTRAVENOUS | Status: AC
  Administered 2015-02-11: 10 mg via INTRAVENOUS

## 2015-02-11 MED ORDER — IPRATROPIUM-ALBUTEROL 0.5-2.5 (3) MG/3ML IN SOLN
3.0000 mL | Freq: Four times a day (QID) | RESPIRATORY_TRACT | Status: DC
  Administered 2015-02-11: 3 mL via RESPIRATORY_TRACT
  Filled 2015-02-11: qty 3

## 2015-02-11 MED ORDER — HEPARIN BOLUS VIA INFUSION
4000.0000 [IU] | Freq: Once | INTRAVENOUS | Status: AC
  Administered 2015-02-11: 4000 [IU] via INTRAVENOUS
  Filled 2015-02-11: qty 4000

## 2015-02-11 MED ORDER — DILTIAZEM LOAD VIA INFUSION
10.0000 mg | Freq: Once | INTRAVENOUS | Status: AC
  Administered 2015-02-11: 10 mg via INTRAVENOUS
  Filled 2015-02-11: qty 10

## 2015-02-11 MED ORDER — FUROSEMIDE 10 MG/ML IJ SOLN
40.0000 mg | Freq: Two times a day (BID) | INTRAMUSCULAR | Status: DC
  Administered 2015-02-11 – 2015-02-12 (×3): 40 mg via INTRAVENOUS
  Filled 2015-02-11 (×3): qty 4

## 2015-02-11 MED ORDER — ALBUTEROL SULFATE (2.5 MG/3ML) 0.083% IN NEBU: 2.5000 mg | INHALATION_SOLUTION | RESPIRATORY_TRACT | Status: DC | PRN

## 2015-02-11 MED ORDER — METOPROLOL TARTRATE 1 MG/ML IV SOLN
5.0000 mg | Freq: Once | INTRAVENOUS | Status: AC
  Administered 2015-02-11: 5 mg via INTRAVENOUS
  Filled 2015-02-11: qty 5

## 2015-02-11 MED ORDER — LEVALBUTEROL HCL 0.63 MG/3ML IN NEBU
0.6300 mg | INHALATION_SOLUTION | Freq: Four times a day (QID) | RESPIRATORY_TRACT | Status: DC
  Administered 2015-02-11 (×2): 0.63 mg via RESPIRATORY_TRACT
  Filled 2015-02-11 (×2): qty 3

## 2015-02-11 MED ORDER — DILTIAZEM HCL 100 MG IV SOLR
5.0000 mg/h | INTRAVENOUS | Status: DC
  Administered 2015-02-11: 5 mg/h via INTRAVENOUS
  Administered 2015-02-11 – 2015-02-12 (×3): 15 mg/h via INTRAVENOUS
  Administered 2015-02-12: 5 mg/h via INTRAVENOUS
  Administered 2015-02-12: 15 mg/h via INTRAVENOUS
  Filled 2015-02-11: qty 100

## 2015-02-11 MED ORDER — MAGIC MOUTHWASH: 5.0000 mL | Freq: Three times a day (TID) | ORAL | Status: DC | PRN

## 2015-02-11 MED ORDER — CITALOPRAM HYDROBROMIDE 20 MG PO TABS
20.0000 mg | ORAL_TABLET | Freq: Every day | ORAL | Status: DC
  Administered 2015-02-11 – 2015-02-20 (×10): 20 mg via ORAL
  Filled 2015-02-11 (×10): qty 1

## 2015-02-11 MED ORDER — DILTIAZEM HCL 25 MG/5ML IV SOLN
INTRAVENOUS | Status: AC
  Filled 2015-02-11: qty 5

## 2015-02-11 MED ORDER — HEPARIN (PORCINE) IN NACL 100-0.45 UNIT/ML-% IJ SOLN
1400.0000 [IU]/h | INTRAMUSCULAR | Status: DC
  Administered 2015-02-11 (×2): 950 [IU]/h via INTRAVENOUS
  Administered 2015-02-12: 1050 [IU]/h via INTRAVENOUS
  Administered 2015-02-13: 1250 [IU]/h via INTRAVENOUS
  Filled 2015-02-11 (×3): qty 250

## 2015-02-11 NOTE — Progress Notes (Signed)
ANTICOAGULATION CONSULT NOTE  Pharmacy Consult for Heparin Indication: atrial fibrillation  Allergies  Allergen Reactions  . Ambien [Zolpidem Tartrate]   . Sulfa Antibiotics Nausea And Vomiting  . Clindamycin/Lincomycin Swelling and Rash    Patient Measurements: Height: 5\' 2"  (157.5 cm) Weight: 166 lb 0.1 oz (75.3 kg) IBW/kg (Calculated) : 50.1 Heparin Dosing Weight: 66.4 kg  Vital Signs: Temp: 98.2 F (36.8 C) (03/19 2000) Temp Source: Oral (03/19 2000) BP: 96/52 mmHg (03/19 2000) Pulse Rate: 135 (03/19 2000)  Labs:  Recent Labs  02/09/15 1030  02/10/15 0246 02/10/15 0508 02/10/15 0854 02/10/15 1301 02/11/15 0026 02/11/15 0547 02/11/15 0912 02/11/15 1923  HGB 10.5*  --   --  9.6*  --   --   --  9.9*  --   --   HCT 33.8*  --   --  30.1*  --   --   --  31.2*  --   --   PLT 198  --   --  189  --   --   --  174  --   --   LABPROT  --   --   --   --   --   --   --   --  15.6*  --   INR  --   --   --   --   --   --   --   --  1.23  --   HEPARINUNFRC  --   --   --   --   --   --   --   --   --  0.22*  CREATININE 1.40*  --   --  1.40*  --  1.64* 1.43* 1.41*  --   --   TROPONINI <0.03  < > 0.59*  --  0.68*  --   --   --  0.70*  --   < > = values in this interval not displayed.  Estimated Creatinine Clearance: 30.7 mL/min (by C-G formula based on Cr of 1.41).   Medical History: Past Medical History  Diagnosis Date  . Rash     Sierra Singleton Singleton Sierra Singleton rash  . Anxiety   . Thyroid disease   . Hyperlipidemia   . Hypertension   . COPD (chronic obstructive pulmonary disease)   . Poor historian   . Carotid artery disease   . Atrial fibrillation 02/11/2015  . Chronic respiratory failure with hypoxia 02/11/2015    Medications:  Prescriptions prior to admission  Medication Sig Dispense Refill Last Dose  . atorvastatin (LIPITOR) 20 MG tablet Take 20 mg by mouth daily.   02/08/2015  . citalopram (CELEXA) 40 MG tablet Take 40 mg by mouth daily.   02/08/2015  . clopidogrel (PLAVIX) 75  MG tablet Take 75 mg by mouth daily.   02/08/2015  . folic acid (FOLVITE) 1 MG tablet Take 1 mg by mouth daily.   02/08/2015  . furosemide (LASIX) 40 MG tablet Take 1 tablet (40 mg total) by mouth daily. 30 tablet 1 02/08/2015  . HYDROcodone-acetaminophen (NORCO) 7.5-325 MG per tablet Take 1 tablet by mouth 3 (three) times daily as needed for moderate pain. 30 tablet 0 02/08/2015  . levothyroxine (SYNTHROID, LEVOTHROID) 100 MCG tablet Take 100 mcg by mouth daily before breakfast.   02/08/2015  . methotrexate 2.5 MG tablet Take 10 mg by mouth once a week. On Sunday.   02/05/2015  . metoprolol tartrate (LOPRESSOR) 25 MG tablet Take 1 tablet (25 mg total) by mouth  2 (two) times daily. 60 tablet 1 02/08/2015 at 800  . traZODone (DESYREL) 150 MG tablet Take by mouth at bedtime.   02/08/2015    Assessment New onset atrial fibrillation. Chronic diastolic heart failure.  Labs reviewed PTA medications reviewed. Heparin level below goal  Goal of Therapy:  Heparin level 0.3-0.7 units/ml Monitor platelets by anticoagulation protocol: Yes   Plan:  Increase heparin rate to 1050 units/hour Unfractionated heparin level at 5 AM and daily Monitor CBC/platelets   Sierra Singleton Singleton, Sierra Singleton Singleton 02/11/2015,8:45 PM

## 2015-02-11 NOTE — Plan of Care (Signed)
Problem: ICU Phase Progression Outcomes Goal: Flu/PneumoVaccines if indicated Outcome: Not Applicable Date Met:  22/48/25 Pt. Refused flu and pneumonia vaccine

## 2015-02-11 NOTE — Progress Notes (Signed)
ANTICOAGULATION CONSULT NOTE - Initial Consult  Pharmacy Consult for Heparin Indication: atrial fibrillation  Allergies  Allergen Reactions  . Ambien [Zolpidem Tartrate]   . Sulfa Antibiotics Nausea And Vomiting  . Clindamycin/Lincomycin Swelling and Rash    Patient Measurements: Height: 5\' 2"  (157.5 cm) Weight: 166 lb 0.1 oz (75.3 kg) IBW/kg (Calculated) : 50.1 Heparin Dosing Weight: 66.4 kg  Vital Signs: Temp: 98.4 F (36.9 C) (03/19 0800) Temp Source: Oral (03/19 0800) BP: 148/84 mmHg (03/19 0600) Pulse Rate: 122 (03/19 0600)  Labs:  Recent Labs  02/09/15 1030 02/09/15 2029 02/10/15 0246 02/10/15 0508 02/10/15 0854 02/10/15 1301 02/11/15 0026 02/11/15 0547  HGB 10.5*  --   --  9.6*  --   --   --  9.9*  HCT 33.8*  --   --  30.1*  --   --   --  31.2*  PLT 198  --   --  189  --   --   --  174  CREATININE 1.40*  --   --  1.40*  --  1.64* 1.43* 1.41*  TROPONINI <0.03 0.59* 0.59*  --  0.68*  --   --   --     Estimated Creatinine Clearance: 30.7 mL/min (by C-G formula based on Cr of 1.41).   Medical History: Past Medical History  Diagnosis Date  . Rash     Zebedee IbaHaley Haley rash  . Anxiety   . Thyroid disease   . Hyperlipidemia   . Hypertension   . COPD (chronic obstructive pulmonary disease)   . Poor historian   . Carotid artery disease   . Atrial fibrillation 02/11/2015  . Chronic respiratory failure with hypoxia 02/11/2015    Medications:  Prescriptions prior to admission  Medication Sig Dispense Refill Last Dose  . atorvastatin (LIPITOR) 20 MG tablet Take 20 mg by mouth daily.   02/08/2015  . citalopram (CELEXA) 40 MG tablet Take 40 mg by mouth daily.   02/08/2015  . clopidogrel (PLAVIX) 75 MG tablet Take 75 mg by mouth daily.   02/08/2015  . folic acid (FOLVITE) 1 MG tablet Take 1 mg by mouth daily.   02/08/2015  . furosemide (LASIX) 40 MG tablet Take 1 tablet (40 mg total) by mouth daily. 30 tablet 1 02/08/2015  . HYDROcodone-acetaminophen (NORCO) 7.5-325  MG per tablet Take 1 tablet by mouth 3 (three) times daily as needed for moderate pain. 30 tablet 0 02/08/2015  . levothyroxine (SYNTHROID, LEVOTHROID) 100 MCG tablet Take 100 mcg by mouth daily before breakfast.   02/08/2015  . methotrexate 2.5 MG tablet Take 10 mg by mouth once a week. On Sunday.   02/05/2015  . metoprolol tartrate (LOPRESSOR) 25 MG tablet Take 1 tablet (25 mg total) by mouth 2 (two) times daily. 60 tablet 1 02/08/2015 at 800  . traZODone (DESYREL) 150 MG tablet Take by mouth at bedtime.   02/08/2015    Assessment New onset atrial fibrillation. Chronic diastolic heart failure.  Labs reviewed PTA medications reviewed.  Goal of Therapy:  Heparin level 0.3-0.7 units/ml Monitor platelets by anticoagulation protocol: Yes   Plan:  Give 4000 units bolus x 1 Start heparin infusion at 950 units/hr Check anti-Xa level in 8 hours and daily while on heparin Continue to monitor H&H and platelets  Raquel JamesPittman, Laureano Hetzer Bennett 02/11/2015,8:50 AM

## 2015-02-11 NOTE — Progress Notes (Signed)
CTSP re tachycardia. Her HR was 140-160,  BP 145/70. She was rather asymptomatic, having no chest tightness of SOB. She conversed full sentences, and carried meaningful conversation. She had slight "burning" on her chest. EKG showed Afib with RVR.  This may new new onset. She doesn't drink alcohol. Her CHADSVAS2=5. She was given 10 cardiazem IV x 2, and Lopressor 5mg  IV x 1, then a cardiazem drip. She is currently on Plavix, but no on DAPT (dual antiplatelet therapy) She had no contraindication to full anticoagulation, but I will defer to the attending physician.

## 2015-02-11 NOTE — Progress Notes (Addendum)
PROGRESS NOTE  Sierra KalesShelby J Singleton CWC:[id number] DOB: Sep 03, 1933 DOA: 02/09/2015 PCP: Sierra SpeckingVYAS,DHRUV B., MD  Summary: [age] PMH grade 2 diastolic dysfunction, oxygen-dependent COPD, presented with acute resp distress and was intubated immediately in ED. Admitted for acute resp failure, acute on chronic diastolic CHF, acute kidney injury  Assessment/Plan: 1. Acute hypoxic and hypercapnic respiratory failure, secondary to acute on chronic diastolic heart failure. Stable on [place] s/p extubation 3/18. 2. Acute on chronic diastolic CHF (grade 2). Clinically improving; CXR does not correlate with exam or clinical status. 3. Demand ischemia secondary to heart failure. Asymptomatic. Medical management per cardiology. Echocardiogram reassuring, LVEF 60-65%, normal wall mothion. 4. New onset atrial fibrillation with RVR. CHADS2 = 3, CHADS-Vasc =6. 5. Possible AKI appears resolved. CKD stage III, appears to be at baseline. 6. Macrocytic anemia, appears stable. 7. COPD. Stable.  8. Chronic hypoxic respiratory failure on home oxygen. Stable. 9. Hypothyroidism. TSH normal. 10. Depression. Celexa max dose 20mg /day >60yo.   Overall improving s/p extubation. Respiratory status stable. Diuresis and weight loss encouraging.  Replace potassium  BMP, troponin in AM  Continue diltiazem infusion, change to oral when rate controlled.  Discussed risk/benefit of anticoagulation with patient, will start heparin for afib. No known contraindication.  Unable to reach family. [phone] has been disconnected. No answer at [phone].  Code Status: full code DVT prophylaxis: heparin Family Communication: none present Disposition Plan: home  Sierra Sacksaniel Goodrich, MD  [facility]  Pager [phone] If 7PM-7AM, please contact night-coverage at [email or web address], password [email or web address] 02/11/2015, 7:55 AM  LOS: 2 days   Consultants:  Pulmonology   Cardiology   Procedures:  ETT 3/17 >> 3/18  Echocardiogram Study  Conclusions  - Left ventricle: The cavity size was normal. There was mild concentric hypertrophy. Systolic function was normal. The estimated ejection fraction was in the range of 60% to 65%. Wall motion was normal; there were no regional wall motion abnormalities. Features are consistent with a pseudonormal left ventricular filling pattern, with concomitant abnormal relaxation and increased filling pressure (grade 2 diastolic dysfunction). - Mitral valve: Mildly calcified annulus. There was mild to moderate regurgitation. - Left atrium: The atrium was moderately to severely dilated. - Right ventricle: The cavity size was mildly dilated. - Right atrium: The atrium was mildly to moderately dilated.  Impressions:  - Limited echocardiogram to assess left ventricular systolic function, which appears to be grossly normal.  Antibiotics:    HPI/Subjective: Cardiology increased Lasix yesterday, ordered limited echo, added beta-blocker.  Developed afib with RVR this AM.  Poor appetite but otherwise feels good. No CP, no SOB. Does not feel palpatations. No h/o afib, arrhythmia or bleeding known. No contraindication to anticoagulation known.  Objective: Filed Vitals:   02/11/15 0300 02/11/15 0400 02/11/15 0500 02/11/15 0600  BP: 129/80 142/38  148/84  Pulse: 89 92 101 122  Temp:   98.4 F (36.9 C)   TempSrc:   Oral   Resp: 20 24 26 20   Height:      Weight:   75.3 kg (166 lb 0.1 oz)   SpO2: 96% 96% 95% 97%    Intake/Output Summary (Last 24 hours) at 02/11/15 0755 Last data filed at 02/11/15 0500  Gross per 24 hour  Intake      0 ml  Output   1900 ml  Net  -1900 ml     Filed Weights   02/09/15 1615 02/10/15 0500 02/11/15 0500  Weight: 77.5 kg (170 lb 13.7 oz) 79.1 kg (174 lb 6.1 oz) 75.3 kg (166  lb 0.1 oz)    Exam:     Afebrile, tachycardic, hemodynamic stable. Oxygenation stable on 3 L nasal cannula. General: Appears calm and comfortable ENT:  grossly normal hearing, lips  Cardiovascular: tachycardic, irregular, no m/r/g. No LE edema. Telemetry: atrial fibrillation Respiratory: CTA bilaterally, no w/r/r. Normal respiratory effort. Speaks in full sentences. Musculoskeletal: grossly normal tone BUE/BLE Psychiatric: grossly normal mood and affect, speech fluent and appropriate Neurologic: grossly non-focal.  Data Reviewed:  UOP 1900; intake not recorded. weigth 174 >> 166  Creatinine stable 1.41; potassium 3.4  Troponin 0.03 >> 0.59 >> 0.59 >> 0.68  Hgb stable 9.9 (baseline)  CXR 3/17: CHF >> 3/18: CHF  EKG atrial fibrillation with RVR, VR 139; rate-related changes.  Scheduled Meds: . antiseptic oral rinse  7 mL Mouth Rinse BID  . atorvastatin  20 mg Oral Daily  . Chlorhexidine Gluconate Cloth  6 each Topical Q0600  . citalopram  40 mg Oral Daily  . clopidogrel  75 mg Oral Daily  . enoxaparin (LOVENOX) injection  30 mg Subcutaneous Q24H  . folic acid  1 mg Oral Daily  . furosemide  40 mg Intravenous TID  . ipratropium-albuterol  3 mL Nebulization QID  . levothyroxine  100 mcg Oral QAC breakfast  . mupirocin ointment   Nasal BID  . sodium chloride  3 mL Intravenous Q12H  . traZODone  150 mg Oral QHS   Continuous Infusions: . diltiazem (CARDIZEM) infusion 10 mg/hr (02/11/15 0721)    Principal Problem:   Acute respiratory failure with hypoxemia Active Problems:   Diastolic CHF, acute on chronic   Macrocytic anemia   ARF (acute renal failure)   Hypothyroidism   Atrial fibrillation   Demand ischemia   Chronic respiratory failure with hypoxia   Time spent 35 minutes

## 2015-02-12 LAB — CBC
HCT: 30.8 % — ABNORMAL LOW (ref 36.0–46.0)
Hemoglobin: 9.6 g/dL — ABNORMAL LOW (ref 12.0–15.0)
MCH: 31.6 pg (ref 26.0–34.0)
MCHC: 31.2 g/dL (ref 30.0–36.0)
MCV: 101.3 fL — AB (ref 78.0–100.0)
PLATELETS: 196 10*3/uL (ref 150–400)
RBC: 3.04 MIL/uL — AB (ref 3.87–5.11)
RDW: 14.5 % (ref 11.5–15.5)
WBC: 11.5 10*3/uL — ABNORMAL HIGH (ref 4.0–10.5)

## 2015-02-12 LAB — BASIC METABOLIC PANEL
ANION GAP: 10 (ref 5–15)
BUN: 33 mg/dL — ABNORMAL HIGH (ref 6–23)
CALCIUM: 8.3 mg/dL — AB (ref 8.4–10.5)
CO2: 28 mmol/L (ref 19–32)
Chloride: 97 mmol/L (ref 96–112)
Creatinine, Ser: 1.47 mg/dL — ABNORMAL HIGH (ref 0.50–1.10)
GFR, EST AFRICAN AMERICAN: 38 mL/min — AB (ref >90)
GFR, EST NON AFRICAN AMERICAN: 33 mL/min — AB (ref >90)
GLUCOSE: 111 mg/dL — AB (ref 70–99)
POTASSIUM: 3.9 mmol/L (ref 3.5–5.1)
Sodium: 135 mmol/L (ref 135–145)

## 2015-02-12 LAB — HEPARIN LEVEL (UNFRACTIONATED)
Heparin Unfractionated: 0.24 IU/mL — ABNORMAL LOW (ref 0.30–0.70)
Heparin Unfractionated: 0.27 IU/mL — ABNORMAL LOW (ref 0.30–0.70)
Heparin Unfractionated: 0.31 IU/mL (ref 0.30–0.70)

## 2015-02-12 LAB — PROTIME-INR
INR: 1.06 (ref 0.00–1.49)
PROTHROMBIN TIME: 13.9 s (ref 11.6–15.2)

## 2015-02-12 LAB — TROPONIN I: Troponin I: 0.3 ng/mL — ABNORMAL HIGH (ref <0.031)

## 2015-02-12 MED ORDER — DIPHENHYDRAMINE HCL 25 MG PO CAPS
25.0000 mg | ORAL_CAPSULE | Freq: Four times a day (QID) | ORAL | Status: DC | PRN
  Administered 2015-02-12: 25 mg via ORAL
  Filled 2015-02-12: qty 1

## 2015-02-12 MED ORDER — METHOTREXATE 2.5 MG PO TABS
10.0000 mg | ORAL_TABLET | ORAL | Status: DC
  Administered 2015-02-12 – 2015-02-19 (×2): 10 mg via ORAL
  Filled 2015-02-12 (×2): qty 4

## 2015-02-12 MED ORDER — FUROSEMIDE 40 MG PO TABS
40.0000 mg | ORAL_TABLET | Freq: Every day | ORAL | Status: DC
  Administered 2015-02-13 – 2015-02-15 (×3): 40 mg via ORAL
  Filled 2015-02-12 (×3): qty 1

## 2015-02-12 MED ORDER — LEVALBUTEROL HCL 0.63 MG/3ML IN NEBU
0.6300 mg | INHALATION_SOLUTION | Freq: Two times a day (BID) | RESPIRATORY_TRACT | Status: DC
  Administered 2015-02-12 (×2): 0.63 mg via RESPIRATORY_TRACT
  Filled 2015-02-12 (×2): qty 3

## 2015-02-12 MED ORDER — METOPROLOL TARTRATE 50 MG PO TABS
50.0000 mg | ORAL_TABLET | Freq: Two times a day (BID) | ORAL | Status: DC
  Administered 2015-02-12 – 2015-02-20 (×13): 50 mg via ORAL
  Filled 2015-02-12 (×16): qty 1

## 2015-02-12 MED ORDER — METOPROLOL TARTRATE 25 MG PO TABS
25.0000 mg | ORAL_TABLET | Freq: Two times a day (BID) | ORAL | Status: DC
  Administered 2015-02-12: 25 mg via ORAL
  Filled 2015-02-12: qty 1

## 2015-02-12 NOTE — Progress Notes (Signed)
ANTICOAGULATION CONSULT NOTE  Pharmacy Consult for Heparin Indication: atrial fibrillation  Allergies  Allergen Reactions  . Ambien [Zolpidem Tartrate]   . Sulfa Antibiotics Nausea And Vomiting  . Clindamycin/Lincomycin Swelling and Rash    Patient Measurements: Height:  (157.5 cm) Weight: 166 lb 3.6 oz (75.4 kg) IBW/kg (Calculated) : 50.1 Heparin Dosing Weight: 66.4 kg  Vital Signs: Temp: 100 F (37.8 C) (03/20 2000) Temp Source: Axillary (03/20 2000) BP: 135/88 mmHg (03/20 2000) Pulse Rate: 129 (03/20 2000)  Labs:  Recent Labs  02/10/15 0508 02/10/15 0854  02/11/15 0026 02/11/15 0547 02/11/15 0912  02/12/15 0449 02/12/15 1326 02/12/15 2011  HGB 9.6*  --   --   --  9.9*  --   --  9.6*  --   --   HCT 30.1*  --   --   --  31.2*  --   --  30.8*  --   --   PLT 189  --   --   --  174  --   --  196  --   --   LABPROT  --   --   --   --   --  15.6*  --  13.9  --   --   INR  --   --   --   --   --  1.23  --  1.06  --   --   HEPARINUNFRC  --   --   --   --   --   --   < > 0.24* 0.27* 0.31  CREATININE 1.40*  --   < > 1.43* 1.41*  --   --  1.47*  --   --   TROPONINI  --  0.68*  --   --   --  0.70*  --  0.30*  --   --   < > = values in this interval not displayed.  Estimated Creatinine Clearance: 29.5 mL/min (by C-G formula based on Cr of 1.47).   Medical History: Past Medical History  Diagnosis Date  . Rash     [name] rash  . Anxiety   . Thyroid disease   . Hyperlipidemia   . Hypertension   . COPD (chronic obstructive pulmonary disease)   . Poor historian   . Carotid artery disease   . Atrial fibrillation 02/11/2015  . Chronic respiratory failure with hypoxia 02/11/2015    Medications:  Prescriptions prior to admission  Medication Sig Dispense Refill Last Dose  . atorvastatin (LIPITOR) 20 MG tablet Take 20 mg by mouth daily.   02/08/2015  . citalopram (CELEXA) 40 MG tablet Take 40 mg by mouth daily.   02/08/2015  . clopidogrel (PLAVIX) 75 MG tablet  Take 75 mg by mouth daily.   02/08/2015  . folic acid (FOLVITE) 1 MG tablet Take 1 mg by mouth daily.   02/08/2015  . furosemide (LASIX) 40 MG tablet Take 1 tablet (40 mg total) by mouth daily. 30 tablet 1 02/08/2015  . HYDROcodone-acetaminophen (NORCO) 7.5-325 MG per tablet Take 1 tablet by mouth 3 (three) times daily as needed for moderate pain. 30 tablet 0 02/08/2015  . levothyroxine (SYNTHROID, LEVOTHROID) 100 MCG tablet Take 100 mcg by mouth daily before breakfast.   02/08/2015  . methotrexate 2.5 MG tablet Take 10 mg by mouth once a week. On Sunday.   02/05/2015  . metoprolol tartrate (LOPRESSOR) 25 MG tablet Take 1 tablet (25 mg total) by mouth 2 (two) times daily. 60  tablet 1 02/08/2015 at 800  . traZODone (DESYREL) 150 MG tablet Take by mouth at bedtime.   02/08/2015    Assessment New onset atrial fibrillation. Chronic diastolic heart failure.  Labs reviewed PTA medications reviewed. No bleeding noted Heparin level therapeutic  Goal of Therapy:  Heparin level 0.3-0.7 units/ml Monitor platelets by anticoagulation protocol: Yes   Plan:  Continue heparin rate at 1250 units/hour Unfractionated heparin level daily Monitor CBC/platelets   Brocha Gilliam Bennett 02/12/2015,8:48 PM

## 2015-02-12 NOTE — Progress Notes (Signed)
PROGRESS NOTE  Sierra Singleton:[id number] DOB: [birth year] DOA: 02/09/2015 PCP: Ignatius Specking., MD  Summary: 81yow PMH grade 2 diastolic dysfunction, oxygen-dependent COPD, presented with acute resp distress and was intubated immediately in ED. Admitted for acute resp failure, acute on chronic diastolic CHF, acute kidney injury. Condition rapidly improved and she was extubated within 24 hours and has remained stable in nasal cannula. Heart failure continues to clinically improve. Other issues include demand ischemia which was asymptomatic with reassuring LVEF and normal wall motion. Hospitalization complicated thus far by new-onset atrial fibrillation with difficult to control rate.  Assessment/Plan: 1. New onset atrial fibrillation with RVR. CHADS2 = 3, CHADS-Vasc =6. Currently on heparin, diltiazem infusion, start oral metoprolol. Asymptomatic. 2. Acute hypoxic and hypercapnic respiratory failure, secondary to acute on chronic diastolic heart failure. Resolved. S/p extubation 3/18. Stable on [place]. 3. Acute on chronic diastolic CHF (grade 2). Acute component resolved. Adequate diuresis. Weight stable.  4. Demand ischemia secondary to heart failure. Asymptomatic. Medical management per cardiology. Echocardiogram reassuring, LVEF 60-65%, normal wall motion. Troponin has trended down. 5. Possible AKI appears resolved. CKD stage III,  at baseline. 6. Macrocytic anemia, stable. 7. COPD. Remains stable.  8. Chronic hypoxic respiratory failure on home oxygen. Stable. 9. Hypothyroidism. TSH normal. 10. Depression. Celexa max dose /day >81yo.   Main issues is RVR despite /hr diltiazem. Add scheduled metoprolol. Hemodynamics stable. Continue heparin, when HR controlled change to NOAC.  Change to oral Lasix.  Once HR stable would anticipate discharge home.  No answer at [phone] last evening.  Code Status: full code DVT prophylaxis: heparin Family Communication: none present Disposition  Plan: home  Brendia Sacks, MD  [facility]  Pager [phone] If 7PM-7AM, please contact night-coverage at [email or web address], password [facility] 02/12/2015, 8:14 AM  LOS: 3 days   Consultants:  Pulmonology   Cardiology   Procedures:  ETT 3/17 >> 3/18  Echocardiogram Study Conclusions  - Left ventricle: The cavity size was normal. There was mild concentric hypertrophy. Systolic function was normal. The estimated ejection fraction was in the range of 60% to 65%. Wall motion was normal; there were no regional wall motion abnormalities. Features are consistent with a pseudonormal left ventricular filling pattern, with concomitant abnormal relaxation and increased filling pressure (grade 2 diastolic dysfunction). - Mitral valve: Mildly calcified annulus. There was mild to moderate regurgitation. - Left atrium: The atrium was moderately to severely dilated. - Right ventricle: The cavity size was mildly dilated. - Right atrium: The atrium was mildly to moderately dilated.  Impressions:  - Limited echocardiogram to assess left ventricular systolic function, which appears to be grossly normal.  Antibiotics:    HPI/Subjective: No issues overnight, eating better, HR still high, but better.   Objective: Filed Vitals:   02/12/15 0500 02/12/15 0654 02/12/15 0700 02/12/15 0715  BP: 124/53  132/65 117/68  Pulse: 73  62 124  Temp:      TempSrc:      Resp: Height:      Weight: 75.4 kg (166 lb 3.6 oz)     SpO2: 96% 97% 96% 95%    Intake/Output Summary (Last 24 hours) at 02/12/15 0814 Last data filed at 02/12/15 0500  Gross per 24 hour  Intake 460.75 ml  Output   1050 ml  Net -589.25 ml     Filed Weights   02/10/15 0500 02/11/15 0500 02/12/15 0500  Weight: 79.1 kg (174 lb 6.1 oz) 75.3 kg (166 lb 0.1 oz) 75.4 kg (  166 lb 3.6 oz)    Exam:     Remains afebrile, hemodynamics are stable. Oxygenation stable on oxygen. Heart rate remains labile  with tachycardia at times. General:  Appears comfortable, calm. Eating breakfast. Eyes: appear grossly normal ENT: grossly normal hearing Cardiovascular: tachycardic, irregular, no murmur, rub or gallop. No lower extremity edema. Telemetry: atrial fibrillation HR 110-130s Respiratory: Clear to auscultation bilaterally, no wheezes, rales or rhonchi. Normal respiratory effort. Musculoskeletal: grossly normal tone bilateral upper and lower extremities Psychiatric: grossly normal mood and affect, speech fluent and appropriate Neurologic: grossly non-focal.  Data Reviewed:  UOP 1300. -3.7 L since admission. Weight 174 >> 166 >> 166  Creatinine stable 1.47, BUN stable 3,; potassium 3.9  Troponin 0.03 >> 0.59 >> 0.59 >> 0.68 >> 0.3   Hgb stable 9.6 (baseline)  CXR 3/17: CHF >> 3/18: CHF  EKG atrial fibrillation with RVR, VR 139; rate-related changes.  Scheduled Meds: . antiseptic oral rinse  7 mL Mouth Rinse BID  . atorvastatin  20 mg Oral Daily  . Chlorhexidine Gluconate Cloth  6 each Topical Q0600  . citalopram  20 mg Oral Daily  . clopidogrel  75 mg Oral Daily  . folic acid  1 mg Oral Daily  . furosemide  40 mg Intravenous Q12H  . levalbuterol  0.63 mg Nebulization BID  . levothyroxine  100 mcg Oral QAC breakfast  . mupirocin ointment   Nasal BID  . sodium chloride  3 mL Intravenous Q12H  . traZODone  150 mg Oral QHS   Continuous Infusions: . diltiazem (CARDIZEM) infusion 15 mg/hr (02/12/15 0327)  . heparin 1,150 Units/hr (02/12/15 0801)    Principal Problem:   Acute respiratory failure with hypoxemia Active Problems:   Diastolic CHF, acute on chronic   Macrocytic anemia   ARF (acute renal failure)   Hypothyroidism   Atrial fibrillation   Demand ischemia   Chronic respiratory failure with hypoxia   Time spent 20 minutes

## 2015-02-12 NOTE — Progress Notes (Signed)
ANTICOAGULATION CONSULT NOTE  Pharmacy Consult for Heparin Indication: atrial fibrillation  Allergies  Allergen Reactions  . Ambien [Zolpidem Tartrate]   . Sulfa Antibiotics Nausea And Vomiting  . Clindamycin/Lincomycin Swelling and Rash    Patient Measurements: Height:  (157.5 cm) Weight: 166 lb 3.6 oz (75.4 kg) IBW/kg (Calculated) : 50.1 Heparin Dosing Weight: 66.4 kg  Vital Signs: Temp: 98.2 F (36.8 C) (03/20 0400) Temp Source: Oral (03/20 0400) BP: 101/43 mmHg (03/20 1315) Pulse Rate: 34 (03/20 1315)  Labs:  Recent Labs  02/10/15 0508 02/10/15 0854  02/11/15 0026 02/11/15 0547 02/11/15 0912 02/11/15 1923 02/12/15 0449 02/12/15 1326  HGB 9.6*  --   --   --  9.9*  --   --  9.6*  --   HCT 30.1*  --   --   --  31.2*  --   --  30.8*  --   PLT 189  --   --   --  174  --   --  196  --   LABPROT  --   --   --   --   --  15.6*  --  13.9  --   INR  --   --   --   --   --  1.23  --  1.06  --   HEPARINUNFRC  --   --   --   --   --   --  0.22* 0.24* 0.27*  CREATININE 1.40*  --   < > 1.43* 1.41*  --   --  1.47*  --   TROPONINI  --  0.68*  --   --   --  0.70*  --  0.30*  --   < > = values in this interval not displayed.  Estimated Creatinine Clearance: 29.5 mL/min (by C-G formula based on Cr of 1.47).   Medical History: Past Medical History  Diagnosis Date  . Rash     Sierra Singleton rash  . Anxiety   . Thyroid disease   . Hyperlipidemia   . Hypertension   . COPD (chronic obstructive pulmonary disease)   . Poor historian   . Carotid artery disease   . Atrial fibrillation 02/11/2015  . Chronic respiratory failure with hypoxia 02/11/2015    Medications:  Prescriptions prior to admission  Medication Sig Dispense Refill Last Dose  . atorvastatin (LIPITOR) 20 MG tablet Take 20 mg by mouth daily.   02/08/2015  . citalopram (CELEXA) 40 MG tablet Take 40 mg by mouth daily.   02/08/2015  . clopidogrel (PLAVIX) 75 MG tablet Take 75 mg by mouth daily.   02/08/2015  .  folic acid (FOLVITE) 1 MG tablet Take 1 mg by mouth daily.   02/08/2015  . furosemide (LASIX) 40 MG tablet Take 1 tablet (40 mg total) by mouth daily. 30 tablet 1 02/08/2015  . HYDROcodone-acetaminophen (NORCO) 7.5-325 MG per tablet Take 1 tablet by mouth 3 (three) times daily as needed for moderate pain. 30 tablet 0 02/08/2015  . levothyroxine (SYNTHROID, LEVOTHROID) 100 MCG tablet Take 100 mcg by mouth daily before breakfast.   02/08/2015  . methotrexate 2.5 MG tablet Take 10 mg by mouth once a week. On Sunday.   02/05/2015  . metoprolol tartrate (LOPRESSOR) 25 MG tablet Take 1 tablet (25 mg total) by mouth 2 (two) times daily. 60 tablet 1 02/08/2015 at 800  . traZODone (DESYREL) 150 MG tablet Take by mouth at bedtime.   02/08/2015    Assessment New  onset atrial fibrillation. Chronic diastolic heart failure.  Labs reviewed PTA medications reviewed. No bleeding noted Heparin level below goal after increasing rate  Goal of Therapy:  Heparin level 0.3-0.7 units/ml Monitor platelets by anticoagulation protocol: Yes   Plan:  Increase heparin rate to 1250 units/hour Unfractionated heparin level in 6 hours and daily Monitor CBC/platelets   Sierra Singleton 02/12/2015,1:48 PM

## 2015-02-12 NOTE — Progress Notes (Signed)
Decrease nebs to BID

## 2015-02-12 NOTE — Progress Notes (Signed)
ANTICOAGULATION CONSULT NOTE  Pharmacy Consult for Heparin Indication: atrial fibrillation  Allergies  Allergen Reactions  . Ambien [Zolpidem Tartrate]   . Sulfa Antibiotics Nausea And Vomiting  . Clindamycin/Lincomycin Swelling and Rash    Patient Measurements: Height:  (157.5 cm) Weight: 166 lb 3.6 oz (75.4 kg) IBW/kg (Calculated) : 50.1 Heparin Dosing Weight: 66.4 kg  Vital Signs: Temp: 98.2 F (36.8 C) (03/20 0400) Temp Source: Oral (03/20 0400) BP: 140/61 mmHg (03/20 0815) Pulse Rate: 128 (03/20 0815)  Labs:  Recent Labs  02/10/15 0508 02/10/15 0854  02/11/15 0026 02/11/15 0547 02/11/15 0912 02/11/15 1923 02/12/15 0449  HGB 9.6*  --   --   --  9.9*  --   --  9.6*  HCT 30.1*  --   --   --  31.2*  --   --  30.8*  PLT 189  --   --   --  174  --   --  196  LABPROT  --   --   --   --   --  15.6*  --  13.9  INR  --   --   --   --   --  1.23  --  1.06  HEPARINUNFRC  --   --   --   --   --   --  0.22* 0.24*  CREATININE 1.40*  --   < > 1.43* 1.41*  --   --  1.47*  TROPONINI  --  0.68*  --   --   --  0.70*  --  0.30*  < > = values in this interval not displayed.  Estimated Creatinine Clearance: 29.5 mL/min (by C-G formula based on Cr of 1.47).   Medical History: Past Medical History  Diagnosis Date  . Rash     Sierra Singleton rash  . Anxiety   . Thyroid disease   . Hyperlipidemia   . Hypertension   . COPD (chronic obstructive pulmonary disease)   . Poor historian   . Carotid artery disease   . Atrial fibrillation 02/11/2015  . Chronic respiratory failure with hypoxia 02/11/2015    Medications:  Prescriptions prior to admission  Medication Sig Dispense Refill Last Dose  . atorvastatin (LIPITOR) 20 MG tablet Take 20 mg by mouth daily.   02/08/2015  . citalopram (CELEXA) 40 MG tablet Take 40 mg by mouth daily.   02/08/2015  . clopidogrel (PLAVIX) 75 MG tablet Take 75 mg by mouth daily.   02/08/2015  . folic acid (FOLVITE) 1 MG tablet Take 1 mg by mouth  daily.   02/08/2015  . furosemide (LASIX) 40 MG tablet Take 1 tablet (40 mg total) by mouth daily. 30 tablet 1 02/08/2015  . HYDROcodone-acetaminophen (NORCO) 7.5-325 MG per tablet Take 1 tablet by mouth 3 (three) times daily as needed for moderate pain. 30 tablet 0 02/08/2015  . levothyroxine (SYNTHROID, LEVOTHROID) 100 MCG tablet Take 100 mcg by mouth daily before breakfast.   02/08/2015  . methotrexate 2.5 MG tablet Take 10 mg by mouth once a week. On Sunday.   02/05/2015  . metoprolol tartrate (LOPRESSOR) 25 MG tablet Take 1 tablet (25 mg total) by mouth 2 (two) times daily. 60 tablet 1 02/08/2015 at 800  . traZODone (DESYREL) 150 MG tablet Take by mouth at bedtime.   02/08/2015    Assessment New onset atrial fibrillation. Chronic diastolic heart failure.  Labs reviewed PTA medications reviewed. No bleeding noted Heparin level below goal after increasing rate  Goal of Therapy:  Heparin level 0.3-0.7 units/ml Monitor platelets by anticoagulation protocol: Yes   Plan:  Increase heparin rate to 1150 units/hour Unfractionated heparin level in 6 hours and daily Monitor CBC/platelets   Sierra Singleton 02/12/2015,8:56 AM

## 2015-02-13 DIAGNOSIS — I5033 Acute on chronic diastolic (congestive) heart failure: Secondary | ICD-10-CM | POA: Insufficient documentation

## 2015-02-13 DIAGNOSIS — I4891 Unspecified atrial fibrillation: Secondary | ICD-10-CM | POA: Insufficient documentation

## 2015-02-13 DIAGNOSIS — J9611 Chronic respiratory failure with hypoxia: Secondary | ICD-10-CM

## 2015-02-13 LAB — FOLATE RBC
Folate, Hemolysate: 505.6 ng/mL
Folate, RBC: 1532 ng/mL (ref >498)
Hematocrit: 33 % — ABNORMAL LOW (ref 34.0–46.6)

## 2015-02-13 LAB — CBC
HCT: 30.2 % — ABNORMAL LOW (ref 36.0–46.0)
Hemoglobin: 9.3 g/dL — ABNORMAL LOW (ref 12.0–15.0)
MCH: 31.3 pg (ref 26.0–34.0)
MCHC: 30.8 g/dL (ref 30.0–36.0)
MCV: 101.7 fL — AB (ref 78.0–100.0)
Platelets: 222 10*3/uL (ref 150–400)
RBC: 2.97 MIL/uL — ABNORMAL LOW (ref 3.87–5.11)
RDW: 14.5 % (ref 11.5–15.5)
WBC: 9.7 10*3/uL (ref 4.0–10.5)

## 2015-02-13 LAB — HEPARIN LEVEL (UNFRACTIONATED): Heparin Unfractionated: <0.1 IU/mL — ABNORMAL LOW (ref 0.30–0.70)

## 2015-02-13 MED ORDER — AMIODARONE IV BOLUS ONLY 150 MG/100ML
150.0000 mg | Freq: Once | INTRAVENOUS | Status: AC
  Administered 2015-02-13: 150 mg via INTRAVENOUS
  Filled 2015-02-13: qty 100

## 2015-02-13 MED ORDER — RIVAROXABAN 15 MG PO TABS
15.0000 mg | ORAL_TABLET | Freq: Every evening | ORAL | Status: DC
  Administered 2015-02-13 – 2015-02-19 (×7): 15 mg via ORAL
  Filled 2015-02-13 (×7): qty 1

## 2015-02-13 MED ORDER — DILTIAZEM HCL ER COATED BEADS 120 MG PO CP24
120.0000 mg | ORAL_CAPSULE | Freq: Two times a day (BID) | ORAL | Status: DC
  Administered 2015-02-13 – 2015-02-16 (×7): 120 mg via ORAL
  Filled 2015-02-13 (×7): qty 1

## 2015-02-13 MED ORDER — MAGIC MOUTHWASH: 5.0000 mL | Freq: Three times a day (TID) | ORAL | Status: DC | PRN

## 2015-02-13 MED ORDER — DIPHENHYDRAMINE HCL 25 MG PO CAPS
25.0000 mg | ORAL_CAPSULE | Freq: Four times a day (QID) | ORAL | Status: DC | PRN
  Administered 2015-02-13 – 2015-02-20 (×20): 25 mg via ORAL
  Filled 2015-02-13 (×23): qty 1

## 2015-02-13 NOTE — Progress Notes (Signed)
PROGRESS NOTE  LAELLE BRIDGETT ZOX:[id number] DOB: 1933/04/06 DOA: 02/09/2015 PCP: Ignatius Specking., MD  Summary: 81yow PMH grade 2 diastolic dysfunction, oxygen-dependent COPD, presented with acute resp distress and was intubated immediately in ED. Admitted for acute resp failure, acute on chronic diastolic CHF, acute kidney injury. Condition rapidly improved and she was extubated within 24 hours and has remained stable on nasal cannula. Heart failure has stabilized. Other issues included demand ischemia which was asymptomatic with reassuring LVEF and normal wall motion on echocardiogram. Hospitalization prolonged by new-onset atrial fibrillation with difficult to control rate which has prevented discharge. Currently on diltiazem infusion, or metoprolol. Cardiology following. Plan physical therapy evaluation, may need skilled rehabilitation stay prior to return home.  Assessment/Plan: 1. New onset atrial fibrillation with RVR. CHADS2 = 3, CHADS-Vasc =6. Currently on heparin, diltiazem infusion, oral metoprolol. Asymptomatic. 2. Acute hypoxic and hypercapnic respiratory failure, secondary to acute on chronic diastolic heart failure. Resolved. S/p extubation 3/18. Remained stable on [place]. 3. Acute on chronic diastolic CHF (grade 2). Acute component resolved. Adequate diuresis.  4. Demand ischemia secondary to heart failure. Medical management per cardiology. Echocardiogram reassuring, LVEF 60-65%, normal wall motion.  5. Possible AKI appears resolved. CKD stage III. 6. Macrocytic anemia, remains stable. 7. COPD. stable. 8. Chronic hypoxic respiratory failure on home oxygen. Remains stable. 9. Hypothyroidism. TSH normal. 10. Depression. Celexa max dose /day >81yo.   Main issue continues to be rapid ventricular response. Metoprolol was increased last evening, thus far no effect. Continues on diltiazem infusion. Awaiting further cardiology recommendations. Patient's asymptomatic and tolerated well.  Once her heart rate is controlled anticipate discharge.  Heart failure and respiratory issues appear to be stable.  Discussed in detail with the patient's daughter-in-law Myrene Buddy.  Code Status: full code DVT prophylaxis: heparin Family Communication: none present Disposition Plan: home  Brendia Sacks, MD  [facility]  Pager [phone] If 7PM-7AM, please contact night-coverage at [email or web address], password [facility] 02/13/2015, 8:40 AM  LOS: 4 days   Consultants:  Pulmonology   Cardiology   Procedures:  ETT 3/17 >> 3/18  Echocardiogram Study Conclusions  - Left ventricle: The cavity size was normal. There was mild concentric hypertrophy. Systolic function was normal. The estimated ejection fraction was in the range of 60% to 65%. Wall motion was normal; there were no regional wall motion abnormalities. Features are consistent with a pseudonormal left ventricular filling pattern, with concomitant abnormal relaxation and increased filling pressure (grade 2 diastolic dysfunction). - Mitral valve: Mildly calcified annulus. There was mild to moderate regurgitation. - Left atrium: The atrium was moderately to severely dilated. - Right ventricle: The cavity size was mildly dilated. - Right atrium: The atrium was mildly to moderately dilated.  Impressions:  - Limited echocardiogram to assess left ventricular systolic function, which appears to be grossly normal.  Antibiotics:    HPI/Subjective: Overall doing well, breathing fine. Still poor appetite.  Objective: Filed Vitals:   02/13/15 0600 02/13/15 0700 02/13/15 0800 02/13/15 0809  BP: 103/50 92/43 109/59   Pulse: 113 132 111   Temp:    98.3 F (36.8 C)  TempSrc:    Oral  Resp: Height:      Weight:      SpO2: 97% 97% 96%     Intake/Output Summary (Last 24 hours) at 02/13/15 0840 Last data filed at 02/13/15 0600  Gross per 24 hour  Intake 768.93 ml  Output   1125 ml    Net -356.07 ml  Filed Weights   02/11/15 0500 02/12/15 0500 02/13/15 0500  Weight: 75.3 kg (166 lb 0.1 oz) 75.4 kg (166 lb 3.6 oz) 79.5 kg (175 lb 4.3 oz)    Exam:    Afebrile, still tachycardic, stable oxygenation on 3 L General:  Appears calm and comfortable Cardiovascular: irregular, no m/r/g. No LE edema. Respiratory: CTA bilaterally, no w/r/r. Normal respiratory effort. Musculoskeletal: grossly normal tone BUE/BLE Psychiatric: grossly normal mood and affect, speech fluent and appropriate  Data Reviewed:  UOP 1125. -3.6 L since admission. Weights unreliable  Hgb stable 9.3 (baseline)  CXR 3/17: CHF >> 3/18: CHF  Scheduled Meds: . antiseptic oral rinse  7 mL Mouth Rinse BID  . atorvastatin  20 mg Oral Daily  . Chlorhexidine Gluconate Cloth  6 each Topical Q0600  . citalopram  20 mg Oral Daily  . clopidogrel  75 mg Oral Daily  . folic acid  1 mg Oral Daily  . furosemide  40 mg Oral Daily  . levothyroxine  100 mcg Oral QAC breakfast  . methotrexate  10 mg Oral Weekly  . metoprolol tartrate  50 mg Oral BID  . mupirocin ointment   Nasal BID  . sodium chloride  3 mL Intravenous Q12H  . traZODone  150 mg Oral QHS   Continuous Infusions: . diltiazem (CARDIZEM) infusion 2.5 mg/hr (02/13/15 0437)  . heparin 1,250 Units/hr (02/13/15 0445)    Principal Problem:   Acute respiratory failure with hypoxemia Active Problems:   Diastolic CHF, acute on chronic   Macrocytic anemia   ARF (acute renal failure)   Hypothyroidism   Atrial fibrillation   Demand ischemia   Chronic respiratory failure with hypoxia   Time spent 20 minutes

## 2015-02-13 NOTE — Progress Notes (Signed)
PT Cancellation Note  Patient Details Name: Sierra KalesShelby J Singleton MRN: [id number] DOB: 02/03/1933   Cancelled Treatment:    Reason Eval/Treat Not Completed: Fatigue/lethargy limiting ability to participate.  We will try again tomorrow to work with her.   Konrad PentaBrown, Jadeyn Hargett L 02/13/2015, 12:53 PM

## 2015-02-13 NOTE — Progress Notes (Signed)
Patient nebs are now prn

## 2015-02-13 NOTE — Progress Notes (Signed)
Consulting cardiologist: Marguarite ArbourKoneswran,Jazzmen Restivo MD Primary Cardiologist: Charlton HawsNishan, Peter MD  Cardiology Specific Problem List:  1.Atrial fibrillation 2. Diastolic CHF  Subjective:    Reviewed events over weekend. New onset atrial fibrillation on 02/11/2015, started on diltiazem gtt. CHADS VASC of 5.  She is asymptomatic.   Objective:   Temp:  [98 F (36.7 C)-100 F (37.8 C)] 98.3 F (36.8 C) (03/21 0809) Pulse Rate:  [25-138] 138 (03/21 1000) Resp:  [14-30] 27 (03/21 1000) BP: (75-151)/(26-97) 109/59 mmHg (03/21 0800) SpO2:  [82 %-100 %] 100 % (03/21 1000) Weight:  [175 lb 4.3 oz (79.5 kg)] 175 lb 4.3 oz (79.5 kg) (03/21 0500) Last BM Date: 02/09/15  Filed Weights   02/11/15 0500 02/12/15 0500 02/13/15 0500  Weight: 166 lb 0.1 oz (75.3 kg) 166 lb 3.6 oz (75.4 kg) 175 lb 4.3 oz (79.5 kg)    Intake/Output Summary (Last 24 hours) at 02/13/15 1016 Last data filed at 02/13/15 1000  Gross per 24 hour  Intake 1135.95 ml  Output   1125 ml  Net  10.95 ml    Telemetry: Atrial fib rates in 120's.   Exam:  General: No acute distress.  HEENT: Conjunctiva and lids normal, oropharynx clear.  Lungs: Some crackles in the bases.   Cardiac: No elevated JVP or bruits. IRRR, tachycardic, no gallop or rub.   Abdomen: Normoactive bowel sounds, nontender, nondistended.  Extremities: No pitting edema, distal pulses full.  Neuropsychiatric: Alert and oriented x3, less confused.    Lab Results:  Basic Metabolic Panel:  Recent Labs Lab 02/11/15 0026 02/11/15 0547 02/12/15 0449  NA 138 140 135  K 3.3* 3.4* 3.9  CL 99 99 97  CO2 30 30 28   GLUCOSE 130* 102* 111*  BUN 33* 33* 33*  CREATININE 1.43* 1.41* 1.47*  CALCIUM 8.2* 8.4 8.3*    Liver Function Tests:  Recent Labs Lab 02/09/15 1030  AST 27  ALT 14  ALKPHOS 71  BILITOT 0.7  PROT 7.1  ALBUMIN 3.4*    CBC:  Recent Labs Lab 02/11/15 0547 02/12/15 0449 02/13/15 0427  WBC 11.0* 11.5* 9.7  HGB 9.9* 9.6*  9.3*  HCT 31.2* 30.8* 30.2*  MCV 100.3* 101.3* 101.7*  PLT 174 196 222    Cardiac Enzymes:  Recent Labs Lab 02/10/15 0854 02/11/15 0912 02/12/15 0449  TROPONINI 0.68* 0.70* 0.30*    Coagulation:  Recent Labs Lab 02/11/15 0912 02/12/15 0449  INR 1.23 1.06   Echocardiogram 02/10/2015 Left ventricle: The cavity size was normal. There was mild concentric hypertrophy. Systolic function was normal. The estimated ejection fraction was in the range of 60% to 65%. Wall motion was normal; there were no regional wall motion abnormalities. Features are consistent with a pseudonormal left ventricular filling pattern, with concomitant abnormal relaxation and increased filling pressure (grade 2 diastolic dysfunction). - Mitral valve: Mildly calcified annulus. There was mild to moderate regurgitation. - Left atrium: The atrium was moderately to severely dilated. - Right ventricle: The cavity size was mildly dilated. - Right atrium: The atrium was mildly to moderately dilated.  Medications:   Scheduled Medications: . antiseptic oral rinse  7 mL Mouth Rinse BID  . atorvastatin  20 mg Oral Daily  . Chlorhexidine Gluconate Cloth  6 each Topical Q0600  . citalopram  20 mg Oral Daily  . clopidogrel  75 mg Oral Daily  . folic acid  1 mg Oral Daily  . furosemide  40 mg Oral Daily  . levothyroxine  100 mcg Oral  QAC breakfast  . methotrexate  10 mg Oral Weekly  . metoprolol tartrate  50 mg Oral BID  . mupirocin ointment   Nasal BID  . sodium chloride  3 mL Intravenous Q12H  . traZODone  150 mg Oral QHS    Infusions: . diltiazem (CARDIZEM) infusion 2.5 mg/hr (02/13/15 0437)  . heparin 1,250 Units/hr (02/13/15 0445)    PRN Medications: sodium chloride, albuterol, docusate, HYDROcodone-acetaminophen, magic mouthwash, ondansetron **OR** ondansetron (ZOFRAN) IV, sodium chloride   Assessment and Plan:   1. New Onset Atrial fib: Now on diltiazem gtt at 10 mg/hour and  heparin gtt. She was started on oral metoprolol 50 mg BID. She is asymptomatic. CHADS VASC Score of 5. Consider transition to oral diltiazem 60 mg Q 8 hours. HR is not well controlled currently. Will increase gtt ot 15 mg/hr.  Patient's annual stroke risk is 2.6%. Based up wt and creatinine clearance, Eliquis 5 mg BID is recommended.   2. Diastolic CHF: Wt is inaccurate, but she has diuresed 3.264 iters since admission. No over edema. . She is on lasix 40 mg daily. Continue low sodium diet.   3. NSTEMI: Likely from demand ischemia. No planned ischemic testing.   4. Hx of Carotid Disease: Per [place] in 2009. She is on Plavix Right ICA stenosis within the 50-69% range, closer to 69% because of the elevated left ICA velocity. This could be further evaluated with dedicated CTA or conventional angiography. Left ICA stenosis within the 0-50% range, status post left endarterectomy.   5. COPD: Per Dr. Coralee Rud. Lawrence NP [facility]  02/13/2015, 10:16 AM   The patient was seen and examined, and I agree with the assessment and plan as documented above, with modifications as noted below. Pt developed rapid atrial fibrillation (new-onset) over the weekend. Asymptomatic from this standpoint, and feeling much better when compared to Friday. On Lasix 40 mg daily for acute on chronic diastolic heart failure, now euvolemic. I will transition from diltiazem infusion to long-acting oral diltiazem 120 mg bid. She is on metoprolol as well. I will give one bolus of IV amiodarone 150 mg. Will d/c Plavix and heparin and start Xarelto 15 mg daily (due to GFR 34 ml/min). If I am unable to control HR, would consider TEE with cardioversion but will try medications first. As she has moderate to severe left atrial dilatation, it is unlikely she would be able to maintain normal sinus rhythm for longer periods of time. Thus, a rate control strategy with anticoagulation may be the best option.

## 2015-02-13 NOTE — Progress Notes (Signed)
Subjective: She says she feels better. She has a rash that she says is painful. Her breathing is pretty good. Her heart rate is still about 115 when she is talking  Objective: Vital signs in last 24 hours: Temp:  [98 F (36.7 C)-100 F (37.8 C)] 98.2 F (36.8 C) (03/21 0400) Pulse Rate:  [25-140] 113 (03/21 0600) Resp:  [14-30] 22 (03/21 0600) BP: (75-151)/(26-100) 86/61 mmHg (03/21 0545) SpO2:  [82 %-100 %] 97 % (03/21 0600) Weight:  [79.5 kg (175 lb 4.3 oz)] 79.5 kg (175 lb 4.3 oz) (03/21 0500) Weight change: 4.1 kg (9 lb 0.6 oz) Last BM Date: 02/09/15  Intake/Output from previous day: 03/20 0701 - 03/21 0700 In: 794.4 [P.O.:360; I.V.:434.4] Out: 1125 [Urine:1125]  PHYSICAL EXAM General appearance: alert, cooperative and mild distress Resp: clear to auscultation bilaterally Cardio: irregularly irregular rhythm GI: soft, non-tender; bowel sounds normal; no masses,  no organomegaly Extremities: extremities normal, atraumatic, no cyanosis or edema  Lab Results:  Results for orders placed or performed during the hospital encounter of 02/09/15 (from the past 48 hour(s))  Troponin I     Status: Abnormal   Collection Time: 02/11/15  9:12 AM  Result Value Ref Range   Troponin I 0.70 (HH) <0.031 ng/mL    Comment: CRITICAL RESULT CALLED TO, READ BACK BY AND VERIFIED WITH: MORTON N. AT 1002A ON [id number] BY THOMPSON S.        POSSIBLE MYOCARDIAL ISCHEMIA. SERIAL TESTING RECOMMENDED.   Protime-INR     Status: Abnormal   Collection Time: 02/11/15  9:12 AM  Result Value Ref Range   Prothrombin Time 15.6 (H) 11.6 - 15.2 seconds   INR 1.23 0.00 - 1.49  Heparin level (unfractionated)     Status: Abnormal   Collection Time: 02/11/15  7:23 PM  Result Value Ref Range   Heparin Unfractionated 0.22 (L) 0.30 - 0.70 IU/mL    Comment:        IF HEPARIN RESULTS ARE BELOW EXPECTED VALUES, AND PATIENT DOSAGE HAS BEEN CONFIRMED, SUGGEST FOLLOW UP TESTING OF ANTITHROMBIN III LEVELS.    Basic metabolic panel     Status: Abnormal   Collection Time: 02/12/15  4:49 AM  Result Value Ref Range   Sodium 135 135 - 145 mmol/L   Potassium 3.9 3.5 - 5.1 mmol/L   Chloride 97 96 - 112 mmol/L   CO2 28 19 - 32 mmol/L   Glucose, Bld 111 (H) 70 - 99 mg/dL   BUN 33 (H) 6 - 23 mg/dL   Creatinine, Ser 1.47 (H) 0.50 - 1.10 mg/dL   Calcium 8.3 (L) 8.4 - 10.5 mg/dL   GFR calc non Af Amer 33 (L) >90 mL/min   GFR calc Af Amer 38 (L) >90 mL/min    Comment: (NOTE) The eGFR has been calculated using the CKD EPI equation. This calculation has not been validated in all clinical situations. eGFR's persistently <90 mL/min signify possible Chronic Kidney Disease.    Anion gap 10 5 - 15  CBC     Status: Abnormal   Collection Time: 02/12/15  4:49 AM  Result Value Ref Range   WBC 11.5 (H) 4.0 - 10.5 K/uL   RBC 3.04 (L) 3.87 - 5.11 MIL/uL   Hemoglobin 9.6 (L) 12.0 - 15.0 g/dL   HCT 30.8 (L) 36.0 - 46.0 %   MCV 101.3 (H) 78.0 - 100.0 fL   MCH 31.6 26.0 - 34.0 pg   MCHC 31.2 30.0 - 36.0 g/dL   RDW  14.5 11.5 - 15.5 %   Platelets 196 150 - 400 K/uL  Troponin I     Status: Abnormal   Collection Time: 02/12/15  4:49 AM  Result Value Ref Range   Troponin I 0.30 (H) <0.031 ng/mL    Comment:        PERSISTENTLY INCREASED TROPONIN VALUES IN THE RANGE OF 0.04-0.49 ng/mL CAN BE SEEN IN:       -UNSTABLE ANGINA       -CONGESTIVE HEART FAILURE       -MYOCARDITIS       -CHEST TRAUMA       -ARRYHTHMIAS       -LATE PRESENTING MYOCARDIAL INFARCTION       -COPD   CLINICAL FOLLOW-UP RECOMMENDED.   Heparin level (unfractionated)     Status: Abnormal   Collection Time: 02/12/15  4:49 AM  Result Value Ref Range   Heparin Unfractionated 0.24 (L) 0.30 - 0.70 IU/mL    Comment:        IF HEPARIN RESULTS ARE BELOW EXPECTED VALUES, AND PATIENT DOSAGE HAS BEEN CONFIRMED, SUGGEST FOLLOW UP TESTING OF ANTITHROMBIN III LEVELS.   Protime-INR     Status: None   Collection Time: 02/12/15  4:49 AM  Result  Value Ref Range   Prothrombin Time 13.9 11.6 - 15.2 seconds   INR 1.06 0.00 - 1.49  Heparin level (unfractionated)     Status: Abnormal   Collection Time: 02/12/15  1:26 PM  Result Value Ref Range   Heparin Unfractionated 0.27 (L) 0.30 - 0.70 IU/mL    Comment:        IF HEPARIN RESULTS ARE BELOW EXPECTED VALUES, AND PATIENT DOSAGE HAS BEEN CONFIRMED, SUGGEST FOLLOW UP TESTING OF ANTITHROMBIN III LEVELS.   Heparin level (unfractionated)     Status: None   Collection Time: 02/12/15  8:11 PM  Result Value Ref Range   Heparin Unfractionated 0.31 0.30 - 0.70 IU/mL    Comment:        IF HEPARIN RESULTS ARE BELOW EXPECTED VALUES, AND PATIENT DOSAGE HAS BEEN CONFIRMED, SUGGEST FOLLOW UP TESTING OF ANTITHROMBIN III LEVELS.   CBC     Status: Abnormal   Collection Time: 02/13/15  4:27 AM  Result Value Ref Range   WBC 9.7 4.0 - 10.5 K/uL   RBC 2.97 (L) 3.87 - 5.11 MIL/uL   Hemoglobin 9.3 (L) 12.0 - 15.0 g/dL   HCT 30.2 (L) 36.0 - 46.0 %   MCV 101.7 (H) 78.0 - 100.0 fL   MCH 31.3 26.0 - 34.0 pg   MCHC 30.8 30.0 - 36.0 g/dL   RDW 14.5 11.5 - 15.5 %   Platelets 222 150 - 400 K/uL  Heparin level (unfractionated)     Status: Abnormal   Collection Time: 02/13/15  4:27 AM  Result Value Ref Range   Heparin Unfractionated <0.10 (L) 0.30 - 0.70 IU/mL    Comment:        IF HEPARIN RESULTS ARE BELOW EXPECTED VALUES, AND PATIENT DOSAGE HAS BEEN CONFIRMED, SUGGEST FOLLOW UP TESTING OF ANTITHROMBIN III LEVELS.     ABGS  Recent Labs  02/10/15 0845  PHART 7.437  PO2ART 74.7*  TCO2 21.0  HCO3 24.8*   CULTURES Recent Results (from the past 240 hour(s))  Urine culture     Status: None   Collection Time: 02/09/15 11:25 AM  Result Value Ref Range Status   Specimen Description URINE, CATHETERIZED  Final   Special Requests NONE  Final   Colony  Count NO GROWTH Performed at [facility]   Final   Culture NO GROWTH Performed at [facility]   Final   Report Status  02/11/2015 FINAL  Final  MRSA PCR Screening     Status: Abnormal   Collection Time: 02/09/15  2:40 PM  Result Value Ref Range Status   MRSA by PCR POSITIVE (A) NEGATIVE Final    Comment:        The GeneXpert MRSA Assay (FDA approved for NASAL specimens only), is one component of a comprehensive MRSA colonization surveillance program. It is not intended to diagnose MRSA infection nor to guide or monitor treatment for MRSA infections. RESULT CALLED TO, READ BACK BY AND VERIFIED WITH: COFFEE,J ON 02/09/15 $RemoveBe'@1635'[id number]$  BY MITCHELL,S    Studies/Results: No results found.  Medications:  Prior to Admission:  Prescriptions prior to admission  Medication Sig Dispense Refill Last Dose  . atorvastatin (LIPITOR) 20 MG tablet Take 20 mg by mouth daily.   02/08/2015  . citalopram (CELEXA) 40 MG tablet Take 40 mg by mouth daily.   02/08/2015  . clopidogrel (PLAVIX) 75 MG tablet Take 75 mg by mouth daily.   8/88/2800  . folic acid (FOLVITE) 1 MG tablet Take 1 mg by mouth daily.   02/08/2015  . furosemide (LASIX) 40 MG tablet Take 1 tablet (40 mg total) by mouth daily. 30 tablet 1 02/08/2015  . HYDROcodone-acetaminophen (NORCO) 7.5-325 MG per tablet Take 1 tablet by mouth 3 (three) times daily as needed for moderate pain. 30 tablet 0 02/08/2015  . levothyroxine (SYNTHROID, LEVOTHROID) 100 MCG tablet Take 100 mcg by mouth daily before breakfast.   02/08/2015  . methotrexate 2.5 MG tablet Take 10 mg by mouth once a week. On Sunday.   02/05/2015  . metoprolol tartrate (LOPRESSOR) 25 MG tablet Take 1 tablet (25 mg total) by mouth 2 (two) times daily. 60 tablet 1 02/08/2015 at 800  . traZODone (DESYREL) 150 MG tablet Take by mouth at bedtime.   02/08/2015   Scheduled: . antiseptic oral rinse  7 mL Mouth Rinse BID  . atorvastatin  20 mg Oral Daily  . Chlorhexidine Gluconate Cloth  6 each Topical Q0600  . citalopram  20 mg Oral Daily  . clopidogrel  75 mg Oral Daily  . folic acid  1 mg Oral Daily  . furosemide   40 mg Oral Daily  . levothyroxine  100 mcg Oral QAC breakfast  . methotrexate  10 mg Oral Weekly  . metoprolol tartrate  50 mg Oral BID  . mupirocin ointment   Nasal BID  . sodium chloride  3 mL Intravenous Q12H  . traZODone  150 mg Oral QHS   Continuous: . diltiazem (CARDIZEM) infusion 2.5 mg/hr (02/13/15 0437)  . heparin 1,250 Units/hr (02/13/15 0445)   LKJ:ZPHXTA chloride, albuterol, diphenhydrAMINE, docusate, HYDROcodone-acetaminophen, ondansetron **OR** ondansetron (ZOFRAN) IV, sodium chloride  Assesment: She was admitted with acute hypoxic respiratory failure requiring intubation and mechanical ventilation. This seems to be a combination of acute on chronic diastolic heart failure plus COPD. She was extubated after 24 hours and is doing well. Her major issue now is that she has atrial fibrillation which is been hard to control as far as her heart rate is concerned Principal Problem:   Acute respiratory failure with hypoxemia Active Problems:   Diastolic CHF, acute on chronic   Macrocytic anemia   ARF (acute renal failure)   Hypothyroidism   Atrial fibrillation   Demand ischemia   Chronic respiratory  failure with hypoxia    Plan: Continue current treatments    LOS: 4 days   Tarren Velardi L 02/13/2015, 7:25 AM

## 2015-02-13 NOTE — Progress Notes (Signed)
ANTICOAGULATION CONSULT NOTE  Pharmacy Consult for Heparin Indication: atrial fibrillation  Allergies  Allergen Reactions  . Ambien [Zolpidem Tartrate]   . Sulfa Antibiotics Nausea And Vomiting  . Clindamycin/Lincomycin Swelling and Rash   Patient Measurements: Height: 5\' 2"  (157.5 cm) Weight: 175 lb 4.3 oz (79.5 kg) IBW/kg (Calculated) : 50.1 Heparin Dosing Weight: 66.4 kg  Vital Signs: Temp: 98.3 F (36.8 C) (03/21 0809) Temp Source: Oral (03/21 0809) BP: 109/59 mmHg (03/21 0800) Pulse Rate: 111 (03/21 0800)  Labs:  Recent Labs  02/10/15 0854  02/11/15 0026  02/11/15 0547 02/11/15 0912  02/12/15 0449 02/12/15 1326 02/12/15 2011 02/13/15 0427  HGB  --   --   --   < > 9.9*  --   --  9.6*  --   --  9.3*  HCT  --   --   --   --  31.2*  --   --  30.8*  --   --  30.2*  PLT  --   --   --   --  174  --   --  196  --   --  222  LABPROT  --   --   --   --   --  15.6*  --  13.9  --   --   --   INR  --   --   --   --   --  1.23  --  1.06  --   --   --   HEPARINUNFRC  --   --   --   --   --   --   < > 0.24* 0.27* 0.31 <0.10*  CREATININE  --   < > 1.43*  --  1.41*  --   --  1.47*  --   --   --   TROPONINI 0.68*  --   --   --   --  0.70*  --  0.30*  --   --   --   < > = values in this interval not displayed.  Estimated Creatinine Clearance: 30.3 mL/min (by C-G formula based on Cr of 1.47).  Medical History: Past Medical History  Diagnosis Date  . Rash     Zebedee IbaHaley Haley rash  . Anxiety   . Thyroid disease   . Hyperlipidemia   . Hypertension   . COPD (chronic obstructive pulmonary disease)   . Poor historian   . Carotid artery disease   . Atrial fibrillation 02/11/2015  . Chronic respiratory failure with hypoxia 02/11/2015   Medications:  Prescriptions prior to admission  Medication Sig Dispense Refill Last Dose  . atorvastatin (LIPITOR) 20 MG tablet Take 20 mg by mouth daily.   02/08/2015  . citalopram (CELEXA) 40 MG tablet Take 40 mg by mouth daily.   02/08/2015  .  clopidogrel (PLAVIX) 75 MG tablet Take 75 mg by mouth daily.   02/08/2015  . folic acid (FOLVITE) 1 MG tablet Take 1 mg by mouth daily.   02/08/2015  . furosemide (LASIX) 40 MG tablet Take 1 tablet (40 mg total) by mouth daily. 30 tablet 1 02/08/2015  . HYDROcodone-acetaminophen (NORCO) 7.5-325 MG per tablet Take 1 tablet by mouth 3 (three) times daily as needed for moderate pain. 30 tablet 0 02/08/2015  . levothyroxine (SYNTHROID, LEVOTHROID) 100 MCG tablet Take 100 mcg by mouth daily before breakfast.   02/08/2015  . methotrexate 2.5 MG tablet Take 10 mg by mouth once a week. On Sunday.  02/05/2015  . metoprolol tartrate (LOPRESSOR) 25 MG tablet Take 1 tablet (25 mg total) by mouth 2 (two) times daily. 60 tablet 1 02/08/2015 at 800  . traZODone (DESYREL) 150 MG tablet Take by mouth at bedtime.   02/08/2015   Assessment New onset atrial fibrillation. Chronic diastolic heart failure.  Labs reviewed PTA medications reviewed. No bleeding noted Heparin level below goal  Goal of Therapy:  Heparin level 0.3-0.7 units/ml w/in 24 hrs of initiation of heparin Monitor platelets by anticoagulation protocol: Yes   Plan:  Increase heparin rate to 1400 units/hour Unfractionated heparin level today at 4pm and daily Monitor CBC/platelets  Margo Aye, Keiandra Sullenger A 02/13/2015,8:28 AM

## 2015-02-13 NOTE — Clinical Social Work Placement (Signed)
Clinical Social Work Department BRIEF PSYCHOSOCIAL ASSESSMENT 02/13/2015  Patient:  Sierra Singleton, Sierra Singleton     Account Number:  [id number]     Admit date:  02/09/2015  Clinical Social Worker:  Legrand Como  Date/Time:  02/13/2015 12:25 PM  Referred by:  CSW  Date Referred:  02/13/2015 Referred for  SNF Placement   Other Referral:   Interview type:  Patient Other interview type:    PSYCHOSOCIAL DATA Living Status:   Admitted from facility:   Level of care:   Primary support name:  Sierra Singleton Primary support relationship to patient:  CHILD, ADULT Degree of support available:   Patient reports that both of her sons are supportive.    CURRENT CONCERNS Current Concerns  Post-Acute Placement   Other Concerns:    SOCIAL WORK ASSESSMENT / PLAN CSW met with patient who was alert and oriented.  Patient indicated that she lives alone in an apartment.  Patient stated that a baseline she walks unassisted and completes ADL's unassisted. Patient indicated that she previously had [facility] coming to her home, however that stopped a few weeks ago. She indicated that when she had home health they assisted her with bathing. Patient indicated that once home health stopped she "took a couple showers by myself." Patient stated that she feels "terrible" about having to have people care for her.  Patient indicated that "I'm use to depending on myself and no one else." CSW validated patient's feeling related to the acute loss of independence.  CSW discussed with patient that she would be evaluated by PT and that based on the recommendations from PT she may be recommended to go to a SNF.  Patient indicated that she went to a SNF in the past in [place], [place].  Patient indicated that she would be receptive if the recommendation for SNF was made as she knew it would help her get back to her baseline.  CSW provided a SNF list to patient. Patient indicated that she would review the list with  her son today and let CSW know her choices.   Assessment/plan status:  Information/Referral to [employer] Other assessment/ plan:   Information/referral to community resources:    PATIENT'S/FAMILY'S RESPONSE TO PLAN OF CARE: Patient agreeable to go to SNF if PT makes the recommendation. Patient is troubled by her recent loss of independence due to acute and chronic illness.     Ambrose Pancoast, [place]

## 2015-02-14 ENCOUNTER — Inpatient Hospital Stay (HOSPITAL_COMMUNITY): Payer: Medicare Other

## 2015-02-14 LAB — CBC
HCT: 31.8 % — ABNORMAL LOW (ref 36.0–46.0)
HEMOGLOBIN: 9.9 g/dL — AB (ref 12.0–15.0)
MCH: 31.1 pg (ref 26.0–34.0)
MCHC: 31.1 g/dL (ref 30.0–36.0)
MCV: 100 fL (ref 78.0–100.0)
Platelets: 275 10*3/uL (ref 150–400)
RBC: 3.18 MIL/uL — AB (ref 3.87–5.11)
RDW: 14.1 % (ref 11.5–15.5)
WBC: 7.2 10*3/uL (ref 4.0–10.5)

## 2015-02-14 LAB — BASIC METABOLIC PANEL
Anion gap: 10 (ref 5–15)
BUN: 51 mg/dL — ABNORMAL HIGH (ref 6–23)
CO2: 28 mmol/L (ref 19–32)
Calcium: 9 mg/dL (ref 8.4–10.5)
Chloride: 96 mmol/L (ref 96–112)
Creatinine, Ser: 1.92 mg/dL — ABNORMAL HIGH (ref 0.50–1.10)
GFR calc Af Amer: 27 mL/min — ABNORMAL LOW (ref >90)
GFR, EST NON AFRICAN AMERICAN: 24 mL/min — AB (ref >90)
Glucose, Bld: 113 mg/dL — ABNORMAL HIGH (ref 70–99)
Potassium: 3.7 mmol/L (ref 3.5–5.1)
Sodium: 134 mmol/L — ABNORMAL LOW (ref 135–145)

## 2015-02-14 MED ORDER — GUAIFENESIN ER 600 MG PO TB12
1200.0000 mg | ORAL_TABLET | Freq: Two times a day (BID) | ORAL | Status: DC
  Administered 2015-02-14 – 2015-02-20 (×13): 1200 mg via ORAL
  Filled 2015-02-14 (×13): qty 2

## 2015-02-14 MED ORDER — LEVALBUTEROL HCL 0.63 MG/3ML IN NEBU
0.6300 mg | INHALATION_SOLUTION | Freq: Four times a day (QID) | RESPIRATORY_TRACT | Status: DC
  Administered 2015-02-14 (×2): 0.63 mg via RESPIRATORY_TRACT
  Filled 2015-02-14 (×2): qty 3

## 2015-02-14 MED ORDER — LEVALBUTEROL HCL 0.63 MG/3ML IN NEBU
0.6300 mg | INHALATION_SOLUTION | Freq: Three times a day (TID) | RESPIRATORY_TRACT | Status: DC
  Administered 2015-02-15: 0.63 mg via RESPIRATORY_TRACT
  Filled 2015-02-14: qty 3

## 2015-02-14 NOTE — Progress Notes (Signed)
Patients BP is 117/56, HR 66. Patient getting metoprolol and cardizem, paging on-call MD to make them aware. Will follow any new orders given and continue to monitor patient.

## 2015-02-14 NOTE — Evaluation (Signed)
Physical Therapy Evaluation Patient Details Name: Sierra KalesShelby J Disney MRN: [id number] DOB: 1933-07-29 Today's Date: 02/14/2015   History of Present Illness  Patient is a 81 year old woman with history significant for hypothyroidism, chronic diastolic heart failure with recent echo in February 2016 demonstrating an ejection fraction of 60-65% and grade 2 diastolic dysfunction and COPD who presented to the hospital with shortness of breath. However by the time I am seeing her she is intubated and hence am unable to obtain any history. She was so hypoxemic that the emergency department physician did not have time to obtain appropriate history either and proceeded straight to intubation. Workup in the emergency department shows a chest x-ray significant for congestive heart failure with moderate to severe interstitial edema, acute renal failure with a creatinine of 1.4 that is slightly improved from discharge at 1.54 on February 7, BNP of 676, was 593 on February 4, first set of troponins is negative. Son, Gery PrayBarry is present at bedside and has been updated. Unfortunately he does not live with his mother and is unable to give me any history either. We have been asked to admit her for further evaluation and management.  Pt normally lives alone and is independent with ADLs, no assistive device.  Clinical Impression  Pt is seen for evaluation and is found to be significantly weak and deconditioned as compared to when last seen (a month ago).  She now needs assist for transfers in and out of bed and gait is very unstable.  She needed a walker to stabilize gait but refused to use one.  She did hold onto portable O2 tank but lost her balance repeatedly and needed to be stabilized by therapist.  I strongly recommend SNF for pt at d/c.  She is agreeable to this.  We will follow her during this hospital stay.    Follow Up Recommendations SNF    Equipment Recommendations  None recommended by PT    Recommendations for  Other Services   none    Precautions / Restrictions Precautions Precautions: Fall Restrictions Weight Bearing Restrictions: No      Mobility  Bed Mobility Overal bed mobility: Needs Assistance Bed Mobility: Supine to Sit     Supine to sit: Min assist        Transfers Overall transfer level: Needs assistance Equipment used: None Transfers: Sit to/from Stand Sit to Stand: Min guard         General transfer comment: transfer to stand is very labored  Ambulation/Gait Ambulation/Gait assistance: Mod assist Ambulation Distance (Feet): 60 Feet Assistive device: None (pt refused to use a walker-held onto O2 tank) Gait Pattern/deviations: Trunk flexed;Shuffle   Gait velocity interpretation: Below normal speed for age/gender General Gait Details: gait is very slow with frequent loss of balance, needing to be stabilized by therapist  Stairs            Wheelchair Mobility    Modified Rankin (Stroke Patients Only)       Balance Overall balance assessment: Needs assistance Sitting-balance support: No upper extremity supported;Feet supported Sitting balance-Leahy Scale: Normal     Standing balance support: No upper extremity supported;During functional activity Standing balance-Leahy Scale: Poor Standing balance comment: pt falls side to side or backwards due to severe deconditioning                             Pertinent Vitals/Pain Pain Assessment: No/denies pain    Home Living Family/patient expects to be  discharged to:: Skilled nursing facility                      Prior Function Level of Independence: Independent               Hand Dominance        Extremity/Trunk Assessment               Lower Extremity Assessment: Generalized weakness (deconditioned)      Cervical / Trunk Assessment: Kyphotic  Communication   Communication: No difficulties  Cognition Arousal/Alertness: Awake/alert Behavior During  Therapy: WFL for tasks assessed/performed Overall Cognitive Status: Within Functional Limits for tasks assessed                      General Comments      Exercises        Assessment/Plan    PT Assessment Patient needs continued PT services  PT Diagnosis Difficulty walking;Abnormality of gait;Generalized weakness   PT Problem List Decreased strength;Decreased activity tolerance;Decreased balance;Decreased mobility;Cardiopulmonary status limiting activity  PT Treatment Interventions Gait training;Functional mobility training;Therapeutic exercise   PT Goals (Current goals can be found in the Care Plan section) Acute Rehab PT Goals Patient Stated Goal: wants to return home PT Goal Formulation: With patient Time For Goal Achievement: 02/28/15 Potential to Achieve Goals: Good    Frequency Min 3X/week   Barriers to discharge Decreased caregiver support lives alone    Co-evaluation               End of Session Equipment Utilized During Treatment: Gait belt;Oxygen Activity Tolerance: Patient limited by fatigue Patient left: in bed;with call bell/phone within reach;with bed alarm set           Time: 4098-1191 PT Time Calculation (min) (ACUTE ONLY): 51 min   Charges:   PT Evaluation $Initial PT Evaluation Tier I: 1 Procedure PT Treatments $Gait Training: 8-22 mins   PT G Codes:        Konrad Penta 02/14/2015, 3:21 PM

## 2015-02-14 NOTE — Clinical Social Work Note (Signed)
CSW spoke with patient related to placement. Patient indicted that she had not had a chance to review the SNF list with her son, however [facility] in [facility]hab there before.   [facility].  Tretha SciaraHeather Akeya Ryther, [place] [phone]

## 2015-02-14 NOTE — Progress Notes (Signed)
PROGRESS NOTE  Sierra KalesShelby J Jake Singleton:[id number] DOB: May 15, 1933 DOA: 02/09/2015 PCP: Sierra Singleton,Sierra B., MD  Summary: [age] PMH grade 2 diastolic dysfunction, oxygen-dependent COPD, presented with acute resp distress and was intubated immediately in ED. Admitted for acute resp failure, acute on chronic diastolic CHF, acute kidney injury. Condition rapidly improved and she was extubated within 24 hours and has remained stable on nasal cannula. Heart failure has stabilized. Other issues included demand ischemia which was asymptomatic with reassuring LVEF and normal wall motion on echocardiogram. Hospitalization prolonged by new-onset atrial fibrillation with difficult to control rate which has prevented discharge. Currently on oral diltiazem and metoprolol. Cardiology following. Plan physical therapy evaluation, may need skilled rehabilitation stay prior to return home.  Assessment/Plan: 1. New onset atrial fibrillation with RVR. CHADS2 = 3, CHADS-Vasc =6. Seen by cardiology. Started on metoprolol and diltiazem. Converted to sinus rhythm yesterday. On anticoagulation with Xarelto. 2. Acute hypoxic and hypercapnic respiratory failure, secondary to acute on chronic diastolic heart failure. Resolved. S/p extubation 3/18. Remained stable on [place]. 3. Acute on chronic diastolic CHF (grade 2). Acute component resolved. Adequate diuresis. Cardiology following. 4. Demand ischemia secondary to heart failure. Medical management per cardiology. Echocardiogram reassuring, LVEF 60-65%, normal wall motion.  5. Possible AKI appears resolved. CKD stage III. 6. Macrocytic anemia, remains stable. 7. COPD. Stable. Started on bronchodilators. 8. Chronic hypoxic respiratory failure on home oxygen. Remains stable. 9. Hypothyroidism. TSH normal. 10. Depression. Celexa max dose 20mg /day >60yo. 11. Left hip pain. Check xray  Code Status: full code DVT prophylaxis: heparin Family Communication: none present, updated daughter in law  over the phone Disposition Plan: home, transfer telemetry  Sierra BlinksJehanzeb Akesha Uresti, MD  [facility]  Pager [phone] If 7PM-7AM, please contact night-coverage at [email or web address], password Sierra Singleton 02/14/2015, 8:46 AM  LOS: 5 days   Consultants:  Pulmonology   Cardiology   Procedures:  ETT 3/17 >> 3/18  Echocardiogram Study Conclusions  - Left ventricle: The cavity size was normal. There was mild concentric hypertrophy. Systolic function was normal. The estimated ejection fraction was in the range of 60% to 65%. Wall motion was normal; there were no regional wall motion abnormalities. Features are consistent with a pseudonormal left ventricular filling pattern, with concomitant abnormal relaxation and increased filling pressure (grade 2 diastolic dysfunction). - Mitral valve: Mildly calcified annulus. There was mild to moderate regurgitation. - Left atrium: The atrium was moderately to severely dilated. - Right ventricle: The cavity size was mildly dilated. - Right atrium: The atrium was mildly to moderately dilated.  Impressions:  - Limited echocardiogram to assess left ventricular systolic function, which appears to be grossly normal.  Antibiotics:    HPI/Subjective: Converted to sinus rhythm yesterday evening. Feels breathing is about the same. Complains of pain in left hip/gluteal region that began this morning. Did not report any trauma. Feels that it may be related to mattress.  Objective: Filed Vitals:   02/14/15 0300 02/14/15 0400 02/14/15 0500 02/14/15 0624  BP: 97/39  125/42 114/39  Pulse: 52  61 68  Temp:  98.1 F (36.7 C)    TempSrc:  Oral    Resp: 20  20 19   Height:      Weight:   76.9 kg (169 lb 8.5 oz)   SpO2: 99%  98% 100%    Intake/Output Summary (Last 24 hours) at 02/14/15 0846 Last data filed at 02/13/15 2320  Gross per 24 hour  Intake    881 ml  Output   1500 ml  Net   -  619 ml     Filed Weights   02/12/15 0500 02/13/15  0500 02/14/15 0500  Weight: 75.4 kg (166 lb 3.6 oz) 79.5 kg (175 lb 4.3 oz) 76.9 kg (169 lb 8.5 oz)    Exam:    General:  Appears calm and comfortable Cardiovascular: RRR, no m/r/g. No LE edema. Respiratory: crackles bilaterally. Normal respiratory effort. Musculoskeletal: grossly normal tone BUE/BLE, pain on palpation over left gluteal region without any visible bruising, erythema or skin break down. Will check xray of left hip Psychiatric: grossly normal mood and affect, speech fluent and appropriate    Scheduled Meds: . antiseptic oral rinse  7 mL Mouth Rinse BID  . atorvastatin  20 mg Oral Daily  . Chlorhexidine Gluconate Cloth  6 each Topical Q0600  . citalopram  20 mg Oral Daily  . diltiazem  120 mg Oral BID  . folic acid  1 mg Oral Daily  . furosemide  40 mg Oral Daily  . levothyroxine  100 mcg Oral QAC breakfast  . methotrexate  10 mg Oral Weekly  . metoprolol tartrate  50 mg Oral BID  . mupirocin ointment   Nasal BID  . rivaroxaban  15 mg Oral QPM  . sodium chloride  3 mL Intravenous Q12H  . traZODone  150 mg Oral QHS   Continuous Infusions:    Principal Problem:   Acute respiratory failure with hypoxemia Active Problems:   Diastolic CHF, acute on chronic   Macrocytic anemia   ARF (acute renal failure)   Hypothyroidism   Atrial fibrillation   Demand ischemia   Chronic respiratory failure with hypoxia   New onset atrial fibrillation   Acute on chronic diastolic congestive heart failure   Time spent 30 minutes

## 2015-02-14 NOTE — Progress Notes (Signed)
Subjective: She says she feels better. She has no new complaints. Her breathing is okay.  Objective: Vital signs in last 24 hours: Temp:  [98 F (36.7 C)-98.1 F (36.7 C)] 98.1 F (36.7 C) (03/22 1436) Pulse Rate:  [52-81] 77 (03/22 1436) Resp:  [12-24] 16 (03/22 1436) BP: (76-139)/(36-57) 116/52 mmHg (03/22 1436) SpO2:  [96 %-100 %] 96 % (03/22 1436) Weight:  [76.9 kg (169 lb 8.5 oz)] 76.9 kg (169 lb 8.5 oz) (03/22 0500) Weight change: -2.6 kg (-5 lb 11.7 oz) Last BM Date: 02/14/15  Intake/Output from previous day: 03/21 0701 - 03/22 0700 In: 1016 [P.O.:840; I.V.:176] Out: 1500 [Urine:1500]  PHYSICAL EXAM General appearance: alert, cooperative and no distress Resp: rhonchi bilaterally Cardio: regular rate and rhythm, S1, S2 normal, no murmur, click, rub or gallop GI: soft, non-tender; bowel sounds normal; no masses,  no organomegaly Extremities: extremities normal, atraumatic, no cyanosis or edema  Lab Results:  Results for orders placed or performed during the hospital encounter of 02/09/15 (from the past 48 hour(s))  Heparin level (unfractionated)     Status: None   Collection Time: 02/12/15  8:11 PM  Result Value Ref Range   Heparin Unfractionated 0.31 0.30 - 0.70 IU/mL    Comment:        IF HEPARIN RESULTS ARE BELOW EXPECTED VALUES, AND PATIENT DOSAGE HAS BEEN CONFIRMED, SUGGEST FOLLOW UP TESTING OF ANTITHROMBIN III LEVELS.   CBC     Status: Abnormal   Collection Time: 02/13/15  4:27 AM  Result Value Ref Range   WBC 9.7 4.0 - 10.5 K/uL   RBC 2.97 (L) 3.87 - 5.11 MIL/uL   Hemoglobin 9.3 (L) 12.0 - 15.0 g/dL   HCT 30.2 (L) 36.0 - 46.0 %   MCV 101.7 (H) 78.0 - 100.0 fL   MCH 31.3 26.0 - 34.0 pg   MCHC 30.8 30.0 - 36.0 g/dL   RDW 14.5 11.5 - 15.5 %   Platelets 222 150 - 400 K/uL  Heparin level (unfractionated)     Status: Abnormal   Collection Time: 02/13/15  4:27 AM  Result Value Ref Range   Heparin Unfractionated <0.10 (L) 0.30 - 0.70 IU/mL    Comment:         IF HEPARIN RESULTS ARE BELOW EXPECTED VALUES, AND PATIENT DOSAGE HAS BEEN CONFIRMED, SUGGEST FOLLOW UP TESTING OF ANTITHROMBIN III LEVELS.   CBC     Status: Abnormal   Collection Time: 02/14/15 11:30 AM  Result Value Ref Range   WBC 7.2 4.0 - 10.5 K/uL   RBC 3.18 (L) 3.87 - 5.11 MIL/uL   Hemoglobin 9.9 (L) 12.0 - 15.0 g/dL   HCT 31.8 (L) 36.0 - 46.0 %   MCV 100.0 78.0 - 100.0 fL   MCH 31.1 26.0 - 34.0 pg   MCHC 31.1 30.0 - 36.0 g/dL   RDW 14.1 11.5 - 15.5 %   Platelets 275 150 - 400 K/uL  Basic metabolic panel     Status: Abnormal   Collection Time: 02/14/15 11:30 AM  Result Value Ref Range   Sodium 134 (L) 135 - 145 mmol/L   Potassium 3.7 3.5 - 5.1 mmol/L   Chloride 96 96 - 112 mmol/L   CO2 28 19 - 32 mmol/L   Glucose, Bld 113 (H) 70 - 99 mg/dL   BUN 51 (H) 6 - 23 mg/dL   Creatinine, Ser 1.92 (H) 0.50 - 1.10 mg/dL   Calcium 9.0 8.4 - 10.5 mg/dL   GFR calc non Af  Amer 24 (L) >90 mL/min   GFR calc Af Amer 27 (L) >90 mL/min    Comment: (NOTE) The eGFR has been calculated using the CKD EPI equation. This calculation has not been validated in all clinical situations. eGFR's persistently <90 mL/min signify possible Chronic Kidney Disease.    Anion gap 10 5 - 15    ABGS No results for input(s): PHART, PO2ART, TCO2, HCO3 in the last 72 hours.  Invalid input(s): PCO2 CULTURES Recent Results (from the past 240 hour(s))  Urine culture     Status: None   Collection Time: 02/09/15 11:25 AM  Result Value Ref Range Status   Specimen Description URINE, CATHETERIZED  Final   Special Requests NONE  Final   Colony Count NO GROWTH Performed at [facility]   Final   Culture NO GROWTH Performed at [facility]   Final   Report Status 02/11/2015 FINAL  Final  MRSA PCR Screening     Status: Abnormal   Collection Time: 02/09/15  2:40 PM  Result Value Ref Range Status   MRSA by PCR POSITIVE (A) NEGATIVE Final    Comment:        The GeneXpert MRSA Assay  (FDA approved for NASAL specimens only), is one component of a comprehensive MRSA colonization surveillance program. It is not intended to diagnose MRSA infection nor to guide or monitor treatment for MRSA infections. RESULT CALLED TO, READ BACK BY AND VERIFIED WITH: COFFEE,J ON 02/09/15 $RemoveBe'@1635'SRbHeHhNC$  BY MITCHELL,S    Studies/Results: Dg Hip Unilat With Pelvis 2-3 Views Left  02/14/2015   CLINICAL DATA:  Left hip pain, no known injury, initial encounter  EXAM: LEFT HIP (WITH PELVIS) 2-3 VIEWS  COMPARISON:  None.  FINDINGS: No acute fracture or dislocation is noted. The pelvic ring is intact. No gross soft tissue abnormality is noted. Mild vascular calcifications are seen.  IMPRESSION: No acute abnormality noted.   Electronically Signed   By: Inez Catalina M.D.   On: 02/14/2015 10:34    Medications:  Prior to Admission:  Prescriptions prior to admission  Medication Sig Dispense Refill Last Dose  . atorvastatin (LIPITOR) 20 MG tablet Take 20 mg by mouth daily.   02/08/2015  . citalopram (CELEXA) 40 MG tablet Take 40 mg by mouth daily.   02/08/2015  . clopidogrel (PLAVIX) 75 MG tablet Take 75 mg by mouth daily.   02/01/4075  . folic acid (FOLVITE) 1 MG tablet Take 1 mg by mouth daily.   02/08/2015  . furosemide (LASIX) 40 MG tablet Take 1 tablet (40 mg total) by mouth daily. 30 tablet 1 02/08/2015  . HYDROcodone-acetaminophen (NORCO) 7.5-325 MG per tablet Take 1 tablet by mouth 3 (three) times daily as needed for moderate pain. 30 tablet 0 02/08/2015  . levothyroxine (SYNTHROID, LEVOTHROID) 100 MCG tablet Take 100 mcg by mouth daily before breakfast.   02/08/2015  . methotrexate 2.5 MG tablet Take 10 mg by mouth once a week. On Sunday.   02/05/2015  . metoprolol tartrate (LOPRESSOR) 25 MG tablet Take 1 tablet (25 mg total) by mouth 2 (two) times daily. 60 tablet 1 02/08/2015 at 800  . traZODone (DESYREL) 150 MG tablet Take by mouth at bedtime.   02/08/2015   Scheduled: . antiseptic oral rinse  7 mL Mouth  Rinse BID  . atorvastatin  20 mg Oral Daily  . Chlorhexidine Gluconate Cloth  6 each Topical Q0600  . citalopram  20 mg Oral Daily  . diltiazem  120 mg Oral  BID  . folic acid  1 mg Oral Daily  . furosemide  40 mg Oral Daily  . guaiFENesin  1,200 mg Oral BID  . levalbuterol  0.63 mg Nebulization Q6H  . levothyroxine  100 mcg Oral QAC breakfast  . methotrexate  10 mg Oral Weekly  . metoprolol tartrate  50 mg Oral BID  . mupirocin ointment   Nasal BID  . rivaroxaban  15 mg Oral QPM  . sodium chloride  3 mL Intravenous Q12H  . traZODone  150 mg Oral QHS   Continuous:  UPJ:SRPRXY chloride, albuterol, diphenhydrAMINE, docusate, HYDROcodone-acetaminophen, magic mouthwash, ondansetron **OR** ondansetron (ZOFRAN) IV, sodium chloride  Assesment: She was admitted with acute respiratory failure and has a significant history of COPD on home oxygen. The major reason for acute respiratory failure seems to have been acute on chronic diastolic heart failure. She has since developed new onset atrial fibrillation with rapid ventricular response. She is now in sinus rhythm. She is comfortable. Principal Problem:   Acute respiratory failure with hypoxemia Active Problems:   Diastolic CHF, acute on chronic   Macrocytic anemia   ARF (acute renal failure)   Hypothyroidism   Atrial fibrillation   Demand ischemia   Chronic respiratory failure with hypoxia   New onset atrial fibrillation   Acute on chronic diastolic congestive heart failure    Plan: Continue treatments. I will follow  more peripherally.    LOS: 5 days   Shirin Echeverry L 02/14/2015, 6:57 PM

## 2015-02-14 NOTE — Progress Notes (Signed)
PT TRANSFERRING TO ROOM 336. PT ALERT AND ORIENTED. O2 AT 3L/MIIN VIA [place]. O2 SAT 96%. DIMINISHED BREATH SOUNDS. HEART RATE 68 IN NSR,W/ OCCASSIONAL DROPS TO 57. RT FOREARM NSL PATENT. REMAINS ON CONTACT PRECAUTIONS FOR (+) MRSA. TRANSFER REPORT CALLED TO BECKY RN ON 300.

## 2015-02-14 NOTE — Progress Notes (Signed)
Consulting cardiologist: Prentice DockerKoneswaran, Jailyn Leeson MD Primary Cardiologist: Charlton HawsNishan, Peter MD  Cardiology Specific Problem List:  1. Atrial fibrillation 2. Diastolic CHF  Subjective:    No complaints. Sleepy.   Objective:   Temp:  [97.6 F (36.4 C)-98.1 F (36.7 C)] 98.1 F (36.7 C) (03/22 0400) Pulse Rate:  [25-138] 68 (03/22 0624) Resp:  [15-30] 19 (03/22 0624) BP: (76-135)/(39-112) 114/39 mmHg (03/22 0624) SpO2:  [87 %-100 %] 100 % (03/22 0624) Weight:  [169 lb 8.5 oz (76.9 kg)] 169 lb 8.5 oz (76.9 kg) (03/22 0500) Last BM Date: 02/14/15  Filed Weights   02/12/15 0500 02/13/15 0500 02/14/15 0500  Weight: 166 lb 3.6 oz (75.4 kg) 175 lb 4.3 oz (79.5 kg) 169 lb 8.5 oz (76.9 kg)    Intake/Output Summary (Last 24 hours) at 02/14/15 0810 Last data filed at 02/13/15 2320  Gross per 24 hour  Intake    881 ml  Output   1500 ml  Net   -619 ml    Telemetry: NSR (converted around 7pm last evening)  Exam:  General: No acute distress.  HEENT: Conjunctiva and lids normal, oropharynx clear.  Lungs: Clear to auscultation, nonlabored.  Cardiac: No elevated JVP or bruits. RRR, occasional extra systole.no gallop or rub.   Abdomen: Normoactive bowel sounds, nontender, nondistended.  Extremities: No pitting edema, distal pulses full.  Neuropsychiatric: Alert and oriented x3, affect appropriate.   Lab Results:  Basic Metabolic Panel:  Recent Labs Lab 02/11/15 0026 02/11/15 0547 02/12/15 0449  NA 138 140 135  K 3.3* 3.4* 3.9  CL 99 99 97  CO2 30 30 28   GLUCOSE 130* 102* 111*  BUN 33* 33* 33*  CREATININE 1.43* 1.41* 1.47*  CALCIUM 8.2* 8.4 8.3*    Liver Function Tests:  Recent Labs Lab 02/09/15 1030  AST 27  ALT 14  ALKPHOS 71  BILITOT 0.7  PROT 7.1  ALBUMIN 3.4*    CBC:  Recent Labs Lab 02/11/15 0547 02/12/15 0449 02/13/15 0427  WBC 11.0* 11.5* 9.7  HGB 9.9* 9.6* 9.3*  HCT 31.2* 30.8* 30.2*  MCV 100.3* 101.3* 101.7*  PLT 174 196 222     Cardiac Enzymes:  Recent Labs Lab 02/10/15 0854 02/11/15 0912 02/12/15 0449  TROPONINI 0.68* 0.70* 0.30*    Coagulation:  Recent Labs Lab 02/11/15 0912 02/12/15 0449  INR 1.23 1.06    ECG: NSR with PAC's   Medications:   Scheduled Medications: . antiseptic oral rinse  7 mL Mouth Rinse BID  . atorvastatin  20 mg Oral Daily  . Chlorhexidine Gluconate Cloth  6 each Topical Q0600  . citalopram  20 mg Oral Daily  . diltiazem  120 mg Oral BID  . folic acid  1 mg Oral Daily  . furosemide  40 mg Oral Daily  . levothyroxine  100 mcg Oral QAC breakfast  . methotrexate  10 mg Oral Weekly  . metoprolol tartrate  50 mg Oral BID  . mupirocin ointment   Nasal BID  . rivaroxaban  15 mg Oral QPM  . sodium chloride  3 mL Intravenous Q12H  . traZODone  150 mg Oral QHS      PRN Medications: sodium chloride, albuterol, diphenhydrAMINE, docusate, HYDROcodone-acetaminophen, magic mouthwash, ondansetron **OR** ondansetron (ZOFRAN) IV, sodium chloride   Assessment and Plan:   1. Atrial fibrillation: Converted to NSR with occasional PAC's last evening on po diltiazem 120 mg BID and metoprolol with one dose of IV amiodarone 150 mg yesterday. Continues on Xarelto 15  mg daily. Recommend transfer to floor with PT to increase activity and physical conditioning.   2. Chronic Diastolic CHF: Has diuresed 4.1 liters since admission. Now on lasix 40 mg po daily. Creatinine     Bettey Mare. Lawrence NP [facility]  02/14/2015, 8:10 AM   The patient was seen and examined, and I agree with the assessment and plan as documented above, with modifications as noted below. Pt successfully converted to normal sinus rhythm with IV amiodarone bolus and long-acting diltiazem. Will continue diltiazem, metoprolol, and renally-dosed Xarelto 15 mg daily. Continue Lasix 40 mg daily. She lives alone and prefers short term rehab in [place], as this is where her family lives. Would recommend this prior to her  going back home. Should transfer to 3rd floor today.

## 2015-02-14 NOTE — Discharge Instructions (Signed)

## 2015-02-15 LAB — BASIC METABOLIC PANEL
Anion gap: 10 (ref 5–15)
BUN: 57 mg/dL — ABNORMAL HIGH (ref 6–23)
CO2: 27 mmol/L (ref 19–32)
CREATININE: 2.06 mg/dL — AB (ref 0.50–1.10)
Calcium: 8.7 mg/dL (ref 8.4–10.5)
Chloride: 97 mmol/L (ref 96–112)
GFR calc Af Amer: 25 mL/min — ABNORMAL LOW (ref >90)
GFR calc non Af Amer: 22 mL/min — ABNORMAL LOW (ref >90)
Glucose, Bld: 97 mg/dL (ref 70–99)
Potassium: 3.8 mmol/L (ref 3.5–5.1)
SODIUM: 134 mmol/L — AB (ref 135–145)

## 2015-02-15 LAB — CBC
HCT: 27.2 % — ABNORMAL LOW (ref 36.0–46.0)
HEMOGLOBIN: 8.8 g/dL — AB (ref 12.0–15.0)
MCH: 32.1 pg (ref 26.0–34.0)
MCHC: 32.4 g/dL (ref 30.0–36.0)
MCV: 99.3 fL (ref 78.0–100.0)
PLATELETS: 198 10*3/uL (ref 150–400)
RBC: 2.74 MIL/uL — AB (ref 3.87–5.11)
RDW: 14.2 % (ref 11.5–15.5)
WBC: 7.2 10*3/uL (ref 4.0–10.5)

## 2015-02-15 MED ORDER — HYDROCODONE-ACETAMINOPHEN 7.5-325 MG PO TABS
1.0000 | ORAL_TABLET | Freq: Four times a day (QID) | ORAL | Status: DC | PRN
  Administered 2015-02-15 – 2015-02-20 (×14): 1 via ORAL
  Filled 2015-02-15 (×16): qty 1

## 2015-02-15 MED ORDER — LEVALBUTEROL HCL 0.63 MG/3ML IN NEBU: 0.6300 mg | INHALATION_SOLUTION | Freq: Four times a day (QID) | RESPIRATORY_TRACT | Status: DC | PRN

## 2015-02-15 NOTE — Progress Notes (Signed)
Subjective:  No complaints. Feeling better.  Objective:  Vital Signs in the last 24 hours: Temp:  [97.9 F (36.6 C)-98.7 F (37.1 C)] 97.9 F (36.6 C) (03/23 0654) Pulse Rate:  [63-81] 69 (03/23 0654) Resp:  [12-24] 20 (03/23 0654) BP: (116-145)/(37-56) 145/45 mmHg (03/23 0654) SpO2:  [94 %-99 %] 95 % (03/23 0711) Weight:  [166 lb 11.2 oz (75.615 kg)] 166 lb 11.2 oz (75.615 kg) (03/23 0654)  Intake/Output from previous day: 03/22 0701 - 03/23 0700 In: 483 [P.O.:480; I.V.:3] Out: 500 [Urine:500] Intake/Output from this shift:    Physical Exam: NECK: Without JVD, HJR, or bruit LUNGS: Decreased breath sounds with scattered crackles and rales HEART: Regular rate and rhythm, 2/6 systolic murmur LSB,no gallop, rub, bruit, thrill, or heave EXTREMITIES: Without cyanosis, clubbing, or edema   Lab Results:  Recent Labs  02/14/15 1130 02/15/15 0612  WBC 7.2 7.2  HGB 9.9* 8.8*  PLT 275 198    Recent Labs  02/14/15 1130 02/15/15 0612  NA 134* 134*  K 3.7 3.8  CL 96 97  CO2 28 27  GLUCOSE 113* 97  BUN 51* 57*  CREATININE 1.92* 2.06*   No results for input(s): TROPONINI in the last 72 hours.  Invalid input(s): CK, MB Hepatic Function Panel No results for input(s): PROT, ALBUMIN, AST, ALT, ALKPHOS, BILITOT, BILIDIR, IBILI in the last 72 hours. No results for input(s): CHOL in the last 72 hours. No results for input(s): PROTIME in the last 72 hours.    Cardiac Studies: Echocardiogram 02/10/2015 Left ventricle: The cavity size was normal. There was mild   concentric hypertrophy. Systolic function was normal. The   estimated ejection fraction was in the range of 60% to 65%. Wall   motion was normal; there were no regional wall motion   abnormalities. Features are consistent with a pseudonormal left   ventricular filling pattern, with concomitant abnormal relaxation   and increased filling pressure (grade 2 diastolic dysfunction). - Mitral valve: Mildly calcified  annulus. There was mild to   moderate regurgitation. - Left atrium: The atrium was moderately to severely dilated. - Right ventricle: The cavity size was mildly dilated. - Right atrium: The atrium was mildly to moderately dilated.  Assessment/Plan:  1. Atrial fibrillation: Converted to NSR with occasional PAC's last evening on po diltiazem 120 mg BID and metoprolol with one dose of IV amiodarone 150 mg yesterday. Continues on Xarelto 15 mg daily. Recommend transfer to floor with PT to increase activity and physical conditioning. Patient not on telemetry-will add.  2. Chronic Diastolic CHF: Has diuresed 4.1 liters since admission. Now on lasix 40 mg po daily. Creatinine 2.06. Negative 469 cc since changed to oral lasix.   LOS: 6 days    Sierra ReedyMichele Singleton 02/15/2015, 9:15 AM   The patient was seen and examined, and I agree with the assessment and plan as documented above, with modifications as noted below. Pt successfully converted to normal sinus rhythm with IV amiodarone bolus and long-acting diltiazem. Will continue diltiazem, metoprolol, and renally-dosed Xarelto 15 mg daily. Renal function has worsened. Would recommend holding Lasix on 3/24, and resuming 20 mg and 40 mg on alternate days with follow up BMET within next few days. She lives alone and [place], as [place] where her family lives. Would recommend this prior to her going back home. No further recommendations at this time.

## 2015-02-15 NOTE — Progress Notes (Signed)
PROGRESS NOTE  Wilnette KalesShelby J Ryland ZOX:[id number] DOB: 1933/09/18 DOA: 02/09/2015 PCP: Ignatius SpeckingVYAS,DHRUV B., MD  Summary: [age] PMH grade 2 diastolic dysfunction, oxygen-dependent COPD, presented with acute resp distress and was intubated immediately in ED. Admitted for acute resp failure, acute on chronic diastolic CHF, acute kidney injury. Condition rapidly improved and she was extubated within 24 hours and has remained stable on nasal cannula. Heart failure has stabilized. Other issues included demand ischemia which was asymptomatic with reassuring LVEF and normal wall motion on echocardiogram. Hospitalization prolonged by new-onset atrial fibrillation with difficult to control rate which has prevented discharge. Currently on oral diltiazem and metoprolol. Cardiology following. Plan to discharge to SNF.  Assessment/Plan: 1. New onset atrial fibrillation with RVR. CHADS2 = 3, CHADS-Vasc =6. Seen by cardiology. Started on metoprolol and diltiazem. Converted to sinus rhythm yesterday. On anticoagulation with Xarelto. 2. Acute hypoxic and hypercapnic respiratory failure, secondary to acute on chronic diastolic heart failure. Resolved. S/p extubation 3/18. Remained stable on [place]. 3. Acute on chronic diastolic CHF (grade 2). Acute component resolved. Adequate diuresis. Cardiology following. 4. Demand ischemia secondary to heart failure. Medical management per cardiology. Echocardiogram reassuring, LVEF 60-65%, normal wall motion.  5. AKI on CKD 3. Possibly related to diuresis. Will hold lasix for now, although she did receive her dose for today. Recheck renal function in the morning.  6. Macrocytic anemia, remains stable. 7. COPD. Stable. Started on bronchodilators. 8. Chronic hypoxic respiratory failure on home oxygen. Remains stable. 9. Hypothyroidism. TSH normal. 10. Depression. Celexa max dose 20mg /day >60yo. 11. Left hip pain. Xray is unremarkable. She feels it may be related to immobility.  Improving. 12. Anemia. No obvious signs of bleeding. Recheck in morning.  Code Status: full code DVT prophylaxis: xarelto Family Communication: none present Disposition Plan: d/c to SNF when renal function stable  Erick BlinksJehanzeb Memon, MD  [facility]  Pager [phone] If 7PM-7AM, please contact night-coverage at [email or web address], password [email or web address] 02/15/2015, 12:54 PM  LOS: 6 days   Consultants:  Pulmonology   Cardiology   Procedures:  ETT 3/17 >> 3/18  Echocardiogram Study Conclusions  - Left ventricle: The cavity size was normal. There was mild concentric hypertrophy. Systolic function was normal. The estimated ejection fraction was in the range of 60% to 65%. Wall motion was normal; there were no regional wall motion abnormalities. Features are consistent with a pseudonormal left ventricular filling pattern, with concomitant abnormal relaxation and increased filling pressure (grade 2 diastolic dysfunction). - Mitral valve: Mildly calcified annulus. There was mild to moderate regurgitation. - Left atrium: The atrium was moderately to severely dilated. - Right ventricle: The cavity size was mildly dilated. - Right atrium: The atrium was mildly to moderately dilated.  Impressions:  - Limited echocardiogram to assess left ventricular systolic function, which appears to be grossly normal.  Antibiotics:    HPI/Subjective: Feels that breathing is approaching baseline. No new complaints  Objective: Filed Vitals:   02/14/15 2036 02/14/15 2104 02/15/15 0654 02/15/15 0711  BP:  117/56 145/45   Pulse:  66 69   Temp:  98.7 F (37.1 C) 97.9 F (36.6 C)   TempSrc:  Oral Oral   Resp:  20 20   Height:      Weight:   75.615 kg (166 lb 11.2 oz)   SpO2: 96% 98% 94% 95%    Intake/Output Summary (Last 24 hours) at 02/15/15 1254 Last data filed at 02/14/15 2255  Gross per 24 hour  Intake    243 ml  Output  0 ml  Net    243 ml     Filed Weights    02/13/15 0500 02/14/15 0500 02/15/15 0654  Weight: 79.5 kg (175 lb 4.3 oz) 76.9 kg (169 lb 8.5 oz) 75.615 kg (166 lb 11.2 oz)    Exam:    General:  Appears calm and comfortable Cardiovascular: RRR, no m/r/g. No LE edema. Respiratory: scattered crackles bilaterally, no wheezing. Normal respiratory effort. Musculoskeletal: grossly normal tone BUE/BLE, pain on palpation over left gluteal region without any visible bruising, erythema or skin break down. Will check xray of left hip Psychiatric: grossly normal mood and affect, speech fluent and appropriate    Scheduled Meds: . antiseptic oral rinse  7 mL Mouth Rinse BID  . atorvastatin  20 mg Oral Daily  . Chlorhexidine Gluconate Cloth  6 each Topical Q0600  . citalopram  20 mg Oral Daily  . diltiazem  120 mg Oral BID  . folic acid  1 mg Oral Daily  . guaiFENesin  1,200 mg Oral BID  . levothyroxine  100 mcg Oral QAC breakfast  . methotrexate  10 mg Oral Weekly  . metoprolol tartrate  50 mg Oral BID  . mupirocin ointment   Nasal BID  . rivaroxaban  15 mg Oral QPM  . sodium chloride  3 mL Intravenous Q12H  . traZODone  150 mg Oral QHS   Continuous Infusions:    Principal Problem:   Acute respiratory failure with hypoxemia Active Problems:   Diastolic CHF, acute on chronic   Macrocytic anemia   ARF (acute renal failure)   Hypothyroidism   Atrial fibrillation   Demand ischemia   Chronic respiratory failure with hypoxia   New onset atrial fibrillation   Acute on chronic diastolic congestive heart failure   Time spent 30 minutes

## 2015-02-15 NOTE — Progress Notes (Signed)
Pt's BP 109/88 with a HR of 53. Paged Dr.Kirby who gave orders to hold night time metoprolol, but give the cardizem. I will administer and continue to monitor.

## 2015-02-15 NOTE — Clinical Social Work Placement (Signed)
Clinical Social Work Department CLINICAL SOCIAL WORK PLACEMENT NOTE 02/15/2015  Patient:  Sierra Singleton,Sierra Singleton  Account Number:  [id number] Admit date:  02/09/2015  Clinical Social Worker:  Tretha SciaraHEATHER Addilyne Backs, LCSW  Date/time:  02/15/2015 09:56 AM  Clinical Social Work is seeking post-discharge placement for this patient at the following level of care:   SKILLED NURSING   (*CSW will update this form in Epic as items are completed)   02/13/2015  Patient/family provided with [facility] Department of Clinical Social Work's list of facilities offering this level of care within the geographic area requested by the patient (or if unable, by the patient's family).  02/13/2015  Patient/family informed of their freedom to choose among providers that offer the needed level of care, that participate in Medicare, Medicaid or managed care program needed by the patient, have an available bed and are willing to accept the patient.  02/13/2015  Patient/family informed of [facility]' ownership interest in [facility], as well as of the fact that they are under no obligation to receive care at this facility.  PASARR submitted to EDS on 02/14/2015 PASARR number received on   FL2 transmitted to all facilities in geographic area requested by pt/family on  02/14/2015 FL2 transmitted to all facilities within larger geographic area on   Patient informed that his/her managed care company has contracts with or will negotiate with  certain facilities, including the following:     Patient/family informed of bed offers received:  02/15/2015 [facility] [place] Physician recommends and patient chooses bed at    Patient to be transferred to  on   Patient to be transferred to facility by  Patient and family notified of transfer on  Name of family member notified:    The following physician request were entered in Epic:   Additional Comments:  Tretha SciaraHeather Coulter Oldaker,  LCSW [phone]

## 2015-02-15 NOTE — Progress Notes (Signed)
PT Cancellation Note  Patient Details Name: Sierra KalesShelby J Singleton MRN: [id number] DOB: October 13, 1933   Cancelled Treatment:    Reason Eval/Treat Not Completed: Fatigue/lethargy limiting ability to participate   Konrad PentaBrown, Travis Purk L 02/15/2015, 1:42 PM

## 2015-02-15 NOTE — Clinical Social Work Placement (Signed)
Clinical Social Work Department CLINICAL SOCIAL WORK PLACEMENT NOTE 02/15/2015  Patient:  Sierra Singleton,Sierra Singleton  Account Number:  [id number] Admit date:  02/09/2015  Clinical Social Worker:  Tretha SciaraHEATHER Nicolai Labonte, LCSW  Date/time:  02/15/2015 09:56 AM  Clinical Social Work is seeking post-discharge placement for this patient at the following level of care:   SKILLED NURSING   (*CSW will update this form in Epic as items are completed)   02/13/2015  Patient/family provided with [facility] Department of Clinical Social Work's list of facilities offering this level of care within the geographic area requested by the patient (or if unable, by the patient's family).  02/13/2015  Patient/family informed of their freedom to choose among providers that offer the needed level of care, that participate in Medicare, Medicaid or managed care program needed by the patient, have an available bed and are willing to accept the patient.  02/13/2015  Patient/family informed of [facility]' ownership interest in [facility], as well as of the fact that they are under no obligation to receive care at this facility.  PASARR submitted to EDS on 02/14/2015 PASARR number received on   FL2 transmitted to all facilities in geographic area requested by pt/family on  02/14/2015 FL2 transmitted to all facilities within larger geographic area on   Patient informed that his/her managed care company has contracts with or will negotiate with  certain facilities, including the following:     Patient/family informed of bed offers received:   Patient chooses bed at  Physician recommends and patient chooses bed at    Patient to be transferred to  on   Patient to be transferred to facility by  Patient and family notified of transfer on  Name of family member notified:    The following physician request were entered in Epic:   Additional Comments:  Tretha SciaraHeather Takashi Korol, LCSW [phone]

## 2015-02-16 DIAGNOSIS — I482 Chronic atrial fibrillation: Secondary | ICD-10-CM

## 2015-02-16 LAB — CBC
HCT: 27 % — ABNORMAL LOW (ref 36.0–46.0)
Hemoglobin: 8.6 g/dL — ABNORMAL LOW (ref 12.0–15.0)
MCH: 31.6 pg (ref 26.0–34.0)
MCHC: 31.9 g/dL (ref 30.0–36.0)
MCV: 99.3 fL (ref 78.0–100.0)
PLATELETS: 216 10*3/uL (ref 150–400)
RBC: 2.72 MIL/uL — AB (ref 3.87–5.11)
RDW: 14.1 % (ref 11.5–15.5)
WBC: 6.7 10*3/uL (ref 4.0–10.5)

## 2015-02-16 LAB — BASIC METABOLIC PANEL
Anion gap: 7 (ref 5–15)
BUN: 62 mg/dL — AB (ref 6–23)
CO2: 28 mmol/L (ref 19–32)
Calcium: 8.8 mg/dL (ref 8.4–10.5)
Chloride: 99 mmol/L (ref 96–112)
Creatinine, Ser: 2.11 mg/dL — ABNORMAL HIGH (ref 0.50–1.10)
GFR, EST AFRICAN AMERICAN: 25 mL/min — AB (ref >90)
GFR, EST NON AFRICAN AMERICAN: 21 mL/min — AB (ref >90)
GLUCOSE: 93 mg/dL (ref 70–99)
Potassium: 3.9 mmol/L (ref 3.5–5.1)
Sodium: 134 mmol/L — ABNORMAL LOW (ref 135–145)

## 2015-02-16 MED ORDER — DILTIAZEM HCL ER COATED BEADS 180 MG PO CP24
180.0000 mg | ORAL_CAPSULE | Freq: Two times a day (BID) | ORAL | Status: DC
  Administered 2015-02-16: 180 mg via ORAL
  Filled 2015-02-16: qty 1

## 2015-02-16 NOTE — Progress Notes (Signed)
PROGRESS NOTE  Sierra KalesShelby J Singleton [id number] DOB: 10-18-1933 DOA: 02/09/2015 PCP: Ignatius SpeckingVYAS,DHRUV B., MD  Summary: [age] PMH grade 2 diastolic dysfunction, oxygen-dependent COPD, presented with acute resp distress and was intubated immediately in ED. Admitted for acute resp failure, acute on chronic diastolic CHF, acute kidney injury. Condition rapidly improved and she was extubated within 24 hours and has remained stable on nasal cannula. Heart failure has stabilized. Other issues included demand ischemia which was asymptomatic with reassuring LVEF and normal wall motion on echocardiogram. Hospitalization prolonged by new-onset atrial fibrillation with difficult to control rate which has prevented discharge. Currently on oral diltiazem and metoprolol. Cardiology following. Plan to discharge to SNF.  Assessment/Plan: 1. New onset atrial fibrillation with RVR. CHADS2 = 3, CHADS-Vasc =6. Seen by cardiology. Started on metoprolol and diltiazem. Converted to sinus rhythm. On anticoagulation with Xarelto. 2. Acute hypoxic and hypercapnic respiratory failure, secondary to acute on chronic diastolic heart failure. Resolved. S/p extubation 3/18. Remained stable on [place]. 3. Acute on chronic diastolic CHF (grade 2). Acute component resolved. Adequate diuresis. Cardiology following. 4. Demand ischemia secondary to heart failure. Medical management per cardiology. Echocardiogram reassuring, LVEF 60-65%, normal wall motion.  5. AKI on CKD 3. Possibly related to diuresis. Will hold lasix for now. Recheck renal function in the morning.  6. Macrocytic anemia, remains stable. 7. COPD. Stable. Started on bronchodilators. 8. Chronic hypoxic respiratory failure on home oxygen. Remains stable. 9. Hypothyroidism. TSH normal. 10. Depression. Celexa max dose 20mg /day >60yo. 11. Left hip pain. Xray is unremarkable. She feels it may be related to immobility. Improving. 12. Anemia. No obvious signs of bleeding. Hemoglobin  stable.  Code Status: full code DVT prophylaxis: xarelto Family Communication: none present Disposition Plan: d/c to SNF when renal function stable  Erick BlinksJehanzeb Memon, MD  [facility]  Pager [phone] If 7PM-7AM, please contact night-coverage at [email or web address], password [email or web address] 02/16/2015, 11:50 AM  LOS: 7 days   Consultants:  Pulmonology   Cardiology   Procedures:  ETT 3/17 >> 3/18  Echocardiogram Study Conclusions  - Left ventricle: The cavity size was normal. There was mild concentric hypertrophy. Systolic function was normal. The estimated ejection fraction was in the range of 60% to 65%. Wall motion was normal; there were no regional wall motion abnormalities. Features are consistent with a pseudonormal left ventricular filling pattern, with concomitant abnormal relaxation and increased filling pressure (grade 2 diastolic dysfunction). - Mitral valve: Mildly calcified annulus. There was mild to moderate regurgitation. - Left atrium: The atrium was moderately to severely dilated. - Right ventricle: The cavity size was mildly dilated. - Right atrium: The atrium was mildly to moderately dilated.  Impressions:  - Limited echocardiogram to assess left ventricular systolic function, which appears to be grossly normal.  Antibiotics:    HPI/Subjective: No shortness of breath or cough. Complaining of pain on rashes over back (chronic)  Objective: Filed Vitals:   02/15/15 0711 02/15/15 1851 02/15/15 2008 02/16/15 0642  BP:  115/41 109/88 130/45  Pulse:  68 56 66  Temp:  98 F (36.7 C) 98.3 F (36.8 C) 98.3 F (36.8 C)  TempSrc:  Oral Oral Oral  Resp:  20 20 20   Height:      Weight:    76.25 kg (168 lb 1.6 oz)  SpO2: 95% 98% 99% 97%    Intake/Output Summary (Last 24 hours) at 02/16/15 1150 Last data filed at 02/15/15 1851  Gross per 24 hour  Intake    480 ml  Output  0 ml  Net    480 ml     Filed Weights   02/14/15 0500 02/15/15  0654 02/16/15 0642  Weight: 76.9 kg (169 lb 8.5 oz) 75.615 kg (166 lb 11.2 oz) 76.25 kg (168 lb 1.6 oz)    Exam:    General:  Appears calm and comfortable Cardiovascular: RRR, no m/r/g. No LE edema. Respiratory: CTA B. Normal respiratory effort. Musculoskeletal: grossly normal tone BUE/BLE,  Psychiatric: grossly normal mood and affect, speech fluent and appropriate    Scheduled Meds: . antiseptic oral rinse  7 mL Mouth Rinse BID  . atorvastatin  20 mg Oral Daily  . Chlorhexidine Gluconate Cloth  6 each Topical Q0600  . citalopram  20 mg Oral Daily  . diltiazem  120 mg Oral BID  . folic acid  1 mg Oral Daily  . guaiFENesin  1,200 mg Oral BID  . levothyroxine  100 mcg Oral QAC breakfast  . methotrexate  10 mg Oral Weekly  . metoprolol tartrate  50 mg Oral BID  . mupirocin ointment   Nasal BID  . rivaroxaban  15 mg Oral QPM  . sodium chloride  3 mL Intravenous Q12H  . traZODone  150 mg Oral QHS   Continuous Infusions:    Principal Problem:   Acute respiratory failure with hypoxemia Active Problems:   Diastolic CHF, acute on chronic   Macrocytic anemia   ARF (acute renal failure)   Hypothyroidism   Atrial fibrillation   Demand ischemia   Chronic respiratory failure with hypoxia   New onset atrial fibrillation   Acute on chronic diastolic congestive heart failure   Time spent 30 minutes

## 2015-02-16 NOTE — Progress Notes (Signed)
Notified Dr Kerry HoughMemon of patient heart rhythm alternating from NSR to AFib.  Order placed to modify Cardizem dosage.  Will continue to monitor patient.

## 2015-02-16 NOTE — Progress Notes (Signed)
Physical Therapy Treatment Patient Details Name: Sierra KalesShelby J Brashears MRN: [id number] DOB: 30-Sep-1933 Today's Date: 02/16/2015    History of Present Illness      PT Comments    Improved bed mobiity with min guard following cueing for hand placement for supine to sit and min assistance for sit to stand.  Gait training utilizing RW due to instable gait mechanics requiring cueing to improve staggered gait mechanics and min assistance required. Increased distance with gait mechanics.  Pt main compliant through session with itchiness, stopping during gait training to scratch her back.  End of session aid in room to give bath, chair alarm set.        Follow Up Recommendations        Equipment Recommendations       Recommendations for Other Services       Precautions / Restrictions      Mobility  Bed Mobility Overal bed mobility: Modified Independent Bed Mobility: Supine to Sit     Supine to sit: Min guard     General bed mobility comments: Cueing for hand placement to assist  Transfers Overall transfer level: Needs assistance Equipment used: Rolling walker (2 wheeled) Transfers: Sit to/from Stand Sit to Stand: Min guard         General transfer comment: cueing for hand placement to assist  Ambulation/Gait Ambulation/Gait assistance: Min assist Ambulation Distance (Feet): 400 Feet Assistive device: Rolling walker (2 wheeled) Gait Pattern/deviations: Trunk flexed;Shuffle   Gait velocity interpretation: Below normal speed for age/gender General Gait Details: gait is very slow with frequent loss of balance, needing to be stabilized by therapist   Stairs            Wheelchair Mobility    Modified Rankin (Stroke Patients Only)       Balance                                    Cognition Arousal/Alertness: Awake/alert Behavior During Therapy: WFL for tasks assessed/performed Overall Cognitive Status: Within Functional Limits for tasks assessed                      Exercises      General Comments        Pertinent Vitals/Pain Pain Assessment: 0-10 Pain Score: 5  Pain Location: Back, c/o itching back through session.    Home Living                      Prior Function            PT Goals (current goals can now be found in the care plan section) Progress towards PT goals: Progressing toward goals    Frequency       PT Plan Current plan remains appropriate    Co-evaluation             End of Session Equipment Utilized During Treatment: Gait belt;Oxygen Activity Tolerance: Patient limited by fatigue Patient left: in chair;with call bell/phone within reach;with nursing/sitter in room;with chair alarm set     Time: [id number] PT Time Calculation (min) (ACUTE ONLY): 49 min  Charges:  $Gait Training: 23-37 mins $Therapeutic Activity: 8-22 mins                    G Codes:      Juel BurrowCockerham, Casey Jo 02/16/2015, 4:39 PM

## 2015-02-17 LAB — BASIC METABOLIC PANEL
Anion gap: 7 (ref 5–15)
BUN: 62 mg/dL — AB (ref 6–23)
CALCIUM: 8.6 mg/dL (ref 8.4–10.5)
CO2: 26 mmol/L (ref 19–32)
CREATININE: 2.11 mg/dL — AB (ref 0.50–1.10)
Chloride: 100 mmol/L (ref 96–112)
GFR calc Af Amer: 25 mL/min — ABNORMAL LOW (ref >90)
GFR, EST NON AFRICAN AMERICAN: 21 mL/min — AB (ref >90)
GLUCOSE: 105 mg/dL — AB (ref 70–99)
Potassium: 4.3 mmol/L (ref 3.5–5.1)
Sodium: 133 mmol/L — ABNORMAL LOW (ref 135–145)

## 2015-02-17 LAB — CBC
HCT: 26.2 % — ABNORMAL LOW (ref 36.0–46.0)
Hemoglobin: 8.4 g/dL — ABNORMAL LOW (ref 12.0–15.0)
MCH: 31.9 pg (ref 26.0–34.0)
MCHC: 32.1 g/dL (ref 30.0–36.0)
MCV: 99.6 fL (ref 78.0–100.0)
Platelets: 232 10*3/uL (ref 150–400)
RBC: 2.63 MIL/uL — ABNORMAL LOW (ref 3.87–5.11)
RDW: 14.1 % (ref 11.5–15.5)
WBC: 8 10*3/uL (ref 4.0–10.5)

## 2015-02-17 MED ORDER — SODIUM CHLORIDE 0.9 % IV SOLN
INTRAVENOUS | Status: AC
  Administered 2015-02-17: 1000 mL via INTRAVENOUS

## 2015-02-17 MED ORDER — FUROSEMIDE 40 MG PO TABS: ORAL_TABLET | ORAL | Status: DC

## 2015-02-17 MED ORDER — METOPROLOL TARTRATE 50 MG PO TABS: 50.0000 mg | ORAL_TABLET | Freq: Two times a day (BID) | ORAL | Status: DC

## 2015-02-17 MED ORDER — DILTIAZEM HCL ER COATED BEADS 120 MG PO CP24: 120.0000 mg | ORAL_CAPSULE | Freq: Two times a day (BID) | ORAL | Status: DC

## 2015-02-17 MED ORDER — DILTIAZEM HCL ER COATED BEADS 120 MG PO CP24
120.0000 mg | ORAL_CAPSULE | Freq: Two times a day (BID) | ORAL | Status: DC
  Administered 2015-02-17 – 2015-02-20 (×6): 120 mg via ORAL
  Filled 2015-02-17 (×6): qty 1

## 2015-02-17 MED ORDER — RIVAROXABAN 15 MG PO TABS: 15.0000 mg | ORAL_TABLET | Freq: Every evening | ORAL | Status: DC

## 2015-02-17 MED ORDER — LEVALBUTEROL HCL 0.63 MG/3ML IN NEBU: 0.6300 mg | INHALATION_SOLUTION | Freq: Four times a day (QID) | RESPIRATORY_TRACT | Status: AC | PRN

## 2015-02-17 MED ORDER — HYDROCODONE-ACETAMINOPHEN 7.5-325 MG PO TABS: 1.0000 | ORAL_TABLET | Freq: Four times a day (QID) | ORAL | Status: DC | PRN

## 2015-02-17 NOTE — Progress Notes (Signed)
UR chart review completed.  

## 2015-02-17 NOTE — Progress Notes (Signed)
PROGRESS NOTE  Sierra KalesShelby J Singleton WUJ:[id number] DOB: Jun 19, 1933 DOA: 02/09/2015 PCP: Sierra Singleton  Summary: [age] PMH grade 2 diastolic dysfunction, oxygen-dependent COPD, presented with acute resp distress and was intubated immediately in ED. Admitted for acute resp failure, acute on chronic diastolic CHF, acute kidney injury. Condition rapidly improved and she was extubated within 24 hours and has remained stable on nasal cannula. Heart failure has stabilized. Other issues included demand ischemia which was asymptomatic with reassuring LVEF and normal wall motion on echocardiogram. Hospitalization prolonged by new-onset atrial fibrillation with difficult to control rate which has prevented discharge. Currently on oral diltiazem and metoprolol. Cardiology following. Plan to discharge to SNF.  Assessment/Plan: 1. New onset atrial fibrillation with RVR. CHADS2 = 3, CHADS-Vasc =6. Seen by cardiology. Started on metoprolol and diltiazem. Converted to sinus rhythm. On anticoagulation with Xarelto. 2. Acute hypoxic and hypercapnic respiratory failure, secondary to acute on chronic diastolic heart failure. Resolved. S/p extubation 3/18. Remained stable on [place]. 3. Acute on chronic diastolic CHF (grade 2). Acute component resolved. Adequate diuresis. Cardiology following. 4. Demand ischemia secondary to heart failure. Medical management per cardiology. Echocardiogram reassuring, LVEF 60-65%, normal wall motion.  5. AKI on CKD 3. Possibly related to diuresis. Renal function has not improved despite holding diuretics. Will give gentle fluid challenge. Repeat labs in am.  6. Macrocytic anemia, remains stable. 7. COPD. Stable. Started on bronchodilators. 8. Chronic hypoxic respiratory failure on home oxygen. Remains stable. 9. Hypothyroidism. TSH normal. 10. Depression. Celexa max dose 20mg /day >60yo. 11. Left hip pain. Xray is unremarkable. She feels it may be related to immobility. Improving. 12. Anemia.  No obvious signs of bleeding. Hemoglobin stable. 13. Hypotension. Patient noted to have systolic blood pressure in the 80s today, although she was asymptomatic and ambulating in her room. This is likely related to hypovolemia. Will give gentle hydration.  Code Status: full code DVT prophylaxis: xarelto Family Communication: none present Disposition Plan: d/c to SNF when renal function stable  Sierra BlinksJehanzeb [name], Singleton  [facility]  Pager [phone] If 7PM-7AM, please contact night-coverage at [email or web address], password [email or web address] 02/17/2015, 11:23 AM  LOS: 8 days   Consultants:  Pulmonology   Cardiology   Procedures:  ETT 3/17 >> 3/18  Echocardiogram Study Conclusions  - Left ventricle: The cavity size was normal. There was mild concentric hypertrophy. Systolic function was normal. The estimated ejection fraction was in the range of 60% to 65%. Wall motion was normal; there were no regional wall motion abnormalities. Features are consistent with a pseudonormal left ventricular filling pattern, with concomitant abnormal relaxation and increased filling pressure (grade 2 diastolic dysfunction). - Mitral valve: Mildly calcified annulus. There was mild to moderate regurgitation. - Left atrium: The atrium was moderately to severely dilated. - Right ventricle: The cavity size was mildly dilated. - Right atrium: The atrium was mildly to moderately dilated.  Impressions:  - Limited echocardiogram to assess left ventricular systolic function, which appears to be grossly normal.  Antibiotics:    HPI/Subjective: No complaints. No worsening shortness of breath, no chest pain. No dizziness or lightheadedness. Standing in room on my arrival  Objective: Filed Vitals:   02/16/15 2037 02/17/15 0653 02/17/15 0700 02/17/15 0800  BP: 119/77 63/38 73/47  78/52  Pulse: 71 51 52 49  Temp: 97.6 F (36.4 C) 97.7 F (36.5 C)    TempSrc: Oral Oral    Resp: 20 20    Height:       Weight:  76.878 kg (169 lb 7.8 oz)  SpO2: 100% 98%      Intake/Output Summary (Last 24 hours) at 02/17/15 1123 Last data filed at 02/17/15 0900  Gross per 24 hour  Intake    720 ml  Output   1050 ml  Net   -330 ml     Filed Weights   02/15/15 0654 02/16/15 0642 02/17/15 0653  Weight: 75.615 kg (166 lb 11.2 oz) 76.25 kg (168 lb 1.6 oz) 76.878 kg (169 lb 7.8 oz)    Exam:    General:  Appears calm and comfortable Cardiovascular: RRR, no m/r/g. No LE edema. Respiratory: coarse breath sounds at bases. Normal respiratory effort. Musculoskeletal: grossly normal tone BUE/BLE,  Psychiatric: grossly normal mood and affect, speech fluent and appropriate    Scheduled Meds: . antiseptic oral rinse  7 mL Mouth Rinse BID  . atorvastatin  20 mg Oral Daily  . Chlorhexidine Gluconate Cloth  6 each Topical Q0600  . citalopram  20 mg Oral Daily  . diltiazem  120 mg Oral BID  . folic acid  1 mg Oral Daily  . guaiFENesin  1,200 mg Oral BID  . levothyroxine  100 mcg Oral QAC breakfast  . methotrexate  10 mg Oral Weekly  . metoprolol tartrate  50 mg Oral BID  . mupirocin ointment   Nasal BID  . rivaroxaban  15 mg Oral QPM  . sodium chloride  3 mL Intravenous Q12H  . traZODone  150 mg Oral QHS   Continuous Infusions: . sodium chloride 1,000 mL (02/17/15 0928)    Principal Problem:   Acute respiratory failure with hypoxemia Active Problems:   Diastolic CHF, acute on chronic   Macrocytic anemia   ARF (acute renal failure)   Hypothyroidism   Atrial fibrillation   Demand ischemia   Chronic respiratory failure with hypoxia   New onset atrial fibrillation   Acute on chronic diastolic congestive heart failure   Time spent 30 minutes

## 2015-02-17 NOTE — Plan of Care (Signed)
Discussed with Dr. Kerry HoughMemon the patients BP & HR today.  Voiced to him that I held the Lopressor and the Cardizem.  MD stated with the  BP of 138/50 that the Cardizem could be given but to hold the Metoprolol.  The patient appears to be asymptomatic.  I will continue to monitor her.

## 2015-02-17 NOTE — Clinical Social Work Note (Signed)
CSW met with patient. Patient indicated that her son wanted her at a facility on [address].  CSW discussed that the number in the chart was not a working number and patient provided [phone] as her son's cell phone number.  Patient indicted that she has been at [facility] before, however if her son wanted her to go to [facility] she would go there.    CSW contacted patient's son Dion Body.  Mr. Zadie Rhine indicated that [facility] was close to his house and that he wanted to his home.    CSW spoke to [employer] at [facility].  Crystal advised the facility could offer patient a bed.  CSW advised that patient could potentially be discharged this weekend. Crystal advised that if patient was discharged this weekend Anderson Malta would need to be contacted at [phone], two hours before discharge.  Crystal also requested that [facility] fax patient's med list today to [phone].  CSW will fax AVS to Billings Hernandez, [place]

## 2015-02-17 NOTE — Plan of Care (Signed)
Problem: Phase I Progression Outcomes Goal: Progress activity as tolerated unless otherwise ordered Outcome: Completed/Met Date Met:  02/17/15 OOB doing activities of daily at the sink.

## 2015-02-18 ENCOUNTER — Inpatient Hospital Stay (HOSPITAL_COMMUNITY): Payer: Medicare Other

## 2015-02-18 LAB — BASIC METABOLIC PANEL
Anion gap: 6 (ref 5–15)
BUN: 62 mg/dL — ABNORMAL HIGH (ref 6–23)
CHLORIDE: 104 mmol/L (ref 96–112)
CO2: 26 mmol/L (ref 19–32)
Calcium: 8.9 mg/dL (ref 8.4–10.5)
Creatinine, Ser: 1.99 mg/dL — ABNORMAL HIGH (ref 0.50–1.10)
GFR calc Af Amer: 26 mL/min — ABNORMAL LOW (ref >90)
GFR calc non Af Amer: 23 mL/min — ABNORMAL LOW (ref >90)
GLUCOSE: 105 mg/dL — AB (ref 70–99)
Potassium: 4.4 mmol/L (ref 3.5–5.1)
SODIUM: 136 mmol/L (ref 135–145)

## 2015-02-18 LAB — ALBUMIN: ALBUMIN: 2.7 g/dL — AB (ref 3.5–5.2)

## 2015-02-18 MED ORDER — OXYMETAZOLINE HCL 0.05 % NA SOLN
2.0000 | Freq: Once | NASAL | Status: AC
  Administered 2015-02-18: 2 via NASAL
  Filled 2015-02-18: qty 15

## 2015-02-18 NOTE — Progress Notes (Signed)
PROGRESS NOTE  Sierra KalesShelby J Singleton WUJ:[id number] DOB: 05-Apr-1933 DOA: 02/09/2015 PCP: Ignatius SpeckingVYAS,DHRUV B., MD  Summary: [age] PMH grade 2 diastolic dysfunction, oxygen-dependent COPD, presented with acute resp distress and was intubated immediately in ED. Admitted for acute resp failure, acute on chronic diastolic CHF, acute kidney injury. Condition rapidly improved and she was extubated within 24 hours and has remained stable on nasal cannula. Heart failure has stabilized. Other issues included demand ischemia which was asymptomatic with reassuring LVEF and normal wall motion on echocardiogram. Hospitalization prolonged by new-onset atrial fibrillation with difficult to control rate which has prevented discharge. Currently on oral diltiazem and metoprolol. Cardiology following. Plan to discharge to SNF.  Assessment/Plan: 1. New onset atrial fibrillation with RVR. CHADS2 = 3, CHADS-Vasc =6. Seen by cardiology. Started on metoprolol and diltiazem. Converted to sinus rhythm. On anticoagulation with Xarelto. 2. Acute hypoxic and hypercapnic respiratory failure, secondary to acute on chronic diastolic heart failure. Resolved. S/p extubation 3/18. Remained stable on [place]. 3. Acute on chronic diastolic CHF (grade 2). Acute component resolved. Adequate diuresis. Cardiology following. Patient does complain of some shortness of breath today. Will repeat chest x-ray today. 4. Demand ischemia secondary to heart failure. Medical management per cardiology. Echocardiogram reassuring, LVEF 60-65%, normal wall motion.  5. AKI on CKD 3. Possibly related to diuresis. Renal function has not improved despite holding diuretics. Patient received gentle hydration overnight with only minimal improvement in renal function. Would avoid giving further IV fluids given heart status. Recheck labs in the morning.  6. Macrocytic anemia, remains stable. 7. COPD. Stable. Started on bronchodilators. 8. Chronic hypoxic respiratory failure on home  oxygen. Remains stable. 9. Hypothyroidism. TSH normal. 10. Depression. Celexa max dose 20mg /day >60yo. 11. Left hip pain. Xray is unremarkable. She feels it may be related to immobility. Improving. 12. Anemia. No obvious signs of bleeding. Hemoglobin stable. 13. Hypotension. Possibly related to hypovolemia. Blood pressure appears to have improved today with hydration overnight.  Code Status: full code DVT prophylaxis: xarelto Family Communication: none present Disposition Plan: d/c to SNF when renal function stable  Erick BlinksJehanzeb Floria Brandau, MD  [facility]  Pager [phone] If 7PM-7AM, please contact night-coverage at [email or web address], password [email or web address] 02/18/2015, 6:33 PM  LOS: 9 days   Consultants:  Pulmonology   Cardiology   Procedures:  ETT 3/17 >> 3/18  Echocardiogram Study Conclusions  - Left ventricle: The cavity size was normal. There was mild concentric hypertrophy. Systolic function was normal. The estimated ejection fraction was in the range of 60% to 65%. Wall motion was normal; there were no regional wall motion abnormalities. Features are consistent with a pseudonormal left ventricular filling pattern, with concomitant abnormal relaxation and increased filling pressure (grade 2 diastolic dysfunction). - Mitral valve: Mildly calcified annulus. There was mild to moderate regurgitation. - Left atrium: The atrium was moderately to severely dilated. - Right ventricle: The cavity size was mildly dilated. - Right atrium: The atrium was mildly to moderately dilated.  Impressions:  - Limited echocardiogram to assess left ventricular systolic function, which appears to be grossly normal.  Antibiotics:    HPI/Subjective: Complains of some mildly increased shortness of breath today. No other complaints.  Objective: Filed Vitals:   02/18/15 1500 02/18/15 1525 02/18/15 1527 02/18/15 1528  BP: 124/61     Pulse: 62     Temp: 98.5 F (36.9 C)      TempSrc: Oral     Resp: 20     Height:      Weight:  SpO2: 98% 83% 93% 95%    Intake/Output Summary (Last 24 hours) at 02/18/15 1833 Last data filed at 02/18/15 1200  Gross per 24 hour  Intake    240 ml  Output    400 ml  Net   -160 ml     Filed Weights   02/16/15 0642 02/17/15 0653 02/18/15 0621  Weight: 76.25 kg (168 lb 1.6 oz) 76.878 kg (169 lb 7.8 oz) 78.1 kg (172 lb 2.9 oz)    Exam:    General:  Appears calm and comfortable Cardiovascular: RRR, no m/r/g. No LE edema. Respiratory: clear breath sounds bilaterally. Normal respiratory effort. Musculoskeletal: grossly normal tone BUE/BLE,  Psychiatric: grossly normal mood and affect, speech fluent and appropriate    Scheduled Meds: . antiseptic oral rinse  7 mL Mouth Rinse BID  . atorvastatin  20 mg Oral Daily  . Chlorhexidine Gluconate Cloth  6 each Topical Q0600  . citalopram  20 mg Oral Daily  . diltiazem  120 mg Oral BID  . folic acid  1 mg Oral Daily  . guaiFENesin  1,200 mg Oral BID  . levothyroxine  100 mcg Oral QAC breakfast  . methotrexate  10 mg Oral Weekly  . metoprolol tartrate  50 mg Oral BID  . mupirocin ointment   Nasal BID  . rivaroxaban  15 mg Oral QPM  . sodium chloride  3 mL Intravenous Q12H  . traZODone  150 mg Oral QHS   Continuous Infusions:    Principal Problem:   Acute respiratory failure with hypoxemia Active Problems:   Diastolic CHF, acute on chronic   Macrocytic anemia   ARF (acute renal failure)   Hypothyroidism   Atrial fibrillation   Demand ischemia   Chronic respiratory failure with hypoxia   New onset atrial fibrillation   Acute on chronic diastolic congestive heart failure   Time spent 30 minutes

## 2015-02-18 NOTE — Progress Notes (Signed)
Text paged Dr. Kerry HoughMemon to let him know how the patient tolerated her ambulation.  She only could walk to the door and her sats were 83% on 2 LPM via n/c, so her O2 was increased to 3 lpm via n/c.  Then her O2 sat increased to 93% and she was ambulated back to the chair and once seated in the chair her O2 sats were 95%  The total distance of ambulation was approx. 20 feet.  I also notified him that the patients chest xray was available for viewing.  Patient was left in her chair in stable condition.

## 2015-02-19 LAB — BASIC METABOLIC PANEL
Anion gap: 9 (ref 5–15)
BUN: 59 mg/dL — ABNORMAL HIGH (ref 6–23)
CALCIUM: 8.6 mg/dL (ref 8.4–10.5)
CO2: 22 mmol/L (ref 19–32)
Chloride: 105 mmol/L (ref 96–112)
Creatinine, Ser: 1.79 mg/dL — ABNORMAL HIGH (ref 0.50–1.10)
GFR calc Af Amer: 30 mL/min — ABNORMAL LOW (ref >90)
GFR calc non Af Amer: 26 mL/min — ABNORMAL LOW (ref >90)
Glucose, Bld: 110 mg/dL — ABNORMAL HIGH (ref 70–99)
POTASSIUM: 4.1 mmol/L (ref 3.5–5.1)
Sodium: 136 mmol/L (ref 135–145)

## 2015-02-19 NOTE — Progress Notes (Signed)
Ambulated the patient approx 250 feet.  The patient for the most part remainded 93-94%.  She did c/o being tired and had minimal SOB.  Her sats did drop down in the 80's, but went back to the 90's once bumped up the O2 to 3 lpm n/c.  After she rested I turned the O2 back down to 2 lpm and she remained in the 90's.  She was placed back in the bed per her request and left on 2 LPM.  Dr. Kerry HoughMemon was notified of the ambulation event.  The patient did not want to walk until 3 pm.

## 2015-02-19 NOTE — Progress Notes (Signed)
PROGRESS NOTE  Sierra Singleton ZOX:[id number] DOB: 1933-09-21 DOA: 02/09/2015 PCP: Ignatius Specking., MD  Summary: 81yow PMH grade 2 diastolic dysfunction, oxygen-dependent COPD, presented with acute resp distress and was intubated immediately in ED. Admitted for acute resp failure, acute on chronic diastolic CHF, acute kidney injury. Condition rapidly improved and she was extubated within 24 hours and has remained stable on nasal cannula. Heart failure has stabilized. Other issues included demand ischemia which was asymptomatic with reassuring LVEF and normal wall motion on echocardiogram. Hospitalization prolonged by new-onset atrial fibrillation with difficult to control rate which has prevented discharge. Currently on oral diltiazem and metoprolol. Cardiology following. Plan to discharge to SNF.  Assessment/Plan: 1. New onset atrial fibrillation with RVR. CHADS2 = 3, CHADS-Vasc =6. Seen by cardiology. Started on metoprolol and diltiazem. Converted to sinus rhythm. On anticoagulation with Xarelto. 2. Acute hypoxic and hypercapnic respiratory failure, secondary to acute on chronic diastolic heart failure. Resolved. S/p extubation 3/18. Remained stable on [place]. 3. Acute on chronic diastolic CHF (grade 2). Acute component resolved. Adequate diuresis. Cardiology following. We'll likely restart diuretics in the morning. 4. Demand ischemia secondary to heart failure. Medical management per cardiology. Echocardiogram reassuring, LVEF 60-65%, normal wall motion.  5. AKI on CKD 3. Possibly related to diuresis. Mild improvement in renal function. Recheck labs in the morning. Possibly restarting diuretics tomorrow. 6. Macrocytic anemia, remains stable. 7. COPD. Stable. Started on bronchodilators. 8. Chronic hypoxic respiratory failure on home oxygen. Remains stable. 9. Hypothyroidism. TSH normal. 10. Depression. Celexa max dose /day >79yo. 11. Left hip pain. Xray is unremarkable. She feels it may be related  to immobility. Improving. 12. Anemia. No obvious signs of bleeding. Hemoglobin stable. 13. Hypotension. Possibly related to hypovolemia. Resolved.  Code Status: full code DVT prophylaxis: xarelto Family Communication: Discussed with son and daughter-in-law on 3/26 Disposition Plan: d/c to SNF when renal function stable  Erick Blinks, MD  [facility]  Pager [phone] If 7PM-7AM, please contact night-coverage at [email or web address], password [facility] 02/19/2015, 9:19 AM  LOS: 10 days   Consultants:  Pulmonology   Cardiology   Procedures:  ETT 3/17 >> 3/18  Echocardiogram Study Conclusions  - Left ventricle: The cavity size was normal. There was mild concentric hypertrophy. Systolic function was normal. The estimated ejection fraction was in the range of 60% to 65%. Wall motion was normal; there were no regional wall motion abnormalities. Features are consistent with a pseudonormal left ventricular filling pattern, with concomitant abnormal relaxation and increased filling pressure (grade 2 diastolic dysfunction). - Mitral valve: Mildly calcified annulus. There was mild to moderate regurgitation. - Left atrium: The atrium was moderately to severely dilated. - Right ventricle: The cavity size was mildly dilated. - Right atrium: The atrium was mildly to moderately dilated.  Impressions:  - Limited echocardiogram to assess left ventricular systolic function, which appears to be grossly normal.  Antibiotics:    HPI/Subjective: Felt short of breath while ambulating. Overall feels "rough".  Objective: Filed Vitals:   02/18/15 2140 02/18/15 2249 02/19/15 0500 02/19/15 0529  BP: 110/32   125/55  Pulse: 59 61  58  Temp: 97.7 F (36.5 C)   98.2 F (36.8 C)  TempSrc: Oral   Oral  Resp: Height:      Weight:   78 kg (171 lb 15.3 oz)   SpO2: 97% 96%  98%    Intake/Output Summary (Last 24 hours) at 02/19/15 0919 Last data filed at 02/19/15  0321  Gross per  24 hour  Intake    240 ml  Output      0 ml  Net    240 ml     Filed Weights   02/17/15 0653 02/18/15 0621 02/19/15 0500  Weight: 76.878 kg (169 lb 7.8 oz) 78.1 kg (172 lb 2.9 oz) 78 kg (171 lb 15.3 oz)    Exam:    General:  Appears calm and comfortable Cardiovascular: RRR, no m/r/g. No LE edema. Respiratory: scattered coarse breath sounds, but mostly clear. Normal respiratory effort. Musculoskeletal: grossly normal tone BUE/BLE,  Psychiatric: grossly normal mood and affect, speech fluent and appropriate    Scheduled Meds: . antiseptic oral rinse  7 mL Mouth Rinse BID  . atorvastatin  20 mg Oral Daily  . Chlorhexidine Gluconate Cloth  6 each Topical Q0600  . citalopram  20 mg Oral Daily  . diltiazem  120 mg Oral BID  . folic acid  1 mg Oral Daily  . guaiFENesin  1,200 mg Oral BID  . levothyroxine  100 mcg Oral QAC breakfast  . methotrexate  10 mg Oral Weekly  . metoprolol tartrate  50 mg Oral BID  . mupirocin ointment   Nasal BID  . rivaroxaban  15 mg Oral QPM  . sodium chloride  3 mL Intravenous Q12H  . traZODone  150 mg Oral QHS   Continuous Infusions:    Principal Problem:   Acute respiratory failure with hypoxemia Active Problems:   Diastolic CHF, acute on chronic   Macrocytic anemia   ARF (acute renal failure)   Hypothyroidism   Atrial fibrillation   Demand ischemia   Chronic respiratory failure with hypoxia   New onset atrial fibrillation   Acute on chronic diastolic congestive heart failure   Time spent 30 minutes

## 2015-02-19 NOTE — Progress Notes (Signed)
Attempted to ambulate this patient and she complained that she was tired and could not breathe. I voiced to her that the MD wanted her to walk so that we can see how she does and to be able to order O2 for her if needed since was going to be dsicharged tomorrow.  Asked her what time would be good for me to come back and she stated that 3 pm she would walk.     Dressings on her back was changed today and yesterday.

## 2015-02-19 NOTE — Progress Notes (Signed)
Checked patient Saturation 98 on 3lpm/[place]

## 2015-02-20 LAB — BASIC METABOLIC PANEL
ANION GAP: 6 (ref 5–15)
BUN: 52 mg/dL — ABNORMAL HIGH (ref 6–23)
CHLORIDE: 105 mmol/L (ref 96–112)
CO2: 26 mmol/L (ref 19–32)
CREATININE: 1.73 mg/dL — AB (ref 0.50–1.10)
Calcium: 8.6 mg/dL (ref 8.4–10.5)
GFR calc non Af Amer: 27 mL/min — ABNORMAL LOW (ref >90)
GFR, EST AFRICAN AMERICAN: 31 mL/min — AB (ref >90)
Glucose, Bld: 92 mg/dL (ref 70–99)
Potassium: 4.5 mmol/L (ref 3.5–5.1)
Sodium: 137 mmol/L (ref 135–145)

## 2015-02-20 MED ORDER — FUROSEMIDE 40 MG PO TABS: ORAL_TABLET | ORAL | Status: DC

## 2015-02-20 NOTE — Discharge Summary (Signed)
Physician Discharge Summary  Sierra Singleton BJY:[id number] DOB: [birth year] DOA: 02/09/2015  PCP: Ignatius Specking., MD  Admit date: 02/09/2015 Discharge date: 02/20/2015  Time spent: 40 minutes  Recommendations for Outpatient Follow-up:  1. Patient will be discharged to [facility] 2. Follow up with Cardiology in 1 week 3. Follow up with primary care physician in 2-3 weeks 4. Restart diuretics on 3/31 5. Basic metabolic panel and CBC on 3/31 to ensure that renal function and hemoglobin are stable.  Discharge Diagnoses:  Principal Problem:   Acute respiratory failure with hypoxemia Active Problems:   Diastolic CHF, acute on chronic   Macrocytic anemia   ARF (acute renal failure)   Hypothyroidism   Atrial fibrillation   Demand ischemia   Chronic respiratory failure with hypoxia   New onset atrial fibrillation   Acute on chronic diastolic congestive heart failure   Discharge Condition: improved  Diet recommendation: low salt  Filed Weights   02/18/15 0621 02/19/15 0500 02/20/15 0500  Weight: 78.1 kg (172 lb 2.9 oz) 78 kg (171 lb 15.3 oz) 78.3 kg (172 lb 9.9 oz)    History of present illness:  This is a 81 year old female with chronic diastolic congestive heart failure, oxygen dependent COPD who presents to the emergency room with respiratory distress and was intubated immediately. She was admitted with acute respiratory failure, acute on chronic diastolic congestive heart failure and acute kidney injury. She was admitted to the ICU for further treatments.  Hospital Course:  Acute on chronic respiratory failure. Patient was intubated from 3/17-3/18. She was diuresed with intravenous Lasix. She was followed in consultation by cardiology and pulmonology. Her respiratory status is now back to baseline with diuresis. She is chronically on 2 L and occasionally requires 3 L on exertion.  Acute on chronic diastolic congestive heart failure. Adequately diurese with intravenous Lasix.  She did develop acute renal failure likely related to diuresis. Diuresis is currently on hold and creatinine is slowly trending down. Would hold diuretics for another 2 days and restart on 3/31. She will need follow-up with cardiology. Currently, she appears to be nearly euvolemic.  COPD. Appears to be near baseline. Continue bronchodilators.  New-onset atrial fibrillation. CHADSVASC 6. Seen by cardiology and started on anticoagulation with Xarelto. She is currently rate controlled with diltiazem and metoprolol. TSH was found to be normal.  AKI on CKD stage III. Felt to be related to diuresis. Creatinine is currently improved. Current creatinine is 1.7. Baseline creatinine is 1.4. Continue to monitor  Procedures:  ETT 3/17 >> 3/18  Study Conclusions  - Left ventricle: The cavity size was normal. There was mild concentric hypertrophy. Systolic function was normal. The estimated ejection fraction was in the range of 60% to 65%. Wall motion was normal; there were no regional wall motion abnormalities. Features are consistent with a pseudonormal left ventricular filling pattern, with concomitant abnormal relaxation and increased filling pressure (grade 2 diastolic dysfunction). - Mitral valve: Mildly calcified annulus. There was mild to moderate regurgitation. - Left atrium: The atrium was moderately to severely dilated. - Right ventricle: The cavity size was mildly dilated. - Right atrium: The atrium was mildly to moderately dilated.  Impressions:  - Limited echocardiogram to assess left ventricular systolic function, which appears to be grossly normal.  Consultations:  Pulmonology  Cardiology  Discharge Exam: Filed Vitals:   02/20/15 0852  BP: 150/48  Pulse:   Temp:   Resp:     General: NAD Cardiovascular: s1, S2 RRR Respiratory: coarse crackles  at bases  Discharge Instructions   Discharge Instructions    (HEART FAILURE PATIENTS) Call MD:  Anytime  you have any of the following symptoms: 1) 3 pound weight gain in 24 hours or 5 pounds in 1 week 2) shortness of breath, with or without a dry hacking cough 3) swelling in the hands, feet or stomach 4) if you have to sleep on extra pillows at night in order to breathe.    Complete by:  As directed      Diet - low sodium heart healthy    Complete by:  As directed      Increase activity slowly    Complete by:  As directed           Current Discharge Medication List    START taking these medications   Details  diltiazem (CARDIZEM CD) 120 MG 24 hr capsule Take 1 capsule (120 mg total) by mouth 2 (two) times daily.    levalbuterol (XOPENEX) 0.63 MG/3ML nebulizer solution Take 3 mLs (0.63 mg total) by nebulization every 6 (six) hours as needed for wheezing or shortness of breath. Qty: 3 mL, Refills: 12    Rivaroxaban (XARELTO) 15 MG TABS tablet Take 1 tablet (15 mg total) by mouth every evening. Qty: 30 tablet      CONTINUE these medications which have CHANGED   Details  furosemide (LASIX) 40 MG tablet Alternate  and  daily. Start on 3/31 Qty: 30 tablet, Refills: 1    HYDROcodone-acetaminophen (NORCO) 7.5-325 MG per tablet Take 1 tablet by mouth every 6 (six) hours as needed for moderate pain. Qty: 30 tablet, Refills: 0    metoprolol (LOPRESSOR) 50 MG tablet Take 1 tablet (50 mg total) by mouth 2 (two) times daily.      CONTINUE these medications which have NOT CHANGED   Details  atorvastatin (LIPITOR) 20 MG tablet Take 20 mg by mouth daily.    citalopram (CELEXA) 40 MG tablet Take 40 mg by mouth daily.    folic acid (FOLVITE) 1 MG tablet Take 1 mg by mouth daily.    levothyroxine (SYNTHROID, LEVOTHROID) 100 MCG tablet Take 100 mcg by mouth daily before breakfast.    methotrexate 2.5 MG tablet Take 10 mg by mouth once a week. On Sunday.    traZODone (DESYREL) 150 MG tablet Take by mouth at bedtime.      STOP taking these medications     clopidogrel (PLAVIX) 75 MG  tablet        Allergies  Allergen Reactions  . Ambien [Zolpidem Tartrate]   . Sulfa Antibiotics Nausea And Vomiting  . Clindamycin/Lincomycin Swelling and Rash      The results of significant diagnostics from this hospitalization (including imaging, microbiology, ancillary and laboratory) are listed below for reference.    Significant Diagnostic Studies: Dg Chest Port 1 View  02/18/2015   CLINICAL DATA:  Followup congestive heart failure  EXAM: PORTABLE CHEST - 1 VIEW  COMPARISON:  02/07/2015  FINDINGS: Cardiomediastinal silhouette is stable. Persistent mild congestion/pulmonary edema. Endotracheal and NG tube has been removed. Question small bilateral pleural effusion. Again noted old fracture deformity of right humeral head.  IMPRESSION: Persistent mild congestion/ edema. Endotracheal and NG tube has been removed. Question small bilateral pleural effusion.   Electronically Signed   By: Natasha Mead M.D.   On: 02/18/2015 13:26   Dg Chest Port 1 View  02/10/2015   CLINICAL DATA:  Acute respiratory failure  EXAM: PORTABLE CHEST - 1 VIEW  COMPARISON:  02/09/2015  FINDINGS: The endotracheal tube tip is 3 cm above the carina. The nasogastric tube extends into the stomach. Vascular and interstitial congestive changes persist. Central and basilar airspace opacities are unchanged or slightly worsened. The extreme bases are excluded from the image.  IMPRESSION: Support equipment appears satisfactorily positioned. Congestive heart failure, unchanged or slightly worsened.   Electronically Signed   By: Ellery Plunkaniel R Mitchell M.D.   On: 02/10/2015 06:09   Dg Chest Portable 1 View  02/09/2015   CLINICAL DATA:  Evaluation for endotracheal tube, shortness of breath, history of COPD  EXAM: PORTABLE CHEST - 1 VIEW  COMPARISON:  12/29/2014  FINDINGS: Endotracheal tube tip about 4.5 cm above the carina. Mild cardiac enlargement. Similar to prior study there is vascular congestion and moderate to severe interstitial  prominence, the severity of which is minimally worse when compared to the prior study. Hyperinflation consistent with COPD.  IMPRESSION: Congestive heart failure with moderate to severe interstitial edema, similar but slightly worse when compared to prior study.   Electronically Signed   By: Esperanza Heiraymond  Rubner M.D.   On: 02/09/2015 10:49   Dg Hip Unilat With Pelvis 2-3 Views Left  02/14/2015   CLINICAL DATA:  Left hip pain, no known injury, initial encounter  EXAM: LEFT HIP (WITH PELVIS) 2-3 VIEWS  COMPARISON:  None.  FINDINGS: No acute fracture or dislocation is noted. The pelvic ring is intact. No gross soft tissue abnormality is noted. Mild vascular calcifications are seen.  IMPRESSION: No acute abnormality noted.   Electronically Signed   By: Alcide CleverMark  Lukens M.D.   On: 02/14/2015 10:34    Microbiology: No results found for this or any previous visit (from the past 240 hour(s)).   Labs: Basic Metabolic Panel:  Recent Labs Lab 02/16/15 0634 02/17/15 0645 02/18/15 0643 02/19/15 0613 02/20/15 0614  NA 134* 133* 136 136 137  K 3.9 4.3 4.4 4.1 4.5  CL 99 100 104 105 105  CO2 28 26 26 22 26   GLUCOSE 93 105* 105* 110* 92  BUN 62* 62* 62* 59* 52*  CREATININE 2.11* 2.11* 1.99* 1.79* 1.73*  CALCIUM 8.8 8.6 8.9 8.6 8.6   Liver Function Tests:  Recent Labs Lab 02/18/15 0643  ALBUMIN 2.7*   No results for input(s): LIPASE, AMYLASE in the last 168 hours. No results for input(s): AMMONIA in the last 168 hours. CBC:  Recent Labs Lab 02/14/15 1130 02/15/15 0612 02/16/15 0634 02/17/15 0900  WBC 7.2 7.2 6.7 8.0  HGB 9.9* 8.8* 8.6* 8.4*  HCT 31.8* 27.2* 27.0* 26.2*  MCV 100.0 99.3 99.3 99.6  PLT 275 198 216 232   Cardiac Enzymes: No results for input(s): CKTOTAL, CKMB, CKMBINDEX, TROPONINI in the last 168 hours. BNP: BNP (last 3 results)  Recent Labs  12/29/14 1258 02/09/15 1030  BNP 593.0* 676.0*    ProBNP (last 3 results) No results for input(s): PROBNP in the last 8760  hours.  CBG: No results for input(s): GLUCAP in the last 168 hours.     Signed:  MEMON,JEHANZEB  [facility] 02/20/2015, 10:17 AM

## 2015-02-20 NOTE — Clinical Social Work Placement (Signed)
Clinical Social Work Department CLINICAL SOCIAL WORK PLACEMENT NOTE 02/20/2015  Patient:  Sierra Singleton,Sierra Singleton  Account Number:  [id number] Admit date:  02/09/2015  Clinical Social Worker:  Tretha SciaraHEATHER Kanani Mowbray, LCSW  Date/time:  02/15/2015 09:56 AM  Clinical Social Work is seeking post-discharge placement for this patient at the following level of care:   SKILLED NURSING   (*CSW will update this form in Epic as items are completed)   02/13/2015  Patient/family provided with [facility] Department of Clinical Social Work's list of facilities offering this level of care within the geographic area requested by the patient (or if unable, by the patient's family).  02/13/2015  Patient/family informed of their freedom to choose among providers that offer the needed level of care, that participate in Medicare, Medicaid or managed care program needed by the patient, have an available bed and are willing to accept the patient.  02/13/2015  Patient/family informed of [facility]' ownership interest in [facility], as well as of the fact that they are under no obligation to receive care at this facility.  PASARR submitted to EDS on 02/14/2015 PASARR number received on   FL2 transmitted to all facilities in geographic area requested by pt/family on  02/14/2015 FL2 transmitted to all facilities within larger geographic area on   Patient informed that his/her managed care company has contracts with or will negotiate with  certain facilities, including the following:     Patient/family informed of bed offers received:  02/16/2015 [facility] Physician recommends and patient chooses bed at    [facility] to be [facility] on  02/20/2015 Patient to be transferred to facility by Son,  Youlanda RoysBarry Glosson Patient and family notified of transfer on 02/20/2015 Name of family member notified:  Youlanda RoysBarry Glosson  The following physician  request were entered in Epic:   Additional Comments:   Tretha SciaraHeather Jmarion Christiano, LCSW [phone]

## 2015-02-20 NOTE — Progress Notes (Signed)
Prepared the patient to be discharged [facility].  Packet was given to her son and patient was dressed and home portable O2.  The dressings on her back was intact.  Patient was transferred off the floor via w/c with her family in stable condition.  Report was called to [facility] and report was given to [place].  She verbalized understanding and was told she could call the floor if she has any questions once the patient arrives.

## 2015-02-20 NOTE — Clinical Social Work Note (Signed)
CSW facilitated discharge.  CSW notified [facility]leton at [facility] of patient's discharge today. CSW notified patient of discharge. CSW contacted patient's son, Youlanda RoysBarry Glosson, and advised that patient was being discharged.  He indicated that he would pick patient up today around 3.  CSW advised Mr. Seymour BarsGlosson that he would need to go to patient's home to retrieve her oxygen. He indicated that he would come to the hospital to pick up the keys, go get oxygen and then come back and pick up patient.  CSW advised that [facility] would be [facility].  CSW signing off.   Tretha SciaraHeather Julyana [place], [place] [phone]

## 2015-02-20 NOTE — Progress Notes (Signed)
UR chart review completed.  

## 2015-03-01 ENCOUNTER — Encounter: Payer: Self-pay | Admitting: Physician Assistant

## 2015-03-01 ENCOUNTER — Ambulatory Visit (INDEPENDENT_AMBULATORY_CARE_PROVIDER_SITE_OTHER): Payer: Medicare Other | Admitting: Physician Assistant

## 2015-03-01 VITALS — BP 134/42 | HR 67 | Ht 64.0 in | Wt 171.0 lb

## 2015-03-01 DIAGNOSIS — I480 Paroxysmal atrial fibrillation: Secondary | ICD-10-CM

## 2015-03-01 DIAGNOSIS — I5033 Acute on chronic diastolic (congestive) heart failure: Secondary | ICD-10-CM | POA: Diagnosis not present

## 2015-03-01 NOTE — Assessment & Plan Note (Signed)
Patient has some fluid overload on exam today. Will increase her Lasix to 40 mg daily for 3 days then decrease to 20 mg alternating with 40 mg every other day. 2 g sodium diet at the rehabilitation facility is recommended. BMP today and in one week. Follow-up with Dr.Koneswaran in 1 month.

## 2015-03-01 NOTE — Addendum Note (Signed)
Addended by: Kerney ElbePINNIX, Sheron Tallman G on: 03/01/2015 12:59 PM   Modules accepted: Orders

## 2015-03-01 NOTE — Progress Notes (Addendum)
Cardiology Office Note   Date:  03/01/2015   ID:  Sierra Singleton, DOB 1933/10/26, MRN [id number]  PCP:  Sierra Singleton,Sierra B., MD  Cardiologist:  Dr. Purvis SheffieldKoneswaran (previously Dr. Eden EmmsNishan but [facility])  Chief Complaint: swelling    History of Present Illness: Sierra Singleton is a 81 y.o. female who presents for post hospital follow-up. She was admitted to the hospital with acute diastolic heart failure with interstitial edema and respiratory failure requiring intubation. She had positive enzymes likely from demand ischemia. She also had atrial fibrillation and converted to sinus rhythm with IV amiodarone. She was placed on Xarelto 15 mg daily. 2-D echo on 02/10/15 showed normal LV function EF 60-65% with grade 2 diastolic dysfunction and mild to moderate MR. Left atrium was moderately to severely dilated. Her creatinine did go up with diuresis and Lasix was decreased to 20 mg alternating with 40 mg every other day. Patient also has oxygen dependent COPD and chronic kidney disease stage III. Creatinine at discharge is 1.73. She is in a rehabilitation facility and creatinine on 02/23/15 was 2.05.  The patient comes in today accompanied by caregiver from the rehabilitation facility. Patient is miserable from an outbreak of her chronic Hailey hailey rash and hasn't been able to see her dermatologist. She says she's had increase in lower extremity edema because she is not on a heart healthy diet. Her breathing is just okay. She is frustrating with being at the rehabilitation facility and hoping to get home soon.    Past Medical History  Diagnosis Date  . Rash     Zebedee IbaHaley Haley rash  . Anxiety   . Thyroid disease   . Hyperlipidemia   . Hypertension   . COPD (chronic obstructive pulmonary disease)   . Poor historian   . Carotid artery disease   . Atrial fibrillation 02/11/2015  . Chronic respiratory failure with hypoxia 02/11/2015    Past Surgical History  Procedure Laterality Date  .  Back surgery       Current Outpatient Prescriptions  Medication Sig Dispense Refill  . atorvastatin (LIPITOR) 20 MG tablet Take 20 mg by mouth daily.    . cetirizine (ZYRTEC) 10 MG tablet Take 10 mg by mouth daily.    . citalopram (CELEXA) 40 MG tablet Take 40 mg by mouth daily.    Marland Kitchen. diltiazem (CARDIZEM CD) 120 MG 24 hr capsule Take 1 capsule (120 mg total) by mouth 2 (two) times daily.    . folic acid (FOLVITE) 1 MG tablet Take 1 mg by mouth daily.    . furosemide (LASIX) 40 MG tablet Alternate 20mg  and 40mg  daily. Start on 3/31 30 tablet 1  . HYDROcodone-acetaminophen (NORCO) 7.5-325 MG per tablet Take 1 tablet by mouth every 6 (six) hours as needed for moderate pain. 30 tablet 0  . levalbuterol (XOPENEX) 0.63 MG/3ML nebulizer solution Take 3 mLs (0.63 mg total) by nebulization every 6 (six) hours as needed for wheezing or shortness of breath. 3 mL 12  . levothyroxine (SYNTHROID, LEVOTHROID) 100 MCG tablet Take 100 mcg by mouth daily before breakfast.    . methotrexate 2.5 MG tablet Take 10 mg by mouth once a week. On Sunday.    . metoprolol (LOPRESSOR) 50 MG tablet Take 1 tablet (50 mg total) by mouth 2 (two) times daily.    . Rivaroxaban (XARELTO) 15 MG TABS tablet Take 1 tablet (15 mg total) by mouth every evening. 30 tablet   . traZODone (DESYREL) 150 MG  tablet Take by mouth at bedtime.     No current facility-administered medications for this visit.    Allergies:   Ambien; Sulfa antibiotics; and Clindamycin/lincomycin    Social History:  The patient  reports that she has never smoked. She does not have any smokeless tobacco history on file. She reports that she does not drink alcohol or use illicit drugs.   Family History:  The patient's    family history includes Cancer in her father; Stroke in her mother.    ROS:  Please see the history of present illness.   Otherwise, review of systems are positive for significant outbreak in her chronic rash with open sores on her back and  under her breasts and groin.   All other systems are reviewed and negative.    PHYSICAL EXAM: VS:  BP 134/42 mmHg  Pulse 67  Ht  (1.626 m)  Wt 171 lb (77.565 kg)  BMI 29.34 kg/m2  SpO2 96% , BMI Body mass index is 29.34 kg/(m^2). GEN: Elderly, in a wheelchair, on oxygen  Neck: no JVD, HJR, carotid bruits, or masses Cardiac: RRR; 1 to 2/6 systolic murmur at the left sternal border, no gallop, rubs, thrill or heave,  Respiratory:  Decreased breath sounds with crackles at the bases  GI: soft, nontender, nondistended, + BS MS: no deformity or atrophy Extremities: +1 ankle edema bilaterally otherwise without  cyanosis, clubbing, good distal pulses bilaterally.  Skin: Significant rash on her back with open wounds and multiple bandages Neuro:  Strength and sensation are intact    EKG:  EKG is ordered today. The ekg ordered today demonstrates normal sinus rhythm, normal EKG   Recent Labs: 02/09/2015: ALT 14; B Natriuretic Peptide 676.0*; TSH 1.960 02/17/2015: Hemoglobin 8.4*; Platelets 232 02/20/2015: BUN 52*; Creatinine 1.73*; Potassium 4.5; Sodium 137    Lipid Panel    Component Value Date/Time   TRIG 121 02/09/2015 2004      Wt Readings from Last 3 Encounters:  03/01/15 171 lb (77.565 kg)  02/20/15 172 lb 9.9 oz (78.3 kg)  01/02/15 179 lb 14.3 oz (81.6 kg)      Other studies Reviewed: Additional studies/ records that were reviewed today include and review of the records demonstrates: Echocardiogram 02/10/2015 Left ventricle: The cavity size was normal. There was mild   concentric hypertrophy. Systolic function was normal. The   estimated ejection fraction was in the range of 60% to 65%. Wall   motion was normal; there were no regional wall motion   abnormalities. Features are consistent with a pseudonormal left   ventricular filling pattern, with concomitant abnormal relaxation   and increased filling pressure (grade 2 diastolic dysfunction). - Mitral valve: Mildly  calcified annulus. There was mild to   moderate regurgitation. - Left atrium: The atrium was moderately to severely dilated. - Right ventricle: The cavity size was mildly dilated. - Right atrium: The atrium was mildly to moderately dilated.    ASSESSMENT AND PLAN: Acute on chronic diastolic congestive heart failure Patient has some fluid overload on exam today. Will increase her Lasix to 40 mg daily for 3 days then decrease to 20 mg alternating with 40 mg every other day. 2 g sodium diet at the rehabilitation facility is recommended. BMP today and in one week. Follow-up with Dr.Koneswaran in 1 month.   Atrial fibrillation Is maintaining normal sinus rhythm on metoprolol and Xarelto and Cardizem.   COPD (chronic obstructive pulmonary disease) On home O2 at 2 L   ARF (  acute renal failure) Creatinine was up to 2.05 on 02/23/15. We will repeat labs today and in one week.      Elson Clan, PA-C  03/01/2015 11:38 AM    [facility] [address] [place], [place], [place]  [address] Phone: [phone]; Fax: [phone]

## 2015-03-01 NOTE — Assessment & Plan Note (Signed)
On home O2 at 2 L

## 2015-03-01 NOTE — Assessment & Plan Note (Signed)
Creatinine was up to 2.05 on 02/23/15. We will repeat labs today and in one week.

## 2015-03-01 NOTE — Patient Instructions (Signed)
Your physician recommends that you schedule a follow-up appointment in: 1 month with Dr. Purvis SheffieldKoneswaran  Your physician has recommended you make the following change in your medication:   Increase Lasix to 40 mg Daily for the next three days and then resume alternating dosing of Lasix as before.  Your physician recommends that you return for lab work in: Today and in one week .   You have been given a copy of a Low salt diet  Thank you for choosing [facility]!

## 2015-03-01 NOTE — Addendum Note (Signed)
Addended by: Dyann KiefLENZE, [place] Strieter M on: 03/01/2015 12:56 PM   Modules accepted: Level of Service

## 2015-03-01 NOTE — Assessment & Plan Note (Signed)
Is maintaining normal sinus rhythm on metoprolol and Xarelto and Cardizem.

## 2015-03-22 ENCOUNTER — Emergency Department (HOSPITAL_COMMUNITY): Payer: Medicare Other

## 2015-03-22 ENCOUNTER — Observation Stay (HOSPITAL_COMMUNITY)
Admission: EM | Admit: 2015-03-22 | Discharge: 2015-03-25 | Disposition: A | Discharge status: Skilled Nursing Facility | Payer: Medicare Other | Attending: Family Medicine | Admitting: Family Medicine

## 2015-03-22 ENCOUNTER — Encounter (HOSPITAL_COMMUNITY): Payer: Self-pay | Admitting: *Deleted

## 2015-03-22 DIAGNOSIS — W050XXA Fall from non-moving wheelchair, initial encounter: Secondary | ICD-10-CM | POA: Insufficient documentation

## 2015-03-22 DIAGNOSIS — S32501A Unspecified fracture of right pubis, initial encounter for closed fracture: Secondary | ICD-10-CM | POA: Diagnosis present

## 2015-03-22 DIAGNOSIS — S329XXA Fracture of unspecified parts of lumbosacral spine and pelvis, initial encounter for closed fracture: Secondary | ICD-10-CM

## 2015-03-22 DIAGNOSIS — S32599A Other specified fracture of unspecified pubis, initial encounter for closed fracture: Secondary | ICD-10-CM | POA: Diagnosis present

## 2015-03-22 DIAGNOSIS — Z79899 Other long term (current) drug therapy: Secondary | ICD-10-CM | POA: Diagnosis not present

## 2015-03-22 DIAGNOSIS — J449 Chronic obstructive pulmonary disease, unspecified: Secondary | ICD-10-CM | POA: Diagnosis not present

## 2015-03-22 DIAGNOSIS — Z7901 Long term (current) use of anticoagulants: Secondary | ICD-10-CM | POA: Diagnosis not present

## 2015-03-22 DIAGNOSIS — I4891 Unspecified atrial fibrillation: Secondary | ICD-10-CM | POA: Diagnosis present

## 2015-03-22 DIAGNOSIS — E039 Hypothyroidism, unspecified: Secondary | ICD-10-CM | POA: Diagnosis present

## 2015-03-22 DIAGNOSIS — I129 Hypertensive chronic kidney disease with stage 1 through stage 4 chronic kidney disease, or unspecified chronic kidney disease: Secondary | ICD-10-CM | POA: Diagnosis not present

## 2015-03-22 DIAGNOSIS — S32591A Other specified fracture of right pubis, initial encounter for closed fracture: Secondary | ICD-10-CM | POA: Diagnosis not present

## 2015-03-22 DIAGNOSIS — N189 Chronic kidney disease, unspecified: Secondary | ICD-10-CM | POA: Diagnosis not present

## 2015-03-22 DIAGNOSIS — Q828 Other specified congenital malformations of skin: Secondary | ICD-10-CM | POA: Diagnosis not present

## 2015-03-22 DIAGNOSIS — Z66 Do not resuscitate: Secondary | ICD-10-CM | POA: Insufficient documentation

## 2015-03-22 DIAGNOSIS — E785 Hyperlipidemia, unspecified: Secondary | ICD-10-CM | POA: Diagnosis not present

## 2015-03-22 DIAGNOSIS — I5032 Chronic diastolic (congestive) heart failure: Secondary | ICD-10-CM | POA: Diagnosis not present

## 2015-03-22 DIAGNOSIS — I509 Heart failure, unspecified: Secondary | ICD-10-CM | POA: Diagnosis not present

## 2015-03-22 DIAGNOSIS — J9610 Chronic respiratory failure, unspecified whether with hypoxia or hypercapnia: Secondary | ICD-10-CM | POA: Insufficient documentation

## 2015-03-22 DIAGNOSIS — E46 Unspecified protein-calorie malnutrition: Secondary | ICD-10-CM | POA: Diagnosis present

## 2015-03-22 DIAGNOSIS — W19XXXA Unspecified fall, initial encounter: Secondary | ICD-10-CM

## 2015-03-22 HISTORY — DX: Other specified congenital malformations of skin: Q82.8

## 2015-03-22 MED ORDER — MORPHINE SULFATE 4 MG/ML IJ SOLN
4.0000 mg | Freq: Once | INTRAMUSCULAR | Status: AC
  Administered 2015-03-23: 4 mg via INTRAVENOUS
  Filled 2015-03-22: qty 1

## 2015-03-22 NOTE — ED Provider Notes (Signed)
CSN: [id number]     Arrival date & time 03/22/15  1940 History   First MD Initiated Contact with Patient 03/22/15 1950     Chief Complaint  Patient presents with  . Fall     (Consider location/radiation/quality/duration/timing/severity/associated sxs/prior Treatment) Patient is a 81 y.o. female presenting with fall and leg pain.  Fall This is a new problem. Episode onset: today. Episode frequency: once. The problem has been resolved.  Leg Pain Location:  Hip Injury: yes   Mechanism of injury: fall   Fall:    Fall occurred: slippedo out of wheelchair.     Point of impact:  Head (right side) Hip location:  R hip Pain details:    Quality:  Dull   Radiates to:  Does not radiate   Severity:  Severe   Onset quality:  Sudden   Timing:  Constant   Progression:  Unchanged Relieved by:  Rest and immobilization (fentanyl given by EMS PTA) Exacerbated by: movement. Associated symptoms: no back pain and no neck pain   Associated symptoms comment:  Rash - diffuse, very painful. Head contusion. No LOC.   Past Medical History  Diagnosis Date  . Rash     [name] rash  . Anxiety   . Thyroid disease   . Hyperlipidemia   . Hypertension   . COPD (chronic obstructive pulmonary disease)   . Poor historian   . Carotid artery disease   . Atrial fibrillation 02/11/2015  . Chronic respiratory failure with hypoxia 02/11/2015   Past Surgical History  Procedure Laterality Date  . Back surgery     Family History  Problem Relation Age of Onset  . Cancer Father   . Stroke Mother    History  Substance Use Topics  . Smoking status: Never Smoker   . Smokeless tobacco: Not on file  . Alcohol Use: No   OB History    No data available     Review of Systems  Musculoskeletal: Negative for back pain and neck pain.  All other systems reviewed and are negative.     Allergies  Ambien; Sulfa antibiotics; and Clindamycin/lincomycin  Home Medications   Prior to Admission medications    Medication Sig Start Date End Date Taking? Authorizing Provider  atorvastatin (LIPITOR) 20 MG tablet Take 20 mg by mouth daily.    Historical Provider, MD  cetirizine (ZYRTEC) 10 MG tablet Take 10 mg by mouth daily.    Historical Provider, MD  citalopram (CELEXA) 40 MG tablet Take 40 mg by mouth daily.    Historical Provider, MD  diltiazem (CARDIZEM CD) 120 MG 24 hr capsule Take 1 capsule (120 mg total) by mouth 2 (two) times daily. 02/17/15   Erick Blinks, MD  folic acid (FOLVITE) 1 MG tablet Take 1 mg by mouth daily.    Historical Provider, MD  furosemide (LASIX) 40 MG tablet Alternate  and  daily. Start on 3/31 02/20/15   Erick Blinks, MD  HYDROcodone-acetaminophen (NORCO) 7.5-325 MG per tablet Take 1 tablet by mouth every 6 (six) hours as needed for moderate pain. 02/17/15   Erick Blinks, MD  levalbuterol Pauline Aus) 0.63 MG/3ML nebulizer solution Take 3 mLs (0.63 mg total) by nebulization every 6 (six) hours as needed for wheezing or shortness of breath. 02/17/15   Erick Blinks, MD  levothyroxine (SYNTHROID, LEVOTHROID) 100 MCG tablet Take 100 mcg by mouth daily before breakfast.    Historical Provider, MD  methotrexate 2.5 MG tablet Take 10 mg by mouth once a week.  On Sunday.    Historical Provider, MD  metoprolol (LOPRESSOR) 50 MG tablet Take 1 tablet (50 mg total) by mouth 2 (two) times daily. 02/17/15   Erick BlinksJehanzeb Memon, MD  Rivaroxaban (XARELTO) 15 MG TABS tablet Take 1 tablet (15 mg total) by mouth every evening. 02/17/15   Erick BlinksJehanzeb Memon, MD  traZODone (DESYREL) 150 MG tablet Take by mouth at bedtime.    Historical Provider, MD   BP 107/51 mmHg  Pulse 84  Temp(Src) 98.4 F (36.9 C) (Oral)  Resp 14  SpO2 98% Physical Exam  Constitutional: She is oriented to person, place, and time. She appears well-developed and well-nourished. No distress.  HENT:  Head: Normocephalic and atraumatic. Head is without raccoon's eyes and without Battle's sign.  Nose: Nose normal.  Eyes:  Conjunctivae and EOM are normal. Pupils are equal, round, and reactive to light. No scleral icterus.  Neck: No spinous process tenderness and no muscular tenderness present.  Cardiovascular: Normal rate, regular rhythm, normal heart sounds and intact distal pulses.   No murmur heard. Pulmonary/Chest: Effort normal and breath sounds normal. She has no rales. She exhibits no tenderness.  Abdominal: Soft. There is no tenderness. There is no rebound and no guarding.  Musculoskeletal: Normal range of motion. She exhibits no edema.       Right hip: She exhibits tenderness and bony tenderness (NV intact distally). She exhibits normal range of motion (pain with external rotation) and no deformity.       Thoracic back: She exhibits no tenderness and no bony tenderness.       Lumbar back: She exhibits no tenderness and no bony tenderness.  No evidence of trauma to extremities, except as noted.  2+ distal pulses.    Neurological: She is alert and oriented to person, place, and time.  Skin: Skin is warm and dry. Rash noted.  Severe blistering rash, primarily in skin folds of groin and torso, also on back  Psychiatric: She has a normal mood and affect.  Nursing note and vitals reviewed.   ED Course  Procedures (including critical care time) Labs Review Labs Reviewed - No data to display  Imaging Review Ct Head Wo Contrast  03/22/2015   CLINICAL DATA:  81 year old female status post fall out of wheelchair  EXAM: CT HEAD WITHOUT CONTRAST  CT CERVICAL SPINE WITHOUT CONTRAST  TECHNIQUE: Multidetector CT imaging of the head and cervical spine was performed following the standard protocol without intravenous contrast. Multiplanar CT image reconstructions of the cervical spine were also generated.  COMPARISON:  Prior CT scan of the head and cervical spine 05/08/2009  FINDINGS: CT HEAD FINDINGS  Negative for acute intracranial hemorrhage, acute infarction, mass, mass effect, hydrocephalus or midline shift.  Gray-white differentiation is preserved throughout. Small right occipital scalp contusion. No evidence of underlying calvarial fracture. Globes and orbits are symmetric bilaterally. Normal aeration of the mastoid air cells and the visualized paranasal sinuses.  CT CERVICAL SPINE FINDINGS  No acute fracture, malalignment or prevertebral soft tissue swelling. Exaggerated thoracic kyphosis. Mild multilevel cervical spondylosis without focality. No acute soft tissue abnormality. Biapical pulmonary emphysema.  IMPRESSION: CT HEAD  1. No acute intracranial abnormality. 2. Small right occipital scalp contusion. CT CSPINE  1. No acute fracture or malalignment. 2. Pulmonary emphysema. 3. Exaggerated thoracic kyphosis.   Electronically Signed   By: Malachy MoanHeath  McCullough M.D.   On: 03/22/2015 22:04   Ct Cervical Spine Wo Contrast  03/22/2015   CLINICAL DATA:  81 year old female status post fall out  of wheelchair  EXAM: CT HEAD WITHOUT CONTRAST  CT CERVICAL SPINE WITHOUT CONTRAST  TECHNIQUE: Multidetector CT imaging of the head and cervical spine was performed following the standard protocol without intravenous contrast. Multiplanar CT image reconstructions of the cervical spine were also generated.  COMPARISON:  Prior CT scan of the head and cervical spine 05/08/2009  FINDINGS: CT HEAD FINDINGS  Negative for acute intracranial hemorrhage, acute infarction, mass, mass effect, hydrocephalus or midline shift. Gray-white differentiation is preserved throughout. Small right occipital scalp contusion. No evidence of underlying calvarial fracture. Globes and orbits are symmetric bilaterally. Normal aeration of the mastoid air cells and the visualized paranasal sinuses.  CT CERVICAL SPINE FINDINGS  No acute fracture, malalignment or prevertebral soft tissue swelling. Exaggerated thoracic kyphosis. Mild multilevel cervical spondylosis without focality. No acute soft tissue abnormality. Biapical pulmonary emphysema.  IMPRESSION: CT HEAD   1. No acute intracranial abnormality. 2. Small right occipital scalp contusion. CT CSPINE  1. No acute fracture or malalignment. 2. Pulmonary emphysema. 3. Exaggerated thoracic kyphosis.   Electronically Signed   By: Malachy Moan M.D.   On: 03/22/2015 22:04   Dg Hip Unilat With Pelvis 2-3 Views Left  03/22/2015   CLINICAL DATA:  81 year old female with right leg pain after fall  EXAM: LEFT HIP (WITH PELVIS) 2-3 VIEWS  COMPARISON:  Prior pelvic and left hip radiographs 02/14/2015  FINDINGS: Acute minimally displaced fractures of the right superior and inferior pubic rami. The right proximal femur remains intact. The bones appear osteopenic. Mild atherosclerotic vascular calcifications.  IMPRESSION: 1. Minimally displaced fractures of the right superior and inferior pubic rami.   Electronically Signed   By: Malachy Moan M.D.   On: 03/22/2015 22:05  All radiology studies independently viewed by me.      EKG Interpretation None      MDM   Final diagnoses:  Fall  Fracture of multiple pubic rami, right, closed, initial encounter    81 yo female with mechanical fall from wheelchair.  History significantly limited by age and fixation on skin rash.  Exam significantly limited secondary to painful skin rash.  However, she was found to have right pubic rami fractures.  Discussed with Dr. Linna Caprice, who requests CT pelvis.  Plan to admit for severely difficult to control pain and PT eval.        Blake Divine, MD 03/23/15 [phone]

## 2015-03-22 NOTE — ED Notes (Signed)
Dr. Wofford at bedside 

## 2015-03-22 NOTE — ED Notes (Signed)
[facility] States she fell out of wheelchair onto her rt side. C/o rt thigh pain. 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 EMS administered a total of 100 Fentanyl.

## 2015-03-23 ENCOUNTER — Encounter (HOSPITAL_COMMUNITY): Payer: Self-pay

## 2015-03-23 ENCOUNTER — Observation Stay (HOSPITAL_COMMUNITY): Payer: Medicare Other

## 2015-03-23 DIAGNOSIS — N189 Chronic kidney disease, unspecified: Secondary | ICD-10-CM | POA: Diagnosis present

## 2015-03-23 DIAGNOSIS — Q828 Other specified congenital malformations of skin: Secondary | ICD-10-CM

## 2015-03-23 DIAGNOSIS — I5032 Chronic diastolic (congestive) heart failure: Secondary | ICD-10-CM

## 2015-03-23 DIAGNOSIS — S32501A Unspecified fracture of right pubis, initial encounter for closed fracture: Secondary | ICD-10-CM

## 2015-03-23 DIAGNOSIS — S32599A Other specified fracture of unspecified pubis, initial encounter for closed fracture: Secondary | ICD-10-CM | POA: Diagnosis present

## 2015-03-23 DIAGNOSIS — E46 Unspecified protein-calorie malnutrition: Secondary | ICD-10-CM | POA: Diagnosis present

## 2015-03-23 DIAGNOSIS — J438 Other emphysema: Secondary | ICD-10-CM

## 2015-03-23 DIAGNOSIS — S32509S Unspecified fracture of unspecified pubis, sequela: Secondary | ICD-10-CM

## 2015-03-23 DIAGNOSIS — I480 Paroxysmal atrial fibrillation: Secondary | ICD-10-CM

## 2015-03-23 DIAGNOSIS — E039 Hypothyroidism, unspecified: Secondary | ICD-10-CM

## 2015-03-23 HISTORY — DX: Other specified congenital malformations of skin: Q82.8

## 2015-03-23 LAB — PROTIME-INR
INR: 3.71 — AB (ref 0.00–1.49)
INR: 1.74 — AB (ref 0.00–1.49)
PROTHROMBIN TIME: 37 s — AB (ref 11.6–15.2)
Prothrombin Time: 20.5 seconds — ABNORMAL HIGH (ref 11.6–15.2)

## 2015-03-23 LAB — CBC
HCT: 23.2 % — ABNORMAL LOW (ref 36.0–46.0)
Hemoglobin: 7.3 g/dL — ABNORMAL LOW (ref 12.0–15.0)
MCH: 30 pg (ref 26.0–34.0)
MCHC: 31.5 g/dL (ref 30.0–36.0)
MCV: 95.5 fL (ref 78.0–100.0)
PLATELETS: 187 10*3/uL (ref 150–400)
RBC: 2.43 MIL/uL — ABNORMAL LOW (ref 3.87–5.11)
RDW: 16.1 % — AB (ref 11.5–15.5)
WBC: 6.9 10*3/uL (ref 4.0–10.5)

## 2015-03-23 LAB — CBC WITH DIFFERENTIAL/PLATELET
BASOS PCT: 0 % (ref 0–1)
Basophils Absolute: 0 10*3/uL (ref 0.0–0.1)
EOS ABS: 0.1 10*3/uL (ref 0.0–0.7)
Eosinophils Relative: 1 % (ref 0–5)
HCT: 26.3 % — ABNORMAL LOW (ref 36.0–46.0)
Hemoglobin: 8.2 g/dL — ABNORMAL LOW (ref 12.0–15.0)
Lymphocytes Relative: 17 % (ref 12–46)
Lymphs Abs: 1.5 10*3/uL (ref 0.7–4.0)
MCH: 29.5 pg (ref 26.0–34.0)
MCHC: 31.2 g/dL (ref 30.0–36.0)
MCV: 94.6 fL (ref 78.0–100.0)
MONOS PCT: 4 % (ref 3–12)
Monocytes Absolute: 0.4 10*3/uL (ref 0.1–1.0)
NEUTROS PCT: 78 % — AB (ref 43–77)
Neutro Abs: 6.7 10*3/uL (ref 1.7–7.7)
PLATELETS: 189 10*3/uL (ref 150–400)
RBC: 2.78 MIL/uL — ABNORMAL LOW (ref 3.87–5.11)
RDW: 16 % — AB (ref 11.5–15.5)
WBC: 8.6 10*3/uL (ref 4.0–10.5)

## 2015-03-23 LAB — URINALYSIS, ROUTINE W REFLEX MICROSCOPIC
BILIRUBIN URINE: NEGATIVE
GLUCOSE, UA: NEGATIVE mg/dL
HGB URINE DIPSTICK: NEGATIVE
KETONES UR: NEGATIVE mg/dL
Leukocytes, UA: NEGATIVE
Nitrite: NEGATIVE
PH: 5 (ref 5.0–8.0)
Protein, ur: NEGATIVE mg/dL
Specific Gravity, Urine: 1.012 (ref 1.005–1.030)
Urobilinogen, UA: 0.2 mg/dL (ref 0.0–1.0)

## 2015-03-23 LAB — I-STAT CHEM 8, ED
BUN: 38 mg/dL — ABNORMAL HIGH (ref 6–23)
CHLORIDE: 101 mmol/L (ref 96–112)
Calcium, Ion: 1.1 mmol/L — ABNORMAL LOW (ref 1.13–1.30)
Creatinine, Ser: 1.3 mg/dL — ABNORMAL HIGH (ref 0.50–1.10)
Glucose, Bld: 104 mg/dL — ABNORMAL HIGH (ref 70–99)
HCT: 26 % — ABNORMAL LOW (ref 36.0–46.0)
HEMOGLOBIN: 8.8 g/dL — AB (ref 12.0–15.0)
POTASSIUM: 4 mmol/L (ref 3.5–5.1)
SODIUM: 136 mmol/L (ref 135–145)
TCO2: 22 mmol/L (ref 0–100)

## 2015-03-23 LAB — COMPREHENSIVE METABOLIC PANEL
ALBUMIN: 1.8 g/dL — AB (ref 3.5–5.2)
ALT: 16 U/L (ref 0–35)
AST: 24 U/L (ref 0–37)
Alkaline Phosphatase: 84 U/L (ref 39–117)
Anion gap: 10 (ref 5–15)
BUN: 41 mg/dL — ABNORMAL HIGH (ref 6–23)
CALCIUM: 7.8 mg/dL — AB (ref 8.4–10.5)
CO2: 23 mmol/L (ref 19–32)
Chloride: 103 mmol/L (ref 96–112)
Creatinine, Ser: 1.28 mg/dL — ABNORMAL HIGH (ref 0.50–1.10)
GFR calc non Af Amer: 39 mL/min — ABNORMAL LOW (ref >90)
GFR, EST AFRICAN AMERICAN: 45 mL/min — AB (ref >90)
GLUCOSE: 103 mg/dL — AB (ref 70–99)
Potassium: 4.2 mmol/L (ref 3.5–5.1)
Sodium: 136 mmol/L (ref 135–145)
Total Bilirubin: 0.5 mg/dL (ref 0.3–1.2)
Total Protein: 4.8 g/dL — ABNORMAL LOW (ref 6.0–8.3)

## 2015-03-23 LAB — MRSA PCR SCREENING: MRSA by PCR: POSITIVE — AB

## 2015-03-23 LAB — APTT
APTT: 56 s — AB (ref 24–37)
APTT: 59 s — AB (ref 24–37)

## 2015-03-23 LAB — CK: Total CK: 92 U/L (ref 7–177)

## 2015-03-23 MED ORDER — POLYETHYLENE GLYCOL 3350 17 G PO PACK
17.0000 g | PACK | Freq: Every day | ORAL | Status: DC | PRN
  Filled 2015-03-23: qty 1

## 2015-03-23 MED ORDER — LEVOTHYROXINE SODIUM 100 MCG PO TABS
100.0000 ug | ORAL_TABLET | Freq: Every day | ORAL | Status: DC
  Administered 2015-03-23 – 2015-03-25 (×3): 100 ug via ORAL
  Filled 2015-03-23 (×5): qty 1

## 2015-03-23 MED ORDER — METOPROLOL TARTRATE 50 MG PO TABS
50.0000 mg | ORAL_TABLET | Freq: Two times a day (BID) | ORAL | Status: DC
  Administered 2015-03-23 – 2015-03-25 (×5): 50 mg via ORAL
  Filled 2015-03-23 (×6): qty 1

## 2015-03-23 MED ORDER — HYDROCODONE-ACETAMINOPHEN 7.5-325 MG PO TABS
1.0000 | ORAL_TABLET | ORAL | Status: DC | PRN
  Administered 2015-03-23 – 2015-03-25 (×12): 1 via ORAL
  Filled 2015-03-23 (×12): qty 1

## 2015-03-23 MED ORDER — MORPHINE SULFATE 2 MG/ML IJ SOLN
2.0000 mg | INTRAMUSCULAR | Status: DC | PRN
  Administered 2015-03-23 – 2015-03-25 (×4): 2 mg via INTRAVENOUS
  Filled 2015-03-23 (×4): qty 1

## 2015-03-23 MED ORDER — FOLIC ACID 1 MG PO TABS
1.0000 mg | ORAL_TABLET | Freq: Every day | ORAL | Status: DC
  Administered 2015-03-23 – 2015-03-25 (×3): 1 mg via ORAL
  Filled 2015-03-23 (×4): qty 1

## 2015-03-23 MED ORDER — ATORVASTATIN CALCIUM 10 MG PO TABS
20.0000 mg | ORAL_TABLET | Freq: Every day | ORAL | Status: DC
  Administered 2015-03-23 – 2015-03-25 (×3): 20 mg via ORAL
  Filled 2015-03-23: qty 1
  Filled 2015-03-23 (×2): qty 2

## 2015-03-23 MED ORDER — MINOCYCLINE HCL 100 MG PO CAPS
100.0000 mg | ORAL_CAPSULE | Freq: Two times a day (BID) | ORAL | Status: DC
  Administered 2015-03-23 – 2015-03-25 (×5): 100 mg via ORAL
  Filled 2015-03-23 (×7): qty 1

## 2015-03-23 MED ORDER — LORATADINE 10 MG PO TABS
10.0000 mg | ORAL_TABLET | Freq: Every day | ORAL | Status: DC
  Administered 2015-03-23 – 2015-03-25 (×3): 10 mg via ORAL
  Filled 2015-03-23 (×3): qty 1

## 2015-03-23 MED ORDER — CITALOPRAM HYDROBROMIDE 20 MG PO TABS
20.0000 mg | ORAL_TABLET | Freq: Every day | ORAL | Status: DC
  Administered 2015-03-23 – 2015-03-25 (×3): 20 mg via ORAL
  Filled 2015-03-23 (×3): qty 1

## 2015-03-23 MED ORDER — DILTIAZEM HCL ER COATED BEADS 120 MG PO CP24
120.0000 mg | ORAL_CAPSULE | Freq: Two times a day (BID) | ORAL | Status: DC
  Administered 2015-03-23 – 2015-03-25 (×5): 120 mg via ORAL
  Filled 2015-03-23 (×7): qty 1

## 2015-03-23 MED ORDER — TRAZODONE HCL 50 MG PO TABS
150.0000 mg | ORAL_TABLET | Freq: Every day | ORAL | Status: DC
  Administered 2015-03-23 – 2015-03-24 (×2): 150 mg via ORAL
  Filled 2015-03-23 (×5): qty 1

## 2015-03-23 MED ORDER — HEPARIN (PORCINE) IN NACL 100-0.45 UNIT/ML-% IJ SOLN
1100.0000 [IU]/h | INTRAMUSCULAR | Status: DC
  Administered 2015-03-23: 1100 [IU]/h via INTRAVENOUS
  Filled 2015-03-23: qty 250

## 2015-03-23 MED ORDER — CIPROFLOXACIN HCL 500 MG PO TABS
500.0000 mg | ORAL_TABLET | Freq: Two times a day (BID) | ORAL | Status: DC
Start: 2015-03-23 — End: 2015-03-26
  Administered 2015-03-23 – 2015-03-25 (×5): 500 mg via ORAL
  Filled 2015-03-23 (×7): qty 1

## 2015-03-23 MED ORDER — LORAZEPAM 0.5 MG PO TABS
0.5000 mg | ORAL_TABLET | Freq: Two times a day (BID) | ORAL | Status: DC | PRN
  Administered 2015-03-23 – 2015-03-25 (×2): 0.5 mg via ORAL
  Filled 2015-03-23 (×2): qty 1

## 2015-03-23 MED ORDER — MORPHINE SULFATE 2 MG/ML IJ SOLN: 2.0000 mg | Freq: Once | INTRAMUSCULAR | Status: DC

## 2015-03-23 MED ORDER — DIPHENHYDRAMINE HCL 25 MG PO CAPS: 25.0000 mg | ORAL_CAPSULE | Freq: Three times a day (TID) | ORAL | Status: DC | PRN

## 2015-03-23 MED ORDER — FUROSEMIDE 20 MG PO TABS
20.0000 mg | ORAL_TABLET | Freq: Every day | ORAL | Status: DC
Start: 2015-03-23 — End: 2015-03-26
  Administered 2015-03-23 – 2015-03-25 (×3): 20 mg via ORAL
  Filled 2015-03-23 (×3): qty 1

## 2015-03-23 MED ORDER — LEVALBUTEROL HCL 0.63 MG/3ML IN NEBU
0.6300 mg | INHALATION_SOLUTION | Freq: Four times a day (QID) | RESPIRATORY_TRACT | Status: DC | PRN
  Filled 2015-03-23: qty 3

## 2015-03-23 MED ORDER — DEXTROSE 5 % IV SOLN
500.0000 mg | Freq: Four times a day (QID) | INTRAVENOUS | Status: DC | PRN
  Filled 2015-03-23: qty 5

## 2015-03-23 MED ORDER — METHOCARBAMOL 500 MG PO TABS
500.0000 mg | ORAL_TABLET | Freq: Four times a day (QID) | ORAL | Status: DC | PRN
  Administered 2015-03-24 – 2015-03-25 (×3): 500 mg via ORAL
  Filled 2015-03-23 (×4): qty 1

## 2015-03-23 NOTE — Progress Notes (Deleted)
Paged MD and request medicine orders for nausea.

## 2015-03-23 NOTE — H&P (Addendum)
[facility] History and Physical  Patient: Sierra Singleton  MRN: [id number]  DOB: 1933-09-22  DOS: the patient was seen and examined on 03/23/2015 PCP: Ignatius Specking., MD  Referring physician: Dr. Loretha Stapler Chief Complaint: Fall  HPI: Sierra Singleton is a 81 y.o. female with Past medical history of hypothyroidism, essential hypertension, CHF, atrial fibrillation on anticoagulation, chronic kidney disease, chronic respiratory failure, chronic diastolic heart failure. The patient is presenting with a fall. The patient is a resident at assisted living facility and was coming out of her wheelchair and suddenly lost her balance and fell down. She hit her head. At the time of my evaluation she denies any headache or neck pain. She denies any blurring of the vision speech difficulty. She denies any focal deficit. She denies any chest pain dizziness palpitation during the day. She denies any cough or shortness of breath. She denies any nausea at the time of my evaluation but mentions cardiac she had some nausea. No vomiting. No abdominal pain. She has chronic maculopapular rash which is causing her significant pain which is unchanged. She denies any burning urination. After the fall she had significant pain on her right side and was brought to the hospital. No recent change in her medications reported by patient although nursing home list mentions Cipro and minocycline which she thinks might be for skin rash.  The patient is coming from ALF. And at her baseline independent for most of her ADL.  Review of Systems: as mentioned in the history of present illness.  A comprehensive review of the other systems is negative.  Past Medical History  Diagnosis Date  . Rash     [name] rash  . Anxiety   . Thyroid disease   . Hyperlipidemia   . Hypertension   . COPD (chronic obstructive pulmonary disease)   . Poor historian   . Carotid artery disease   . Atrial fibrillation 02/11/2015  .  Chronic respiratory failure with hypoxia 02/11/2015   Past Surgical History  Procedure Laterality Date  . Back surgery     Social History:  reports that she has never smoked. She does not have any smokeless tobacco history on file. She reports that she does not drink alcohol or use illicit drugs.  Allergies  Allergen Reactions  . Ambien [Zolpidem Tartrate]   . Sulfa Antibiotics Nausea And Vomiting  . Clindamycin/Lincomycin Swelling and Rash    Family History  Problem Relation Age of Onset  . Cancer Father   . Stroke Mother     Prior to Admission medications   Medication Sig Start Date End Date Taking? Authorizing Provider  atorvastatin (LIPITOR) 20 MG tablet Take 20 mg by mouth daily.   Yes Historical Provider, MD  cetirizine (ZYRTEC) 10 MG tablet Take 10 mg by mouth 2 (two) times daily.    Yes Historical Provider, MD  ciprofloxacin (CIPRO) 500 MG tablet Take 500 mg by mouth 2 (two) times daily.   Yes Historical Provider, MD  citalopram (CELEXA) 20 MG tablet Take 20 mg by mouth daily.   Yes Historical Provider, MD  diltiazem (CARDIZEM CD) 120 MG 24 hr capsule Take 1 capsule (120 mg total) by mouth 2 (two) times daily. 02/17/15  Yes Erick Blinks, MD  diphenhydrAMINE (BENADRYL) 25 mg capsule Take 25 mg by mouth every 8 (eight) hours as needed.   Yes Historical Provider, MD  folic acid (FOLVITE) 1 MG tablet Take 1 mg by mouth daily.   Yes Historical Provider, MD  furosemide (LASIX) 20 MG tablet Take 20 mg by mouth every other day. Alternate with 40 mg   Yes Historical Provider, MD  furosemide (LASIX) 40 MG tablet Alternate 20mg  and 40mg  daily. Start on 3/31 Patient taking differently: Take 40 mg by mouth every other day. Alternate with 20 mg 02/20/15  Yes Erick Blinks, MD  guaiFENesin-dextromethorphan (ROBITUSSIN DM) 100-10 MG/5ML syrup Take 15 mLs by mouth every 4 (four) hours as needed for cough.   Yes Historical Provider, MD  HYDROcodone-acetaminophen (NORCO) 7.5-325 MG per tablet  Take 1 tablet by mouth every 6 (six) hours as needed for moderate pain. Patient taking differently: Take 1 tablet by mouth every 4 (four) hours as needed for moderate pain.  02/17/15  Yes Erick Blinks, MD  HYDROcodone-acetaminophen (NORCO) 7.5-325 MG per tablet Take 1 tablet by mouth every 4 (four) hours. While awake   Yes Historical Provider, MD  levalbuterol (XOPENEX) 0.63 MG/3ML nebulizer solution Take 3 mLs (0.63 mg total) by nebulization every 6 (six) hours as needed for wheezing or shortness of breath. 02/17/15  Yes Erick Blinks, MD  levothyroxine (SYNTHROID, LEVOTHROID) 100 MCG tablet Take 100 mcg by mouth daily before breakfast.   Yes Historical Provider, MD  LORazepam (ATIVAN) 0.5 MG tablet Take 0.5 mg by mouth 2 (two) times daily as needed for anxiety.   Yes Historical Provider, MD  methotrexate 2.5 MG tablet Take 10 mg by mouth once a week. On Sunday.   Yes Historical Provider, MD  metoprolol (LOPRESSOR) 50 MG tablet Take 1 tablet (50 mg total) by mouth 2 (two) times daily. 02/17/15  Yes Erick Blinks, MD  minocycline (MINOCIN,DYNACIN) 100 MG capsule Take 100 mg by mouth 2 (two) times daily.   Yes Historical Provider, MD  Rivaroxaban (XARELTO) 15 MG TABS tablet Take 1 tablet (15 mg total) by mouth every evening. 02/17/15  Yes Erick Blinks, MD  traZODone (DESYREL) 150 MG tablet Take 150 mg by mouth at bedtime.    Yes Historical Provider, MD    Physical Exam: Filed Vitals:   03/22/15 2300 03/22/15 2309 03/23/15 0030 03/23/15 0157  BP: 114/62  108/62 120/64  Pulse: 84 89 89 97  Temp:    98.9 F (37.2 C)  TempSrc:    Oral  Resp:  25 21 23   SpO2: 100% 100% 100% 99%    General: Alert, Awake and Oriented to Time, Place and Person. Appear in moderate distress Eyes: PERRL ENT: Oral Mucosa clear moist. Neck: no JVD Cardiovascular: S1 and S2 Present, no Murmur, Peripheral Pulses Present Respiratory: Bilateral Air entry equal and Decreased,  Clear to Auscultation, no Crackles, no  wheezes Abdomen: Bowel Sound present, Soft and non tender Skin: Diffuse macular papular on the abdomen and back Rash Extremities: Bilateral Pedal edema, no calf tenderness Neurologic: Grossly no focal neuro deficit.  Labs on Admission:  CBC:  Recent Labs Lab 03/23/15 0028 03/23/15 0100  WBC  --  8.6  NEUTROABS  --  6.7  HGB 8.8* 8.2*  HCT 26.0* 26.3*  MCV  --  94.6  PLT  --  189    CMP     Component Value Date/Time   NA 136 03/23/2015 0100   K 4.2 03/23/2015 0100   CL 103 03/23/2015 0100   CO2 23 03/23/2015 0100   GLUCOSE 103* 03/23/2015 0100   BUN 41* 03/23/2015 0100   CREATININE 1.28* 03/23/2015 0100   CALCIUM 7.8* 03/23/2015 0100   PROT 4.8* 03/23/2015 0100   ALBUMIN 1.8* 03/23/2015 0100  AST 24 03/23/2015 0100   ALT 16 03/23/2015 0100   ALKPHOS 84 03/23/2015 0100   BILITOT 0.5 03/23/2015 0100   GFRNONAA 39* 03/23/2015 0100   GFRAA 45* 03/23/2015 0100    No results for input(s): LIPASE, AMYLASE in the last 168 hours.   Recent Labs Lab 03/23/15 0100  CKTOTAL 92   BNP (last 3 results)  Recent Labs  12/29/14 1258 02/09/15 1030  BNP 593.0* 676.0*    ProBNP (last 3 results) No results for input(s): PROBNP in the last 8760 hours.   Radiological Exams on Admission: Ct Head Wo Contrast  03/22/2015   CLINICAL DATA:  81 year old female status post fall out of wheelchair  EXAM: CT HEAD WITHOUT CONTRAST  CT CERVICAL SPINE WITHOUT CONTRAST  TECHNIQUE: Multidetector CT imaging of the head and cervical spine was performed following the standard protocol without intravenous contrast. Multiplanar CT image reconstructions of the cervical spine were also generated.  COMPARISON:  Prior CT scan of the head and cervical spine 05/08/2009  FINDINGS: CT HEAD FINDINGS  Negative for acute intracranial hemorrhage, acute infarction, mass, mass effect, hydrocephalus or midline shift. Gray-white differentiation is preserved throughout. Small right occipital scalp contusion. No  evidence of underlying calvarial fracture. Globes and orbits are symmetric bilaterally. Normal aeration of the mastoid air cells and the visualized paranasal sinuses.  CT CERVICAL SPINE FINDINGS  No acute fracture, malalignment or prevertebral soft tissue swelling. Exaggerated thoracic kyphosis. Mild multilevel cervical spondylosis without focality. No acute soft tissue abnormality. Biapical pulmonary emphysema.  IMPRESSION: CT HEAD  1. No acute intracranial abnormality. 2. Small right occipital scalp contusion. CT CSPINE  1. No acute fracture or malalignment. 2. Pulmonary emphysema. 3. Exaggerated thoracic kyphosis.   Electronically Signed   By: Malachy MoanHeath  McCullough M.D.   On: 03/22/2015 22:04   Ct Cervical Spine Wo Contrast  03/22/2015   CLINICAL DATA:  81 year old female status post fall out of wheelchair  EXAM: CT HEAD WITHOUT CONTRAST  CT CERVICAL SPINE WITHOUT CONTRAST  TECHNIQUE: Multidetector CT imaging of the head and cervical spine was performed following the standard protocol without intravenous contrast. Multiplanar CT image reconstructions of the cervical spine were also generated.  COMPARISON:  Prior CT scan of the head and cervical spine 05/08/2009  FINDINGS: CT HEAD FINDINGS  Negative for acute intracranial hemorrhage, acute infarction, mass, mass effect, hydrocephalus or midline shift. Gray-white differentiation is preserved throughout. Small right occipital scalp contusion. No evidence of underlying calvarial fracture. Globes and orbits are symmetric bilaterally. Normal aeration of the mastoid air cells and the visualized paranasal sinuses.  CT CERVICAL SPINE FINDINGS  No acute fracture, malalignment or prevertebral soft tissue swelling. Exaggerated thoracic kyphosis. Mild multilevel cervical spondylosis without focality. No acute soft tissue abnormality. Biapical pulmonary emphysema.  IMPRESSION: CT HEAD  1. No acute intracranial abnormality. 2. Small right occipital scalp contusion. CT CSPINE  1.  No acute fracture or malalignment. 2. Pulmonary emphysema. 3. Exaggerated thoracic kyphosis.   Electronically Signed   By: Malachy MoanHeath  McCullough M.D.   On: 03/22/2015 22:04   Ct Pelvis Wo Contrast  03/23/2015   CLINICAL DATA:  81 year old female with right pelvic fractures after falling from her wheelchair  EXAM: CT PELVIS WITHOUT CONTRAST  TECHNIQUE: Multidetector CT imaging of the pelvis was performed following the standard protocol without intravenous contrast.  COMPARISON:  Pelvic and right hip radiographs 03/22/2015  FINDINGS: Visualized intra of pelvic contents remarkable for sigmoid diverticulosis without evidence of active diverticulitis and moderate rectal stool burden. Normal  appendix noted in the right lower quadrant. Unremarkable uterus and bladder. No free fluid or suspicious adenopathy.  Diffuse atherosclerotic vascular calcifications.  Nondisplaced fracture through the anterior aspect of the right sacral ala at S4. Comminuted but minimally displaced fracture through the superior pubic ramus on the right. Additionally, there is a mildly displaced fracture through the inferior pubic ramus. The acetabulum is intact. The right femur is intact. The left pelvis and femur are intact. Mild hematoma surrounding the superior and inferior pubic rami fractures. No joint effusion. No significant hematoma about sacral fracture.  IMPRESSION: 1. Mildly displaced acute fractures of the parasymphyseal superior pubic ramus and mid inferior pubic ramus as seen on the prior radiograph. Mild peri-fracture hematoma. 2. Nondisplaced fracture through the anterior aspect of the right S4 sacral ala. 3. Colonic diverticular disease without CT evidence of active inflammation. 4. Atherosclerotic vascular calcifications.   Electronically Signed   By: Malachy Moan M.D.   On: 03/23/2015 01:23   Dg Hip Unilat With Pelvis 2-3 Views Left  03/22/2015   CLINICAL DATA:  81 year old female with right leg pain after fall  EXAM: LEFT  HIP (WITH PELVIS) 2-3 VIEWS  COMPARISON:  Prior pelvic and left hip radiographs 02/14/2015  FINDINGS: Acute minimally displaced fractures of the right superior and inferior pubic rami. The right proximal femur remains intact. The bones appear osteopenic. Mild atherosclerotic vascular calcifications.  IMPRESSION: 1. Minimally displaced fractures of the right superior and inferior pubic rami.   Electronically Signed   By: Malachy Moan M.D.   On: 03/22/2015 22:05    Assessment/Plan Principal Problem:   Fracture of multiple pubic rami Active Problems:   COPD (chronic obstructive pulmonary disease)   Hypothyroidism   Atrial fibrillation   Chronic diastolic CHF (congestive heart failure)   Protein-calorie malnutrition   CKD (chronic kidney disease)   [name]   1. Fracture of multiple pubic rami The patient is presenting with a mechanical fall. She had significant right-sided pain. X-ray as well as CT scan of the pelvis is positive for pubic rami fractures. She does not have any hip fracture. She was evaluated by orthopedics recommend conservative management. At present we will use morphine and when necessary Robaxin as well as Norco for pain management. PTOT will be consulted in the morning. CT scan also shows there is mild hematoma. Patient is in excess of tenderness most likely secondary to hematoma. We will hold Xarelto I'll place her on heparin per pharmacy.  2. chronic diastolic heart failure. Patient is currently 20 mg and 40 mg Lasix automatically. We give her 20 mg at present daily.  3. atrial fibrillation. Continuing rate control medications.  4. chronic kidney disease. Serum creatinine fluctuating significantly. Continue close monitoring at present.  5. chronic rash. 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 Continue conservative management. Will continue cipro and minocycline per home dose.  Advance goals of care discussion: DNR     Consults: ED physician discussed with orthopedics .  DVT Prophylaxis: on chronic therapeutic anticoagulation. Nutrition: regular diet  Disposition: Admitted as observation, med-surge unit.  Author: Lynden Oxford, MD [facility] Pager: [phone] 03/23/2015  If 7PM-7AM, please contact night-coverage [email or web address] Password [email or web address]

## 2015-03-23 NOTE — Progress Notes (Signed)
ANTICOAGULATION CONSULT NOTE - Initial Consult  Pharmacy Consult for Heparin Indication: atrial fibrillation  Allergies  Allergen Reactions  . Ambien [Zolpidem Tartrate]   . Sulfa Antibiotics Nausea And Vomiting  . Clindamycin/Lincomycin Swelling and Rash    Patient Measurements: Weight 77.6 kg on 03/01/15 Height 163 cm on 03/01/15 IBW 55.16 kg Heparin Dosing Weight: 71.5 kg  Vital Signs: Temp: 98.7 F (37.1 C) (04/28 2040) Temp Source: Oral (04/28 1743) BP: 143/36 mmHg (04/28 2040) Pulse Rate: 74 (04/28 2040)  Labs:  Recent Labs  03/23/15 0028 03/23/15 0100  HGB 8.8* 8.2*  HCT 26.0* 26.3*  PLT  --  189  APTT  --  56*  LABPROT  --  37.0*  INR  --  3.71*  CREATININE 1.30* 1.28*  CKTOTAL  --  92    CrCl cannot be calculated (Unknown ideal weight.).   Medical History: Past Medical History  Diagnosis Date  . Rash     Zebedee IbaHaley Haley rash  . Anxiety   . Thyroid disease   . Hyperlipidemia   . Hypertension   . COPD (chronic obstructive pulmonary disease)   . Poor historian   . Carotid artery disease   . Atrial fibrillation 02/11/2015  . Chronic respiratory failure with hypoxia 02/11/2015  . 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 Allena KatzPatel.   Last Xarelto dose was on 03/22/15 PM - we are now 24 hours outside of this dose. INR is falsely elevated at 3.71 in the setting of Xarelto. Due to recent Xarelto and ability to elevate heparin levels as well we will monitor aPTTs.   Goal of Therapy:  Heparin level 0.3-0.7 units/ml Monitor platelets by anticoagulation protocol: Yes   Plan:  Baseline Heparin level and aPTT to see Xarelto effects. Start Heparin WITHOUT bolus at rate of 1100 units/hr. APTT and heparin level in 8 hours.  Daily APTT and heparin levels  until correlating -then use heparin level alone while on therapy.   Link SnufferJessica Madge Therrien, PharmD, BCPS Clinical Pharmacist [phone] 03/23/2015,9:02 PM

## 2015-03-23 NOTE — ED Notes (Signed)
Notified pts family member Laurence SlateBarry Glosen regarding pts room number upstairs. Laurence SlateBarry Glosen [phone]

## 2015-03-23 NOTE — ED Notes (Signed)
Pt declined pain medicine at this time

## 2015-03-23 NOTE — ED Notes (Signed)
Attempted report 

## 2015-03-23 NOTE — Clinical Social Work Note (Addendum)
CSW attempted to assess patient. RN providing patient care at this time, CSW will attempt to assess at a later time. Patient is going to require placement at discharge, so CSW will go ahead and complete patient's FL2.  UPDATE: Patient has transferred to 5N, CSW will provide unit CSW with handoff in the morning.  Roddie McBryant Mikayla Chiusano MSW, [place]ney[place]CSWA, [place], [phone]

## 2015-03-23 NOTE — Progress Notes (Signed)
On assess, numerous open areas to groin, abdominal fold, under breast, across ABD, and entire back.  Cleansed with NSS and covered with pads until treatment orders placed.   Will continue to monitor.

## 2015-03-23 NOTE — Progress Notes (Signed)
Patient seen and evaluated earlier this am by my associate. Please refer to H and P for details regarding assessment and plan.  Gen: pt in nad, alert and awake CV: no cyanosis or edema Pulm: no increased wob, no wheezes  Will reassess next am  Schneider Warchol, Pamala HurryLANDO

## 2015-03-23 NOTE — Progress Notes (Signed)
Pt has arrived to the unit.  

## 2015-03-23 NOTE — Consult Note (Signed)
Sierra Singleton is an 81 y.o. female.    Chief Complaint: right lower leg/pelvis pain  HPI: 81 y/o female with report of ground level fall earlier today c/o pelvis pain and right lower leg pain. Pain with trying to bear weight. Denies any LOC or injury to bilateral upper or left extremity. Denies any other issues or symptoms. Denies any numbness or tingling distally  PCP:  VYAS,DHRUV B., MD  PMH: Past Medical History  Diagnosis Date  . Rash     [name] rash  . Anxiety   . Thyroid disease   . Hyperlipidemia   . Hypertension   . COPD (chronic obstructive pulmonary disease)   . Poor historian   . Carotid artery disease   . Atrial fibrillation 02/11/2015  . Chronic respiratory failure with hypoxia 02/11/2015    PSH: Past Surgical History  Procedure Laterality Date  . Back surgery      Social History:  reports that she has never smoked. She does not have any smokeless tobacco history on file. She reports that she does not drink alcohol or use illicit drugs.  Allergies:  Allergies  Allergen Reactions  . Ambien [Zolpidem Tartrate]   . Sulfa Antibiotics Nausea And Vomiting  . Clindamycin/Lincomycin Swelling and Rash    Medications: Current Facility-Administered Medications  Medication Dose Route Frequency Provider Last Rate Last Dose  . morphine 2 MG/ML injection 2 mg  2 mg Intravenous Once Blake Divine, MD       Current Outpatient Prescriptions  Medication Sig Dispense Refill  . atorvastatin (LIPITOR) 20 MG tablet Take 20 mg by mouth daily.    . cetirizine (ZYRTEC) 10 MG tablet Take 10 mg by mouth 2 (two) times daily.     . ciprofloxacin (CIPRO) 500 MG tablet Take 500 mg by mouth 2 (two) times daily.    . citalopram (CELEXA) 20 MG tablet Take 20 mg by mouth daily.    Marland Kitchen diltiazem (CARDIZEM CD) 120 MG 24 hr capsule Take 1 capsule (120 mg total) by mouth 2 (two) times daily.    . diphenhydrAMINE (BENADRYL) 25 mg capsule Take 25 mg by mouth every 8 (eight) hours as  needed.    . folic acid (FOLVITE) 1 MG tablet Take 1 mg by mouth daily.    . furosemide (LASIX) 20 MG tablet Take 20 mg by mouth every other day. Alternate with 40 mg    . furosemide (LASIX) 40 MG tablet Alternate  and  daily. Start on 3/31 (Patient taking differently: Take 40 mg by mouth every other day. Alternate with 20 mg) 30 tablet 1  . guaiFENesin-dextromethorphan (ROBITUSSIN DM) 100-10 MG/5ML syrup Take 15 mLs by mouth every 4 (four) hours as needed for cough.    Marland Kitchen HYDROcodone-acetaminophen (NORCO) 7.5-325 MG per tablet Take 1 tablet by mouth every 6 (six) hours as needed for moderate pain. (Patient taking differently: Take 1 tablet by mouth every 4 (four) hours as needed for moderate pain. ) 30 tablet 0  . HYDROcodone-acetaminophen (NORCO) 7.5-325 MG per tablet Take 1 tablet by mouth every 4 (four) hours. While awake    . levalbuterol (XOPENEX) 0.63 MG/3ML nebulizer solution Take 3 mLs (0.63 mg total) by nebulization every 6 (six) hours as needed for wheezing or shortness of breath. 3 mL 12  . levothyroxine (SYNTHROID, LEVOTHROID) 100 MCG tablet Take 100 mcg by mouth daily before breakfast.    . LORazepam (ATIVAN) 0.5 MG tablet Take 0.5 mg by mouth 2 (two) times daily as needed  for anxiety.    . methotrexate 2.5 MG tablet Take 10 mg by mouth once a week. On Sunday.    . metoprolol (LOPRESSOR) 50 MG tablet Take 1 tablet (50 mg total) by mouth 2 (two) times daily.    . minocycline (MINOCIN,DYNACIN) 100 MG capsule Take 100 mg by mouth 2 (two) times daily.    . Rivaroxaban (XARELTO) 15 MG TABS tablet Take 1 tablet (15 mg total) by mouth every evening. 30 tablet   . traZODone (DESYREL) 150 MG tablet Take 150 mg by mouth at bedtime.       Results for orders placed or performed during the hospital encounter of 03/22/15 (from the past 48 hour(s))  I-Stat Chem 8, ED     Status: Abnormal   Collection Time: 03/23/15 12:28 AM  Result Value Ref Range   Sodium 136 135 - 145 mmol/L   Potassium  4.0 3.5 - 5.1 mmol/L   Chloride 101 96 - 112 mmol/L   BUN 38 (H) 6 - 23 mg/dL   Creatinine, Ser 1.61 (H) 0.50 - 1.10 mg/dL   Glucose, Bld 096 (H) 70 - 99 mg/dL   Calcium, Ion 0.45 (L) 1.13 - 1.30 mmol/L   TCO2 22 0 - 100 mmol/L   Hemoglobin 8.8 (L) 12.0 - 15.0 g/dL   HCT 40.9 (L) 81.1 - 91.4 %   Ct Head Wo Contrast  03/22/2015   CLINICAL DATA:  81 year old female status post fall out of wheelchair  EXAM: CT HEAD WITHOUT CONTRAST  CT CERVICAL SPINE WITHOUT CONTRAST  TECHNIQUE: Multidetector CT imaging of the head and cervical spine was performed following the standard protocol without intravenous contrast. Multiplanar CT image reconstructions of the cervical spine were also generated.  COMPARISON:  Prior CT scan of the head and cervical spine 05/08/2009  FINDINGS: CT HEAD FINDINGS  Negative for acute intracranial hemorrhage, acute infarction, mass, mass effect, hydrocephalus or midline shift. Gray-white differentiation is preserved throughout. Small right occipital scalp contusion. No evidence of underlying calvarial fracture. Globes and orbits are symmetric bilaterally. Normal aeration of the mastoid air cells and the visualized paranasal sinuses.  CT CERVICAL SPINE FINDINGS  No acute fracture, malalignment or prevertebral soft tissue swelling. Exaggerated thoracic kyphosis. Mild multilevel cervical spondylosis without focality. No acute soft tissue abnormality. Biapical pulmonary emphysema.  IMPRESSION: CT HEAD  1. No acute intracranial abnormality. 2. Small right occipital scalp contusion. CT CSPINE  1. No acute fracture or malalignment. 2. Pulmonary emphysema. 3. Exaggerated thoracic kyphosis.   Electronically Signed   By: Malachy Moan M.D.   On: 03/22/2015 22:04   Ct Cervical Spine Wo Contrast  03/22/2015   CLINICAL DATA:  81 year old female status post fall out of wheelchair  EXAM: CT HEAD WITHOUT CONTRAST  CT CERVICAL SPINE WITHOUT CONTRAST  TECHNIQUE: Multidetector CT imaging of the head  and cervical spine was performed following the standard protocol without intravenous contrast. Multiplanar CT image reconstructions of the cervical spine were also generated.  COMPARISON:  Prior CT scan of the head and cervical spine 05/08/2009  FINDINGS: CT HEAD FINDINGS  Negative for acute intracranial hemorrhage, acute infarction, mass, mass effect, hydrocephalus or midline shift. Gray-white differentiation is preserved throughout. Small right occipital scalp contusion. No evidence of underlying calvarial fracture. Globes and orbits are symmetric bilaterally. Normal aeration of the mastoid air cells and the visualized paranasal sinuses.  CT CERVICAL SPINE FINDINGS  No acute fracture, malalignment or prevertebral soft tissue swelling. Exaggerated thoracic kyphosis. Mild multilevel cervical spondylosis without focality.  No acute soft tissue abnormality. Biapical pulmonary emphysema.  IMPRESSION: CT HEAD  1. No acute intracranial abnormality. 2. Small right occipital scalp contusion. CT CSPINE  1. No acute fracture or malalignment. 2. Pulmonary emphysema. 3. Exaggerated thoracic kyphosis.   Electronically Signed   By: Malachy MoanHeath  McCullough M.D.   On: 03/22/2015 22:04   Dg Hip Unilat With Pelvis 2-3 Views Left  03/22/2015   CLINICAL DATA:  81 year old female with right leg pain after fall  EXAM: LEFT HIP (WITH PELVIS) 2-3 VIEWS  COMPARISON:  Prior pelvic and left hip radiographs 02/14/2015  FINDINGS: Acute minimally displaced fractures of the right superior and inferior pubic rami. The right proximal femur remains intact. The bones appear osteopenic. Mild atherosclerotic vascular calcifications.  IMPRESSION: 1. Minimally displaced fractures of the right superior and inferior pubic rami.   Electronically Signed   By: Malachy MoanHeath  McCullough M.D.   On: 03/22/2015 22:05    ROS: ROS Pain with weight bearing and moving right lower extremity  Physical Exam: 81 y/o female alert in no acute distress Bilateral upper  extremities with full rom no tenderness or deformity Lumbar spine non tender to sacrum Right lower extremity with minimal pain to right leg and hip with log roll No shortening or external rotation Left lower extremity with negative exam, full rom, no tenderness Physical Exam   Assessment/Plan Assessment: right superior and inferior pubic rami fractures  Plan: Pt does not require surgery for this injury May ambulate as tolerated with PT and rolling walker but may be quite painful Will continue to monitor her progress CT scan to r/o any other abnormality of the pelvis

## 2015-03-23 NOTE — Progress Notes (Signed)
UR completed 

## 2015-03-23 NOTE — Consult Note (Signed)
WOC case conference with WTA (wound treatment associate) today on this patient. 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  Also orders written for Interdry Ag+ antimicrobial wicking fabric to treat intertrigo under the breast and bilateral groin.   Kharis Lapenna [place] RN,CWOCN  [phone]

## 2015-03-23 NOTE — Progress Notes (Signed)
Report received from Cassandra, RN

## 2015-03-24 DIAGNOSIS — J410 Simple chronic bronchitis: Secondary | ICD-10-CM | POA: Diagnosis not present

## 2015-03-24 DIAGNOSIS — E039 Hypothyroidism, unspecified: Secondary | ICD-10-CM | POA: Diagnosis not present

## 2015-03-24 DIAGNOSIS — I482 Chronic atrial fibrillation: Secondary | ICD-10-CM

## 2015-03-24 DIAGNOSIS — J449 Chronic obstructive pulmonary disease, unspecified: Secondary | ICD-10-CM | POA: Diagnosis not present

## 2015-03-24 DIAGNOSIS — N189 Chronic kidney disease, unspecified: Secondary | ICD-10-CM | POA: Diagnosis not present

## 2015-03-24 DIAGNOSIS — S32509S Unspecified fracture of unspecified pubis, sequela: Secondary | ICD-10-CM | POA: Diagnosis not present

## 2015-03-24 DIAGNOSIS — I5032 Chronic diastolic (congestive) heart failure: Secondary | ICD-10-CM | POA: Diagnosis not present

## 2015-03-24 LAB — RETICULOCYTES
RBC.: 2.43 MIL/uL — ABNORMAL LOW (ref 3.87–5.11)
RETIC COUNT ABSOLUTE: 36.5 10*3/uL (ref 19.0–186.0)
Retic Ct Pct: 1.5 % (ref 0.4–3.1)

## 2015-03-24 LAB — CBC
HCT: 24 % — ABNORMAL LOW (ref 36.0–46.0)
HEMATOCRIT: 23.7 % — AB (ref 36.0–46.0)
HEMOGLOBIN: 7.3 g/dL — AB (ref 12.0–15.0)
Hemoglobin: 7.6 g/dL — ABNORMAL LOW (ref 12.0–15.0)
MCH: 29.3 pg (ref 26.0–34.0)
MCH: 29.9 pg (ref 26.0–34.0)
MCHC: 30.8 g/dL (ref 30.0–36.0)
MCHC: 31.7 g/dL (ref 30.0–36.0)
MCV: 95.2 fL (ref 78.0–100.0)
MCV: 94.5 fL (ref 78.0–100.0)
PLATELETS: 157 10*3/uL (ref 150–400)
Platelets: 168 10*3/uL (ref 150–400)
RBC: 2.49 MIL/uL — ABNORMAL LOW (ref 3.87–5.11)
RBC: 2.54 MIL/uL — ABNORMAL LOW (ref 3.87–5.11)
RDW: 16.4 % — ABNORMAL HIGH (ref 11.5–15.5)
RDW: 16.3 % — ABNORMAL HIGH (ref 11.5–15.5)
WBC: 6.7 10*3/uL (ref 4.0–10.5)
WBC: 6.8 10*3/uL (ref 4.0–10.5)

## 2015-03-24 LAB — HEPARIN LEVEL (UNFRACTIONATED): Heparin Unfractionated: 1.78 IU/mL — ABNORMAL HIGH (ref 0.30–0.70)

## 2015-03-24 LAB — CBC WITH DIFFERENTIAL/PLATELET
Basophils Absolute: 0 10*3/uL (ref 0.0–0.1)
Basophils Relative: 0 % (ref 0–1)
Eosinophils Absolute: 0.4 10*3/uL (ref 0.0–0.7)
Eosinophils Relative: 6 % — ABNORMAL HIGH (ref 0–5)
HCT: 23.2 % — ABNORMAL LOW (ref 36.0–46.0)
HEMOGLOBIN: 7.3 g/dL — AB (ref 12.0–15.0)
LYMPHS ABS: 1.6 10*3/uL (ref 0.7–4.0)
Lymphocytes Relative: 21 % (ref 12–46)
MCH: 30 pg (ref 26.0–34.0)
MCHC: 31.5 g/dL (ref 30.0–36.0)
MCV: 95.5 fL (ref 78.0–100.0)
Monocytes Absolute: 0.4 10*3/uL (ref 0.1–1.0)
Monocytes Relative: 5 % (ref 3–12)
NEUTROS PCT: 68 % (ref 43–77)
Neutro Abs: 5.1 10*3/uL (ref 1.7–7.7)
Platelets: 179 10*3/uL (ref 150–400)
RBC: 2.43 MIL/uL — AB (ref 3.87–5.11)
RDW: 16.3 % — ABNORMAL HIGH (ref 11.5–15.5)
WBC: 7.5 10*3/uL (ref 4.0–10.5)

## 2015-03-24 LAB — COMPREHENSIVE METABOLIC PANEL
ALBUMIN: 1.5 g/dL — AB (ref 3.5–5.2)
ALT: 14 U/L (ref 0–35)
AST: 21 U/L (ref 0–37)
Alkaline Phosphatase: 75 U/L (ref 39–117)
Anion gap: 7 (ref 5–15)
BUN: 38 mg/dL — AB (ref 6–23)
CO2: 27 mmol/L (ref 19–32)
CREATININE: 1.66 mg/dL — AB (ref 0.50–1.10)
Calcium: 7.6 mg/dL — ABNORMAL LOW (ref 8.4–10.5)
Chloride: 100 mmol/L (ref 96–112)
GFR calc Af Amer: 33 mL/min — ABNORMAL LOW (ref >90)
GFR calc non Af Amer: 28 mL/min — ABNORMAL LOW (ref >90)
Glucose, Bld: 106 mg/dL — ABNORMAL HIGH (ref 70–99)
Potassium: 4.4 mmol/L (ref 3.5–5.1)
Sodium: 134 mmol/L — ABNORMAL LOW (ref 135–145)
Total Bilirubin: 0.4 mg/dL (ref 0.3–1.2)
Total Protein: 4.3 g/dL — ABNORMAL LOW (ref 6.0–8.3)

## 2015-03-24 LAB — ABO/RH: ABO/RH(D): A POS

## 2015-03-24 LAB — TYPE AND SCREEN
ABO/RH(D): A POS
Antibody Screen: NEGATIVE

## 2015-03-24 MED ORDER — MUPIROCIN 2 % EX OINT
1.0000 "application " | TOPICAL_OINTMENT | Freq: Two times a day (BID) | CUTANEOUS | Status: DC
  Administered 2015-03-24 – 2015-03-25 (×2): 1 via NASAL
  Filled 2015-03-24: qty 22

## 2015-03-24 MED ORDER — WHITE PETROLATUM GEL
Status: AC
  Administered 2015-03-24: 16:00:00
  Filled 2015-03-24: qty 1

## 2015-03-24 MED ORDER — CHLORHEXIDINE GLUCONATE CLOTH 2 % EX PADS
6.0000 | MEDICATED_PAD | Freq: Every day | CUTANEOUS | Status: DC
  Administered 2015-03-24 – 2015-03-25 (×2): 6 via TOPICAL

## 2015-03-24 MED ORDER — FERROUS SULFATE 325 (65 FE) MG PO TABS
325.0000 mg | ORAL_TABLET | Freq: Two times a day (BID) | ORAL | Status: DC
  Administered 2015-03-24 – 2015-03-25 (×3): 325 mg via ORAL
  Filled 2015-03-24 (×3): qty 1

## 2015-03-24 NOTE — Progress Notes (Addendum)
[facility] PROGRESS NOTE  Sierra KalesShelby J Singleton  PXT:[id number]  DOB: [birth year]  DOS: 03/23/2015 PCP: Ignatius SpeckingVYAS,DHRUV B., MD  Subjective and Objective: As per admission H&P plan, due to history of receiving IV amiodarone to convert her to sinus rhythm, to cover her for therapeutic anticoagulation, IV heparin per pharmacy was ordered. Also ordered CBC, PT/PTT which was drawn at 2311. After starting the heparin H&H was resulted with a drop in hemoglobin from 8.2-7.3. Heparin was discontinued,pt received roughly 1 hour of drip. On bedside evaluation patient did not have any more pain than her baseline,  Filed Vitals:   03/23/15 2040  BP: 143/36  Pulse: 74  Temp: 98.7 F (37.1 C)  Resp: 16  Pulses were palpable bilaterally, There was no external hematoma noted.  Assessment and Plan: Repeat H&H in 4 hours. Type and screen in 4 hours. If H&H continues to drop obtain CT pelvis. Transfuse when necessary for H&H less than 7. Place on telemetry monitoring.  Author: Lynden OxfordPranav Patel, MD [facility] Pager: [phone]  If 7PM-7AM, please contact night-coverage at [email or web address], password [email or web address]

## 2015-03-24 NOTE — Clinical Social Work Placement (Signed)
   CLINICAL SOCIAL WORK PLACEMENT  NOTE  Date:  03/24/2015  Patient Details  Name: Wilnette KalesShelby J Shearman MRN: [id number] Date of Birth: [birth year]  Clinical Social Work is seeking post-discharge placement for this patient at the Skilled  Nursing Facility level of care (*CSW will initial, date and re-position this form in  chart as items are completed):  Yes   Patient/family provided with [place] Clinical Social Work Department's list of facilities offering this level of care within the geographic area requested by the patient (or if unable, by the patient's family).  Yes   Patient/family informed of their freedom to choose among providers that offer the needed level of care, that participate in Medicare, Medicaid or managed care program needed by the patient, have an available bed and are willing to accept the patient.  Yes   Patient/family informed of [place]'s ownership interest in [facility] and [facility], as well as of the fact that they are under no obligation to receive care at these facilities.  PASRR submitted to EDS on 03/24/15     PASRR number received on 03/24/15     Existing PASRR number confirmed on       FL2 transmitted to all facilities in geographic area requested by pt/family on 03/24/15     FL2 transmitted to all facilities within larger geographic area on       Patient informed that his/her managed care company has contracts with or will negotiate with certain facilities, including the following:            Patient/family informed of bed offers received.  Patient chooses bed at       Physician recommends and patient chooses bed at  (n/a)    Patient to be transferred to   on  .  Patient to be transferred to facility by       Patient family notified on   of transfer.  Name of family member notified:        PHYSICIAN Please sign FL2 (Son, Gery PrayBarry (patient is alert though not oriented))     Additional Comment:     _______________________________________________ Rondel BatonIngle, Coti Burd C, LCSW 03/24/2015, 12:21 PM

## 2015-03-24 NOTE — Clinical Social Work Note (Signed)
Clinical Social Work Assessment  Patient Details  Name: Sierra Singleton MRN: [id number] Date of Birth: Apr 13, 1933  Date of referral:  03/24/15               Reason for consult:  Facility Placement, Discharge Planning, Insurance Barriers                Permission sought to share information with:  Magazine features editor (Patient is alert though not oriented) Permission granted to share information::  Yes, Verbal Permission Granted  Name::        Agency::   [facility] SNF and SNFs in and around [place])  Relationship::     Contact Information:     Housing/Transportation Living arrangements for the past 2 months:  Skilled Nursing Facility, Single Family Home (Skilled Nursing the past 21 days- [facility]- prior to that home with husband) Source of Information:  Adult Children (Son, Company secretary) Patient Interpreter Needed:  None Criminal Activity/Legal Involvement Pertinent to Current Situation/Hospitalization:  No - Comment as needed Significant Relationships:  Adult Children, Spouse Lives with:  Spouse Do you feel safe going back to the place where you live?  No Need for family participation in patient care:  Yes (Comment) (patient alert though not oriented)  Care giving concerns:  Patient is not safe to return home with spouse.  Patient's son is the decision maker for the patient.  Son is agreeable to having patient return to [facility] though wishes to explore other options. Patient's son expresses concern regarding the need for Medicaid though the patient at this point in time, does not qualify for Medicaid due to her receiving Alimony.  Patient son is in the process of working with the court and an attorney to discontinue this to allow patient to qualify for Medicaid.  This process has just begun.  Patient will likely need SNF placement prior to this being resolved.  Family is concerned about the financial piece of placement.   Social Worker assessment / plan:  CSW  completed assessment with son, Sierra Singleton.  Of report, patient is alert though not oriented.  Patient is from [facility] where she has been for approx 21 days.  Patient fell and has numerous fractures at this time and is in need of continued SNF placement.  Son is concerned about the financial piece of placement due to patient not being qualified for Medicaid at this time and possibly needing long term care.  Son is extremely involved in patient's care and is working to resolve these issues so patient may possibly qualify for Medicaid.  Employment status:  Retired Database administrator PT Recommendations:  Skilled Nursing Facility Information / Referral to community resources:  Acute Rehab  Patient/Family's Response to care:  Son is agreeable to SNF search of [place] and [address]  Son is appreciative of CSW assistance and support.  Patient/Family's Understanding of and Emotional Response to Diagnosis, Current Treatment, and Prognosis:  Though patient's family remains concerned regarding the financial piece of placement, the family remains realistic regarding the patient's need for ongoing and possibly long term SNF care.  Emotional Assessment Appearance:  Appears stated age Attitude/Demeanor/Rapport:   (appropriate) Affect (typically observed):  Calm Orientation:  Oriented to Self Alcohol / Substance use:  Not Applicable Psych involvement (Current and /or in the community):  No (Comment)  Discharge Needs  Concerns to be addressed:  Discharge Planning Concerns, Financial / Insurance Concerns Readmission within the last 30 days:  Yes (3/17- [facility]) Current discharge risk:  Inadequate Financial Supports Barriers to Discharge:  Inadequate or no insurance (patient has used 21 days of [facility] Medicare and is now in copay days $365 per day.  patient family is in the process of applying for Medicaid.  There are barriers to [facility].  Patient [place].   )   Rondel Batonngle, Yoan Sallade C, LCSW 03/24/2015, 12:16 PM

## 2015-03-24 NOTE — Progress Notes (Signed)
PT Cancellation Note  Patient Details Name: Sierra KalesShelby J Singleton MRN: [id number] DOB: May 11, 1933   Cancelled Treatment:    Reason Eval/Treat Not Completed: Patient declined, no reason specified;Other (comment) (bedrest orders now lifted, pt. refusing)   Ferman HammingBlankenship, Akoni Parton B 03/24/2015, 12:27 PM Weldon PickingSusan Antinette Keough PT Acute Rehab Services [phone] Beeper [phone]

## 2015-03-24 NOTE — Progress Notes (Signed)
PT Cancellation Note  Patient Details Name: Sierra KalesShelby J Hyde MRN: [id number] DOB: 06-30-1933   Cancelled Treatment:    Reason Eval/Treat Not Completed: Patient not medically ready;Other (comment) (pt. Also  has "bedrest until discontinued" orders per Dr. Allena KatzPatel)  I discussed with Grenada[place]ittany, RN and she will attempt to contact Dr. Allena KatzPatel for clarification.  I will leave a physician sticky note.     Ferman HammingBlankenship, Skeeter Sheard B 03/24/2015, 11:33 AM Weldon PickingSusan Smith Potenza PT Acute Rehab Services [phone] Beeper [phone]

## 2015-03-24 NOTE — Clinical Social Work Note (Signed)
[facility] per son's [facility].  Weekend CSW to follow up with bed offers.  Son wishes to tour facilities, if possible, over the weekend.  Vickii PennaGina Hanin [place], LCSW [phone]  Psychiatric & Orthopedics (5N 1-8) Clinical Social Worker

## 2015-03-24 NOTE — Progress Notes (Signed)
[facility] PROGRESS NOTE  Wilnette KalesShelby J Bjorn WUJ:[id number] DOB: [birth year] DOA: 03/22/2015 PCP: Ignatius SpeckingVYAS,DHRUV B., MD  Assessment/Plan: Principal Problem:   Fracture of multiple pubic rami - treat supportively - Question is is whether this is true pitting to worsening anemia after patient was given heparin. Heparin currently held - Physical therapy to work with patient - Orthopedic surgeon initially consulted who recommended the following:  Active Problems:   COPD (chronic obstructive pulmonary disease) - Compensated currently, continue Xopenex    Hypothyroidism - Stable continue Synthroid    Atrial fibrillation - Unable to anticoagulate secondary to worsening anemia with last hemoglobin is 7.3, patient at increased risk of stroke secondary to discontinuation of anticoagulation medication unfortunately will have to continue without anticoagulation medication as patient's hemoglobin dropped from 8.2-7.3 while on heparin. Hemoglobin currently stable 7.3 - Rate controlled on Cardizem and beta blocker  Anemia - We'll assess hemoglobin every 8 hours    Chronic diastolic CHF (congestive heart failure) - Compensated currently, continue data blocker, Lasix    CKD (chronic kidney disease)   [name] - Patient to follow-up with dermatologist for further evaluation recommendations  DVT prophylaxis: SCDs  Code Status: DO NOT RESUSCITATE Family Communication: No family at bedside Disposition Plan: Pending improvement in condition   Consultants:  Orthopedic surgery  Procedures:  None  Antibiotics:  Cipro and Minocycline  HPI/Subjective: Patient has no new complaints. No acute issues overnight  Objective: Filed Vitals:   03/24/15 1300  BP: 100/34  Pulse: 60  Temp: 98.2 F (36.8 C)  Resp: 16    Intake/Output Summary (Last 24 hours) at 03/24/15 1538 Last data filed at 03/24/15 1500  Gross per 24 hour  Intake    240 ml  Output    600 ml  Net   -360 ml    Filed Weights   03/24/15 0552  Weight: 78.9 kg (173 lb 15.1 oz)    Exam:   General:  Patient in no acute distress, alert and awake  Cardiovascular: S1 and S2, no murmurs  Respiratory: Clear to auscultation bilaterally, no wheezes  Abdomen: Soft, nondistended  Musculoskeletal: No cyanosis on limited exam   Data Reviewed: Basic Metabolic Panel:  Recent Labs Lab 03/23/15 0028 03/23/15 0100 03/24/15 0313  NA 136 136 134*  K 4.0 4.2 4.4  CL 101 103 100  CO2  --  23 27  GLUCOSE 104* 103* 106*  BUN 38* 41* 38*  CREATININE 1.30* 1.28* 1.66*  CALCIUM  --  7.8* 7.6*   Liver Function Tests:  Recent Labs Lab 03/23/15 0100 03/24/15 0313  AST 24 21  ALT 16 14  ALKPHOS 84 75  BILITOT 0.5 0.4  PROT 4.8* 4.3*  ALBUMIN 1.8* 1.5*   No results for input(s): LIPASE, AMYLASE in the last 168 hours. No results for input(s): AMMONIA in the last 168 hours. CBC:  Recent Labs Lab 03/23/15 0028 03/23/15 0100 03/23/15 2311 03/24/15 0313 03/24/15 1233  WBC  --  8.6 6.9 7.5 6.7  NEUTROABS  --  6.7  --  5.1  --   HGB 8.8* 8.2* 7.3* 7.3* 7.3*  HCT 26.0* 26.3* 23.2* 23.2* 23.7*  MCV  --  94.6 95.5 95.5 95.2  PLT  --  189 187 179 168   Cardiac Enzymes:  Recent Labs Lab 03/23/15 0100  CKTOTAL 92   BNP (last 3 results)  Recent Labs  12/29/14 1258 02/09/15 1030  BNP 593.0* 676.0*    ProBNP (last 3 results) No results for input(s):  PROBNP in the last 8760 hours.  CBG: No results for input(s): GLUCAP in the last 168 hours.  Recent Results (from the past 240 hour(s))  MRSA PCR Screening     Status: Abnormal   Collection Time: 03/23/15  1:57 AM  Result Value Ref Range Status   MRSA by PCR POSITIVE (A) NEGATIVE Final    Comment:        The GeneXpert MRSA Assay (FDA approved for NASAL specimens only), is one component of a comprehensive MRSA colonization surveillance program. It is not intended to diagnose MRSA infection nor to guide or monitor treatment  for MRSA infections. RESULT CALLED TO, READ BACK BY AND VERIFIED WITH: NOTIFIED A AKRIDGE,RN 03/23/15 0626 BY RHOLMES      Studies: Ct Head Wo Contrast  03/22/2015   CLINICAL DATA:  81 year old female status post fall out of wheelchair  EXAM: CT HEAD WITHOUT CONTRAST  CT CERVICAL SPINE WITHOUT CONTRAST  TECHNIQUE: Multidetector CT imaging of the head and cervical spine was performed following the standard protocol without intravenous contrast. Multiplanar CT image reconstructions of the cervical spine were also generated.  COMPARISON:  Prior CT scan of the head and cervical spine 05/08/2009  FINDINGS: CT HEAD FINDINGS  Negative for acute intracranial hemorrhage, acute infarction, mass, mass effect, hydrocephalus or midline shift. Gray-white differentiation is preserved throughout. Small right occipital scalp contusion. No evidence of underlying calvarial fracture. Globes and orbits are symmetric bilaterally. Normal aeration of the mastoid air cells and the visualized paranasal sinuses.  CT CERVICAL SPINE FINDINGS  No acute fracture, malalignment or prevertebral soft tissue swelling. Exaggerated thoracic kyphosis. Mild multilevel cervical spondylosis without focality. No acute soft tissue abnormality. Biapical pulmonary emphysema.  IMPRESSION: CT HEAD  1. No acute intracranial abnormality. 2. Small right occipital scalp contusion. CT CSPINE  1. No acute fracture or malalignment. 2. Pulmonary emphysema. 3. Exaggerated thoracic kyphosis.   Electronically Signed   By: Malachy Moan M.D.   On: 03/22/2015 22:04   Ct Cervical Spine Wo Contrast  03/22/2015   CLINICAL DATA:  81 year old female status post fall out of wheelchair  EXAM: CT HEAD WITHOUT CONTRAST  CT CERVICAL SPINE WITHOUT CONTRAST  TECHNIQUE: Multidetector CT imaging of the head and cervical spine was performed following the standard protocol without intravenous contrast. Multiplanar CT image reconstructions of the cervical spine were also  generated.  COMPARISON:  Prior CT scan of the head and cervical spine 05/08/2009  FINDINGS: CT HEAD FINDINGS  Negative for acute intracranial hemorrhage, acute infarction, mass, mass effect, hydrocephalus or midline shift. Gray-white differentiation is preserved throughout. Small right occipital scalp contusion. No evidence of underlying calvarial fracture. Globes and orbits are symmetric bilaterally. Normal aeration of the mastoid air cells and the visualized paranasal sinuses.  CT CERVICAL SPINE FINDINGS  No acute fracture, malalignment or prevertebral soft tissue swelling. Exaggerated thoracic kyphosis. Mild multilevel cervical spondylosis without focality. No acute soft tissue abnormality. Biapical pulmonary emphysema.  IMPRESSION: CT HEAD  1. No acute intracranial abnormality. 2. Small right occipital scalp contusion. CT CSPINE  1. No acute fracture or malalignment. 2. Pulmonary emphysema. 3. Exaggerated thoracic kyphosis.   Electronically Signed   By: Malachy Moan M.D.   On: 03/22/2015 22:04   Ct Pelvis Wo Contrast  03/23/2015   CLINICAL DATA:  81 year old female with right pelvic fractures after falling from her wheelchair  EXAM: CT PELVIS WITHOUT CONTRAST  TECHNIQUE: Multidetector CT imaging of the pelvis was performed following the standard protocol without intravenous contrast.  COMPARISON:  Pelvic and right hip radiographs 03/22/2015  FINDINGS: Visualized intra of pelvic contents remarkable for sigmoid diverticulosis without evidence of active diverticulitis and moderate rectal stool burden. Normal appendix noted in the right lower quadrant. Unremarkable uterus and bladder. No free fluid or suspicious adenopathy.  Diffuse atherosclerotic vascular calcifications.  Nondisplaced fracture through the anterior aspect of the right sacral ala at S4. Comminuted but minimally displaced fracture through the superior pubic ramus on the right. Additionally, there is a mildly displaced fracture through the  inferior pubic ramus. The acetabulum is intact. The right femur is intact. The left pelvis and femur are intact. Mild hematoma surrounding the superior and inferior pubic rami fractures. No joint effusion. No significant hematoma about sacral fracture.  IMPRESSION: 1. Mildly displaced acute fractures of the parasymphyseal superior pubic ramus and mid inferior pubic ramus as seen on the prior radiograph. Mild peri-fracture hematoma. 2. Nondisplaced fracture through the anterior aspect of the right S4 sacral ala. 3. Colonic diverticular disease without CT evidence of active inflammation. 4. Atherosclerotic vascular calcifications.   Electronically Signed   By: Malachy Moan M.D.   On: 03/23/2015 01:23   Dg Hip Unilat With Pelvis 2-3 Views Left  03/22/2015   CLINICAL DATA:  81 year old female with right leg pain after fall  EXAM: LEFT HIP (WITH PELVIS) 2-3 VIEWS  COMPARISON:  Prior pelvic and left hip radiographs 02/14/2015  FINDINGS: Acute minimally displaced fractures of the right superior and inferior pubic rami. The right proximal femur remains intact. The bones appear osteopenic. Mild atherosclerotic vascular calcifications.  IMPRESSION: 1. Minimally displaced fractures of the right superior and inferior pubic rami.   Electronically Signed   By: Malachy Moan M.D.   On: 03/22/2015 22:05    Scheduled Meds: . atorvastatin  20 mg Oral Daily  . Chlorhexidine Gluconate Cloth  6 each Topical Q0600  . ciprofloxacin  500 mg Oral BID  . citalopram  20 mg Oral Daily  . diltiazem  120 mg Oral BID  . ferrous sulfate  325 mg Oral BID WC  . folic acid  1 mg Oral Daily  . furosemide  20 mg Oral Daily  . levothyroxine  100 mcg Oral QAC breakfast  . loratadine  10 mg Oral Daily  . metoprolol  50 mg Oral BID  . minocycline  100 mg Oral BID  . mupirocin ointment  1 application Nasal BID  . traZODone  150 mg Oral QHS  . white petrolatum       Continuous Infusions:   Time spent: > 35  minutes    Penny Pia  [facility] Pager [phone] If 7PM-7AM, please contact night-coverage at [email or web address], password [facility] 03/24/2015, 3:38 PM

## 2015-03-25 DIAGNOSIS — S32509S Unspecified fracture of unspecified pubis, sequela: Secondary | ICD-10-CM | POA: Diagnosis not present

## 2015-03-25 DIAGNOSIS — J449 Chronic obstructive pulmonary disease, unspecified: Secondary | ICD-10-CM | POA: Diagnosis not present

## 2015-03-25 DIAGNOSIS — N189 Chronic kidney disease, unspecified: Secondary | ICD-10-CM | POA: Diagnosis not present

## 2015-03-25 DIAGNOSIS — E039 Hypothyroidism, unspecified: Secondary | ICD-10-CM | POA: Diagnosis not present

## 2015-03-25 LAB — CBC
HEMATOCRIT: 22.9 % — AB (ref 36.0–46.0)
HEMATOCRIT: 23.7 % — AB (ref 36.0–46.0)
Hemoglobin: 7.2 g/dL — ABNORMAL LOW (ref 12.0–15.0)
Hemoglobin: 7.5 g/dL — ABNORMAL LOW (ref 12.0–15.0)
MCH: 29.6 pg (ref 26.0–34.0)
MCH: 29.8 pg (ref 26.0–34.0)
MCHC: 31.4 g/dL (ref 30.0–36.0)
MCHC: 31.6 g/dL (ref 30.0–36.0)
MCV: 94.2 fL (ref 78.0–100.0)
MCV: 94 fL (ref 78.0–100.0)
PLATELETS: 151 10*3/uL (ref 150–400)
Platelets: 159 10*3/uL (ref 150–400)
RBC: 2.43 MIL/uL — ABNORMAL LOW (ref 3.87–5.11)
RBC: 2.52 MIL/uL — ABNORMAL LOW (ref 3.87–5.11)
RDW: 16.3 % — AB (ref 11.5–15.5)
RDW: 16.4 % — AB (ref 11.5–15.5)
WBC: 5.7 10*3/uL (ref 4.0–10.5)
WBC: 5.6 10*3/uL (ref 4.0–10.5)

## 2015-03-25 MED ORDER — SODIUM CHLORIDE 0.9 % IJ SOLN: 3.0000 mL | INTRAMUSCULAR | Status: DC | PRN

## 2015-03-25 MED ORDER — FERROUS SULFATE 325 (65 FE) MG PO TABS: 325.0000 mg | ORAL_TABLET | Freq: Two times a day (BID) | ORAL | Status: DC

## 2015-03-25 MED ORDER — LORAZEPAM 0.5 MG PO TABS: 0.5000 mg | ORAL_TABLET | Freq: Two times a day (BID) | ORAL | Status: DC | PRN

## 2015-03-25 MED ORDER — METOPROLOL TARTRATE 25 MG PO TABS: 25.0000 mg | ORAL_TABLET | Freq: Two times a day (BID) | ORAL | Status: DC

## 2015-03-25 MED ORDER — SODIUM CHLORIDE 0.9 % IJ SOLN
3.0000 mL | Freq: Two times a day (BID) | INTRAMUSCULAR | Status: DC
  Administered 2015-03-25: 3 mL via INTRAVENOUS

## 2015-03-25 MED ORDER — FUROSEMIDE 20 MG PO TABS: 20.0000 mg | ORAL_TABLET | ORAL | Status: DC

## 2015-03-25 MED ORDER — HYDROCODONE-ACETAMINOPHEN 7.5-325 MG PO TABS: 1.0000 | ORAL_TABLET | ORAL | Status: DC | PRN

## 2015-03-25 NOTE — Discharge Summary (Signed)
Physician Discharge Summary  ARETTA STETZEL ZOX:[id number] DOB: July 13, 1933 DOA: 03/22/2015  PCP: Ignatius Specking., MD  Admit date: 03/22/2015 Discharge date: 03/25/2015  Time spent: > 35 minutes  Recommendations for Outpatient Follow-up:  1. Please reassess hemoglobin levels within the next week 2. In the future decide with family whether or not to continue anticoagulation. At this juncture held secondary to decrease in hemoglobin levels as well as history of falls 3. Metoprolol dose decreased given soft her blood pressures and bradycardia 4. Patient will need continue physical therapy at skilled nursing facility. Please ensure patient's pain level  continues to be well controlled 5. Reassess serum creatinine within the next week  Discharge Diagnoses:  Principal Problem:   Fracture of multiple pubic rami Active Problems:   COPD (chronic obstructive pulmonary disease)   Hypothyroidism   Atrial fibrillation   Chronic diastolic CHF (congestive heart failure)   Protein-calorie malnutrition   CKD (chronic kidney disease)   [name]   Discharge Condition: Stable  Diet recommendation: Heart healthy  Filed Weights   03/24/15 0552  Weight: 78.9 kg (173 lb 15.1 oz)    History of present illness:  Patient is a 81 year old with history of hypothyroidism congestive heart failure, essential hypertension, atrial fibrillation on anticoagulation as outpatient, chronic kidney disease, chronic respiratory failure who is a resident at an assisted living facility and presented to the hospital after she lost balance on her wheelchair and fell down. He T scan of pelvis would report acute displaced fracture of the parasymphyseal superior pubic ramus and mild inferior pubic ramus with mild peri-fracture hematoma.  Hospital Course:  Principal Problem:  Fracture of multiple pubic rami - treat supportively - Patient has history of mild peri-fracture hematoma as well as fall as such will not  continue anticoagulation on discharge. This was discussed with patient and family member verbalized understanding and agreement. - Physical therapy to work with patient while at skilled nursing facility - Orthopedic surgeon initially consulted who recommended the following: Pt does not require surgery for this injury May ambulate as tolerated with PT and rolling walker but may be quite painful  Active Problems:  COPD (chronic obstructive pulmonary disease) - Compensated currently, continue Xopenex   Hypothyroidism - Stable continue Synthroid   Atrial fibrillation - Unable to anticoagulate secondary to worsening anemia with last hemoglobin is 7.3, patient at increased risk of stroke secondary to discontinuation of anticoagulation medication unfortunately will have to continue without anticoagulation medication as patient's hemoglobin dropped from 8.2-7.3 while on heparin. Hemoglobin currently stable 7.5 - Rate controlled on Cardizem and beta blocker (will decrease beta blocker dose on discharge given bradycardia)  Anemia - Stable and on last check improved from prior with last value at 7.5.   Chronic diastolic CHF (congestive heart failure) - Compensated currently, continue beta blocker, Lasix will be continued in 2 days after discharge as her serum creatinine and BUNs levels were rising as such will hold for 2 days   CKD (chronic kidney disease) - As listed above will hold Lasix for 2 days after discharge given elevation of serum creatinine and BUNs   [name] - Patient to follow-up with dermatologist for further evaluation recommendations   Procedures:  None  Consultations:  Orthopedic surgery  Discharge Exam: Filed Vitals:   03/25/15 0420  BP: 124/41  Pulse: 56  Temp: 98.4 F (36.9 C)  Resp: 116    General: Patient in no acute distress, alert and awake Cardiovascular: S1 and S2 within normal limits, no  rubs Respiratory: Clear to auscultation  bilaterally, no wheezes  Discharge Instructions   Discharge Instructions    Call MD for:  difficulty breathing, headache or visual disturbances    Complete by:  As directed      Call MD for:  severe uncontrolled pain    Complete by:  As directed      Call MD for:  temperature >100.4    Complete by:  As directed      Diet - low sodium heart healthy    Complete by:  As directed      Increase activity slowly    Complete by:  As directed           Current Discharge Medication List    START taking these medications   Details  ferrous sulfate 325 (65 FE) MG tablet Take 1 tablet (325 mg total) by mouth 2 (two) times daily with a meal. Qty: 60 tablet, Refills: 0      CONTINUE these medications which have CHANGED   Details  furosemide (LASIX) 20 MG tablet Take 1 tablet (20 mg total) by mouth every other day. Alternate with 40 mg Qty: 30 tablet, Refills: 0    HYDROcodone-acetaminophen (NORCO) 7.5-325 MG per tablet Take 1 tablet by mouth every 4 (four) hours as needed for moderate pain. Qty: 30 tablet, Refills: 0    LORazepam (ATIVAN) 0.5 MG tablet Take 1 tablet (0.5 mg total) by mouth 2 (two) times daily as needed for anxiety. Qty: 10 tablet, Refills: 0    metoprolol (LOPRESSOR) 25 MG tablet Take 1 tablet (25 mg total) by mouth 2 (two) times daily.      CONTINUE these medications which have NOT CHANGED   Details  atorvastatin (LIPITOR) 20 MG tablet Take 20 mg by mouth daily.    cetirizine (ZYRTEC) 10 MG tablet Take 10 mg by mouth 2 (two) times daily.     ciprofloxacin (CIPRO) 500 MG tablet Take 500 mg by mouth 2 (two) times daily.    citalopram (CELEXA) 20 MG tablet Take 20 mg by mouth daily.    diltiazem (CARDIZEM CD) 120 MG 24 hr capsule Take 1 capsule (120 mg total) by mouth 2 (two) times daily.    diphenhydrAMINE (BENADRYL) 25 mg capsule Take 25 mg by mouth every 8 (eight) hours as needed.    folic acid (FOLVITE) 1 MG tablet Take 1 mg by mouth daily.    levalbuterol  (XOPENEX) 0.63 MG/3ML nebulizer solution Take 3 mLs (0.63 mg total) by nebulization every 6 (six) hours as needed for wheezing or shortness of breath. Qty: 3 mL, Refills: 12    levothyroxine (SYNTHROID, LEVOTHROID) 100 MCG tablet Take 100 mcg by mouth daily before breakfast.    methotrexate 2.5 MG tablet Take 10 mg by mouth once a week. On Sunday.    minocycline (MINOCIN,DYNACIN) 100 MG capsule Take 100 mg by mouth 2 (two) times daily.    traZODone (DESYREL) 150 MG tablet Take 150 mg by mouth at bedtime.       STOP taking these medications     guaiFENesin-dextromethorphan (ROBITUSSIN DM) 100-10 MG/5ML syrup      Rivaroxaban (XARELTO) 15 MG TABS tablet        Allergies  Allergen Reactions  . Ambien [Zolpidem Tartrate]   . Sulfa Antibiotics Nausea And Vomiting  . Clindamycin/Lincomycin Swelling and Rash      The results of significant diagnostics from this hospitalization (including imaging, microbiology, ancillary and laboratory) are listed below for reference.  Significant Diagnostic Studies: Ct Head Wo Contrast  03/22/2015   CLINICAL DATA:  81 year old female status post fall out of wheelchair  EXAM: CT HEAD WITHOUT CONTRAST  CT CERVICAL SPINE WITHOUT CONTRAST  TECHNIQUE: Multidetector CT imaging of the head and cervical spine was performed following the standard protocol without intravenous contrast. Multiplanar CT image reconstructions of the cervical spine were also generated.  COMPARISON:  Prior CT scan of the head and cervical spine 05/08/2009  FINDINGS: CT HEAD FINDINGS  Negative for acute intracranial hemorrhage, acute infarction, mass, mass effect, hydrocephalus or midline shift. Gray-white differentiation is preserved throughout. Small right occipital scalp contusion. No evidence of underlying calvarial fracture. Globes and orbits are symmetric bilaterally. Normal aeration of the mastoid air cells and the visualized paranasal sinuses.  CT CERVICAL SPINE FINDINGS  No acute  fracture, malalignment or prevertebral soft tissue swelling. Exaggerated thoracic kyphosis. Mild multilevel cervical spondylosis without focality. No acute soft tissue abnormality. Biapical pulmonary emphysema.  IMPRESSION: CT HEAD  1. No acute intracranial abnormality. 2. Small right occipital scalp contusion. CT CSPINE  1. No acute fracture or malalignment. 2. Pulmonary emphysema. 3. Exaggerated thoracic kyphosis.   Electronically Signed   By: Malachy Moan M.D.   On: 03/22/2015 22:04   Ct Cervical Spine Wo Contrast  03/22/2015   CLINICAL DATA:  81 year old female status post fall out of wheelchair  EXAM: CT HEAD WITHOUT CONTRAST  CT CERVICAL SPINE WITHOUT CONTRAST  TECHNIQUE: Multidetector CT imaging of the head and cervical spine was performed following the standard protocol without intravenous contrast. Multiplanar CT image reconstructions of the cervical spine were also generated.  COMPARISON:  Prior CT scan of the head and cervical spine 05/08/2009  FINDINGS: CT HEAD FINDINGS  Negative for acute intracranial hemorrhage, acute infarction, mass, mass effect, hydrocephalus or midline shift. Gray-white differentiation is preserved throughout. Small right occipital scalp contusion. No evidence of underlying calvarial fracture. Globes and orbits are symmetric bilaterally. Normal aeration of the mastoid air cells and the visualized paranasal sinuses.  CT CERVICAL SPINE FINDINGS  No acute fracture, malalignment or prevertebral soft tissue swelling. Exaggerated thoracic kyphosis. Mild multilevel cervical spondylosis without focality. No acute soft tissue abnormality. Biapical pulmonary emphysema.  IMPRESSION: CT HEAD  1. No acute intracranial abnormality. 2. Small right occipital scalp contusion. CT CSPINE  1. No acute fracture or malalignment. 2. Pulmonary emphysema. 3. Exaggerated thoracic kyphosis.   Electronically Signed   By: Malachy Moan M.D.   On: 03/22/2015 22:04   Ct Pelvis Wo  Contrast  03/23/2015   CLINICAL DATA:  81 year old female with right pelvic fractures after falling from her wheelchair  EXAM: CT PELVIS WITHOUT CONTRAST  TECHNIQUE: Multidetector CT imaging of the pelvis was performed following the standard protocol without intravenous contrast.  COMPARISON:  Pelvic and right hip radiographs 03/22/2015  FINDINGS: Visualized intra of pelvic contents remarkable for sigmoid diverticulosis without evidence of active diverticulitis and moderate rectal stool burden. Normal appendix noted in the right lower quadrant. Unremarkable uterus and bladder. No free fluid or suspicious adenopathy.  Diffuse atherosclerotic vascular calcifications.  Nondisplaced fracture through the anterior aspect of the right sacral ala at S4. Comminuted but minimally displaced fracture through the superior pubic ramus on the right. Additionally, there is a mildly displaced fracture through the inferior pubic ramus. The acetabulum is intact. The right femur is intact. The left pelvis and femur are intact. Mild hematoma surrounding the superior and inferior pubic rami fractures. No joint effusion. No significant hematoma about sacral fracture.  IMPRESSION: 1. Mildly displaced acute fractures of the parasymphyseal superior pubic ramus and mid inferior pubic ramus as seen on the prior radiograph. Mild peri-fracture hematoma. 2. Nondisplaced fracture through the anterior aspect of the right S4 sacral ala. 3. Colonic diverticular disease without CT evidence of active inflammation. 4. Atherosclerotic vascular calcifications.   Electronically Signed   By: Malachy Moan M.D.   On: 03/23/2015 01:23   Dg Hip Unilat With Pelvis 2-3 Views Left  03/22/2015   CLINICAL DATA:  81 year old female with right leg pain after fall  EXAM: LEFT HIP (WITH PELVIS) 2-3 VIEWS  COMPARISON:  Prior pelvic and left hip radiographs 02/14/2015  FINDINGS: Acute minimally displaced fractures of the right superior and inferior pubic rami.  The right proximal femur remains intact. The bones appear osteopenic. Mild atherosclerotic vascular calcifications.  IMPRESSION: 1. Minimally displaced fractures of the right superior and inferior pubic rami.   Electronically Signed   By: Malachy Moan M.D.   On: 03/22/2015 22:05    Microbiology: Recent Results (from the past 240 hour(s))  MRSA PCR Screening     Status: Abnormal   Collection Time: 03/23/15  1:57 AM  Result Value Ref Range Status   MRSA by PCR POSITIVE (A) NEGATIVE Final    Comment:        The GeneXpert MRSA Assay (FDA approved for NASAL specimens only), is one component of a comprehensive MRSA colonization surveillance program. It is not intended to diagnose MRSA infection nor to guide or monitor treatment for MRSA infections. RESULT CALLED TO, READ BACK BY AND VERIFIED WITH: NOTIFIED A AKRIDGE,RN 03/23/15 0626 BY RHOLMES      Labs: Basic Metabolic Panel:  Recent Labs Lab 03/23/15 0028 03/23/15 0100 03/24/15 0313  NA 136 136 134*  K 4.0 4.2 4.4  CL 101 103 100  CO2  --  23 27  GLUCOSE 104* 103* 106*  BUN 38* 41* 38*  CREATININE 1.30* 1.28* 1.66*  CALCIUM  --  7.8* 7.6*   Liver Function Tests:  Recent Labs Lab 03/23/15 0100 03/24/15 0313  AST 24 21  ALT 16 14  ALKPHOS 84 75  BILITOT 0.5 0.4  PROT 4.8* 4.3*  ALBUMIN 1.8* 1.5*   No results for input(s): LIPASE, AMYLASE in the last 168 hours. No results for input(s): AMMONIA in the last 168 hours. CBC:  Recent Labs Lab 03/23/15 0100  03/24/15 0313 03/24/15 1233 03/24/15 1926 03/25/15 0351 03/25/15 1227  WBC 8.6  < > 7.5 6.7 6.8 5.7 5.6  NEUTROABS 6.7  --  5.1  --   --   --   --   HGB 8.2*  < > 7.3* 7.3* 7.6* 7.2* 7.5*  HCT 26.3*  < > 23.2* 23.7* 24.0* 22.9* 23.7*  MCV 94.6  < > 95.5 95.2 94.5 94.2 94.0  PLT 189  < > 179 168 157 151 159  < > = values in this interval not displayed. Cardiac Enzymes:  Recent Labs Lab 03/23/15 0100  CKTOTAL 92   BNP: BNP (last 3  results)  Recent Labs  12/29/14 1258 02/09/15 1030  BNP 593.0* 676.0*    ProBNP (last 3 results) No results for input(s): PROBNP in the last 8760 hours.  CBG: No results for input(s): GLUCAP in the last 168 hours.     Signed:  Penny Pia  [facility] 03/25/2015, 2:48 PM

## 2015-03-25 NOTE — Progress Notes (Signed)
1930 pt picked up for transport to [facility]. Iv from right hand removed. Pt attempted to void on bedpan with lots of complaints when she was moved. She was unable to void at that time. She received pain medication two hours ago by day shift nurse. Dressings present on her abdomen where she had abrasions and blisters.

## 2015-03-25 NOTE — Progress Notes (Signed)
CSW was notified by CM for d/c planning.   CSW attempted to provide bed offers over the phone, however the Pt's son  Youlanda Roys(Barry Glosson  [phone]) was driving and could not participate with bed choice. Pt's son stated that he can come into the hospital in about 2 1/2 hours to look at the Pt's bed offers and choose a SNF. CSW encouraged the son to try and be at the hospital as soon as possible due to the Pt is ready for d/c today.     CSW will continue to follow Pt for d/c planning today.     Leron Croakassandra Jaylyn Iyer Carrillo Surgery CenterCSWA  [facility]  2S, 63M, 5N, 6N, 6E, PEDS/PICU [phone]:[phone]

## 2015-03-25 NOTE — Progress Notes (Signed)
Report called to [facility].  PTAR notified re:  Pick up of patient for transport.

## 2015-03-25 NOTE — Progress Notes (Signed)
Pt to be d/c today to [facility]   Pt and family agreeable. Confirmed plans with facility.  Plan transfer via EMS.    Catalena Stanhope LCSWA  [facility]  4N 1-16;  6N1-16 Phone: [phone]    

## 2015-03-25 NOTE — Evaluation (Signed)
Physical Therapy Evaluation Patient Details Name: Sierra KalesShelby J Singleton MRN: [id number] DOB: 1933-07-09 Today's Date: 03/25/2015   History of Present Illness  Pt is 81 yo woman who suffered mechanical fall at ALF and sustained multiple R pubic rami fractures. Pt  with h/o dementia per family report per nursing.  Clinical Impression  Pt admitted with above. OOB mobility greatly limited by patients increased pain and impaired cognition. Pt perseverating on "missing" grandchild, however that is not the case. Pt adamantly refused to move R LE and when attempted to transfer to EOB with support of 2 people and bed pad pt screamed and strongly resisted. Suspect pt's mobility progression to be slow, thus requiring SNF upon d/c to allow for maximal functional recovery.    Follow Up Recommendations SNF;Supervision/Assistance - 24 hour    Equipment Recommendations  None recommended by PT    Recommendations for Other Services       Precautions / Restrictions Precautions Precautions: Fall Restrictions Weight Bearing Restrictions: No      Mobility  Bed Mobility Overal bed mobility: Needs Assistance;+2 for physical assistance             General bed mobility comments: attempted to sit EOB however patient screaming in pain and strongly resisting despite v/c's for technigue and deep breaths. [facility].  Transfers                    Ambulation/Gait                Stairs            Wheelchair Mobility    Modified Rankin (Stroke Patients Only)       Balance                                             Pertinent Vitals/Pain Pain Assessment: 0-10 Pain Score: 10-Worst pain ever Pain Location: R hip Pain Intervention(s): Premedicated before session    Home Living Family/patient expects to be discharged to:: Skilled nursing facility                 Additional Comments: pt has been at ALF for  3 weeks.    Prior Function Level of Independence: Needs assistance   Gait / Transfers Assistance Needed: pt poor historian but per chart was in a w/c at ALF  ADL's / Homemaking Assistance Needed: unable to receive accurate PLOF suspect pt required assist due to dementia  Comments: per nursing, per son pt was at ALF for 3 weeks PTA.     Hand Dominance        Extremity/Trunk Assessment   Upper Extremity Assessment: Overall WFL for tasks assessed           Lower Extremity Assessment: RLE deficits/detail;LLE deficits/detail RLE Deficits / Details: pt refusing to move R LE at all due to pain, was able to move ankle and toes. pt was able to complete quad set LLE Deficits / Details: pt initiated hip/knee flexion and was able to do quad set and move ankle/toes [facility]  Cervical / Trunk Assessment: Normal  Communication      Cognition Arousal/Alertness: Awake/alert Behavior During Therapy: Anxious;Agitated (perseverating on "missing" grandchild, tearful/wheepy) Overall Cognitive Status: Impaired/Different from baseline Area of Impairment: Memory;Safety/judgement;Awareness;Orientation Orientation Level: Disoriented to;Time;Situation (when edu pt on recent fall pt  reports "oh yes i knew that")   Memory: Decreased short-term memory     Awareness: Emergent   General Comments: pt perseverating on "missing" grandchild, however when RN spoke to son there is no missing grandchild. Pt very emotional and in pain greatly limiting participation in PT.    General Comments General comments (skin integrity, edema, etc.): pt with noted rash in groin and bellow belly fold    Exercises        Assessment/Plan    PT Assessment Patient needs continued PT services  PT Diagnosis Difficulty walking;Acute pain   PT Problem List Decreased strength;Decreased range of motion;Decreased activity tolerance;Decreased balance;Decreased mobility;Decreased knowledge of use of DME;Decreased cognition;Pain   PT Treatment Interventions DME instruction;Gait training;Functional mobility training;Therapeutic activities;Therapeutic exercise   PT Goals (Current goals can be found in the Care Plan section) Acute Rehab PT Goals Patient Stated Goal: didn't state PT Goal Formulation: Patient unable to participate in goal setting Time For Goal Achievement: 04/01/15 Potential to Achieve Goals: Fair    Frequency Min 3X/week   Barriers to discharge Decreased caregiver support pt from ALF will need SNF    Co-evaluation               End of Session   Activity Tolerance: Patient limited by pain;Treatment limited secondary to agitation Patient left: in bed;with call bell/phone within reach;with bed alarm set Nurse Communication: Mobility status (perseveration on missing child)    Functional Assessment Tool Used: clinical observation Functional Limitation: Mobility: Walking and moving around Mobility: Walking and Moving Around Current Status (Z6109): At least 80 percent but less than 100 percent impaired, limited or restricted Mobility: Walking and Moving Around Goal Status [phone]): At least 20 percent but less than 40 percent impaired, limited or restricted    Time: 0981-1914 PT Time Calculation (min) (ACUTE ONLY): 29 min   Charges:   PT Evaluation $Initial PT Evaluation Tier I: 1 Procedure PT Treatments $Therapeutic Activity: 8-22 mins   PT G Codes:   PT G-Codes **NOT FOR INPATIENT CLASS** Functional Assessment Tool Used: clinical observation Functional Limitation: Mobility: Walking and moving around Mobility: Walking and Moving Around Current Status (N8295): At least 80 percent but less than 100 percent impaired, limited or restricted Mobility: Walking and Moving Around Goal Status [phone]): At least 20 percent but less than 40 percent impaired, limited or restricted    Marcene Brawn 03/25/2015, 10:56 AM   Lewis Shock, PT, DPT Pager #: [phone] Office #: [phone]

## 2015-03-25 NOTE — Progress Notes (Signed)
UR completed 

## 2015-03-25 NOTE — Progress Notes (Signed)
Patient very upset.  Obsessing about one of her grandchildren "missing".  Tearful and weepy.  Son notified and he called to speak with her.  Appeared to settle, though asking when he will come to visit.  Patient is confused at this time / disoriented as to why she is here.

## 2015-03-31 ENCOUNTER — Ambulatory Visit: Payer: Medicare Other | Admitting: Cardiovascular Disease

## 2015-04-05 ENCOUNTER — Inpatient Hospital Stay (HOSPITAL_COMMUNITY): Payer: Medicare Other

## 2015-04-05 ENCOUNTER — Other Ambulatory Visit (HOSPITAL_COMMUNITY): Payer: Self-pay

## 2015-04-05 ENCOUNTER — Encounter (HOSPITAL_COMMUNITY): Payer: Self-pay | Admitting: Emergency Medicine

## 2015-04-05 ENCOUNTER — Inpatient Hospital Stay (HOSPITAL_COMMUNITY)
Admission: EM | Admit: 2015-04-05 | Discharge: 2015-04-10 | DRG: 812 | Disposition: A | Discharge status: Skilled Nursing Facility | Payer: Medicare Other | Attending: Family Medicine | Admitting: Family Medicine

## 2015-04-05 DIAGNOSIS — N189 Chronic kidney disease, unspecified: Secondary | ICD-10-CM | POA: Diagnosis present

## 2015-04-05 DIAGNOSIS — Z881 Allergy status to other antibiotic agents status: Secondary | ICD-10-CM

## 2015-04-05 DIAGNOSIS — J9611 Chronic respiratory failure with hypoxia: Secondary | ICD-10-CM | POA: Diagnosis present

## 2015-04-05 DIAGNOSIS — D649 Anemia, unspecified: Secondary | ICD-10-CM

## 2015-04-05 DIAGNOSIS — Q828 Other specified congenital malformations of skin: Secondary | ICD-10-CM

## 2015-04-05 DIAGNOSIS — R339 Retention of urine, unspecified: Secondary | ICD-10-CM | POA: Diagnosis present

## 2015-04-05 DIAGNOSIS — Z79899 Other long term (current) drug therapy: Secondary | ICD-10-CM

## 2015-04-05 DIAGNOSIS — E785 Hyperlipidemia, unspecified: Secondary | ICD-10-CM | POA: Diagnosis present

## 2015-04-05 DIAGNOSIS — K5900 Constipation, unspecified: Secondary | ICD-10-CM | POA: Diagnosis present

## 2015-04-05 DIAGNOSIS — I482 Chronic atrial fibrillation: Secondary | ICD-10-CM

## 2015-04-05 DIAGNOSIS — S32599A Other specified fracture of unspecified pubis, initial encounter for closed fracture: Secondary | ICD-10-CM | POA: Diagnosis present

## 2015-04-05 DIAGNOSIS — I5032 Chronic diastolic (congestive) heart failure: Secondary | ICD-10-CM | POA: Diagnosis present

## 2015-04-05 DIAGNOSIS — Z888 Allergy status to other drugs, medicaments and biological substances status: Secondary | ICD-10-CM

## 2015-04-05 DIAGNOSIS — I129 Hypertensive chronic kidney disease with stage 1 through stage 4 chronic kidney disease, or unspecified chronic kidney disease: Secondary | ICD-10-CM | POA: Diagnosis present

## 2015-04-05 DIAGNOSIS — J449 Chronic obstructive pulmonary disease, unspecified: Secondary | ICD-10-CM | POA: Diagnosis present

## 2015-04-05 DIAGNOSIS — Z66 Do not resuscitate: Secondary | ICD-10-CM | POA: Diagnosis present

## 2015-04-05 DIAGNOSIS — S32509S Unspecified fracture of unspecified pubis, sequela: Secondary | ICD-10-CM

## 2015-04-05 DIAGNOSIS — Z9981 Dependence on supplemental oxygen: Secondary | ICD-10-CM

## 2015-04-05 DIAGNOSIS — I4891 Unspecified atrial fibrillation: Secondary | ICD-10-CM | POA: Diagnosis present

## 2015-04-05 DIAGNOSIS — D509 Iron deficiency anemia, unspecified: Principal | ICD-10-CM | POA: Diagnosis present

## 2015-04-05 DIAGNOSIS — E039 Hypothyroidism, unspecified: Secondary | ICD-10-CM | POA: Diagnosis present

## 2015-04-05 DIAGNOSIS — F419 Anxiety disorder, unspecified: Secondary | ICD-10-CM | POA: Diagnosis present

## 2015-04-05 HISTORY — DX: Systemic involvement of connective tissue, unspecified: M35.9

## 2015-04-05 HISTORY — DX: Heart failure, unspecified: I50.9

## 2015-04-05 LAB — IRON AND TIBC
Iron: 21 ug/dL — ABNORMAL LOW (ref 28–170)
SATURATION RATIOS: 11 % (ref 10.4–31.8)
TIBC: 197 ug/dL — AB (ref 250–450)
UIBC: 176 ug/dL

## 2015-04-05 LAB — BASIC METABOLIC PANEL
Anion gap: 10 (ref 5–15)
BUN: 25 mg/dL — ABNORMAL HIGH (ref 6–20)
CALCIUM: 8.2 mg/dL — AB (ref 8.9–10.3)
CO2: 27 mmol/L (ref 22–32)
CREATININE: 1.15 mg/dL — AB (ref 0.44–1.00)
Chloride: 100 mmol/L — ABNORMAL LOW (ref 101–111)
GFR calc Af Amer: 51 mL/min — ABNORMAL LOW (ref >60)
GFR calc non Af Amer: 44 mL/min — ABNORMAL LOW (ref >60)
GLUCOSE: 107 mg/dL — AB (ref 70–99)
Potassium: 3.6 mmol/L (ref 3.5–5.1)
Sodium: 137 mmol/L (ref 135–145)

## 2015-04-05 LAB — PREPARE RBC (CROSSMATCH)

## 2015-04-05 LAB — CBC WITH DIFFERENTIAL/PLATELET
BASOS ABS: 0 10*3/uL (ref 0.0–0.1)
Basophils Relative: 0 % (ref 0–1)
Eosinophils Absolute: 0.5 10*3/uL (ref 0.0–0.7)
Eosinophils Relative: 5 % (ref 0–5)
HCT: 17.5 % — ABNORMAL LOW (ref 36.0–46.0)
HEMOGLOBIN: 5.3 g/dL — AB (ref 12.0–15.0)
Lymphocytes Relative: 24 % (ref 12–46)
Lymphs Abs: 2.3 10*3/uL (ref 0.7–4.0)
MCH: 29.1 pg (ref 26.0–34.0)
MCHC: 30.3 g/dL (ref 30.0–36.0)
MCV: 96.2 fL (ref 78.0–100.0)
Monocytes Absolute: 0.4 10*3/uL (ref 0.1–1.0)
Monocytes Relative: 4 % (ref 3–12)
NEUTROS ABS: 6.6 10*3/uL (ref 1.7–7.7)
NEUTROS PCT: 67 % (ref 43–77)
PLATELETS: 270 10*3/uL (ref 150–400)
RBC: 1.82 MIL/uL — ABNORMAL LOW (ref 3.87–5.11)
RDW: 16 % — ABNORMAL HIGH (ref 11.5–15.5)
WBC: 9.9 10*3/uL (ref 4.0–10.5)

## 2015-04-05 LAB — FERRITIN: Ferritin: 145 ng/mL (ref 11–307)

## 2015-04-05 LAB — POC OCCULT BLOOD, ED: Fecal Occult Bld: NEGATIVE

## 2015-04-05 LAB — RETICULOCYTES
RBC.: 2.52 MIL/uL — ABNORMAL LOW (ref 3.87–5.11)
Retic Count, Absolute: 52.9 10*3/uL (ref 19.0–186.0)
Retic Ct Pct: 2.1 % (ref 0.4–3.1)

## 2015-04-05 LAB — FOLATE: Folate: 37 ng/mL (ref >5.9)

## 2015-04-05 LAB — LACTATE DEHYDROGENASE: LDH: 211 U/L — ABNORMAL HIGH (ref 98–192)

## 2015-04-05 LAB — VITAMIN B12: VITAMIN B 12: 194 pg/mL (ref 180–914)

## 2015-04-05 MED ORDER — SODIUM CHLORIDE 0.9 % IV SOLN: Freq: Once | INTRAVENOUS | Status: DC

## 2015-04-05 MED ORDER — CIPROFLOXACIN HCL 500 MG PO TABS
500.0000 mg | ORAL_TABLET | Freq: Two times a day (BID) | ORAL | Status: DC
  Administered 2015-04-05 – 2015-04-10 (×10): 500 mg via ORAL
  Filled 2015-04-05 (×12): qty 1

## 2015-04-05 MED ORDER — FERROUS SULFATE 325 (65 FE) MG PO TABS
325.0000 mg | ORAL_TABLET | Freq: Two times a day (BID) | ORAL | Status: DC
Start: 2015-04-06 — End: 2015-04-08
  Administered 2015-04-06 – 2015-04-08 (×5): 325 mg via ORAL
  Filled 2015-04-05 (×7): qty 1

## 2015-04-05 MED ORDER — TRAMADOL-ACETAMINOPHEN 37.5-325 MG PO TABS
1.0000 | ORAL_TABLET | Freq: Three times a day (TID) | ORAL | Status: DC | PRN
  Administered 2015-04-10: 1 via ORAL
  Filled 2015-04-05: qty 1

## 2015-04-05 MED ORDER — LORAZEPAM 0.5 MG PO TABS
0.5000 mg | ORAL_TABLET | Freq: Two times a day (BID) | ORAL | Status: DC | PRN
  Administered 2015-04-06 – 2015-04-08 (×2): 0.5 mg via ORAL
  Filled 2015-04-05 (×2): qty 1

## 2015-04-05 MED ORDER — FOLIC ACID 1 MG PO TABS
1.0000 mg | ORAL_TABLET | Freq: Every day | ORAL | Status: DC
  Administered 2015-04-05 – 2015-04-10 (×6): 1 mg via ORAL
  Filled 2015-04-05 (×6): qty 1

## 2015-04-05 MED ORDER — FENTANYL CITRATE (PF) 100 MCG/2ML IJ SOLN
50.0000 ug | Freq: Once | INTRAMUSCULAR | Status: AC
  Administered 2015-04-05: 50 ug via INTRAVENOUS
  Filled 2015-04-05: qty 2

## 2015-04-05 MED ORDER — ALBUTEROL SULFATE (2.5 MG/3ML) 0.083% IN NEBU
2.5000 mg | INHALATION_SOLUTION | Freq: Four times a day (QID) | RESPIRATORY_TRACT | Status: DC
  Administered 2015-04-05 – 2015-04-06 (×2): 2.5 mg via RESPIRATORY_TRACT
  Filled 2015-04-05 (×2): qty 3

## 2015-04-05 MED ORDER — ATORVASTATIN CALCIUM 20 MG PO TABS
20.0000 mg | ORAL_TABLET | Freq: Every day | ORAL | Status: DC
  Administered 2015-04-06 – 2015-04-10 (×5): 20 mg via ORAL
  Filled 2015-04-05 (×5): qty 1

## 2015-04-05 MED ORDER — TRAZODONE HCL 150 MG PO TABS
150.0000 mg | ORAL_TABLET | Freq: Every day | ORAL | Status: DC
  Administered 2015-04-05 – 2015-04-09 (×5): 150 mg via ORAL
  Filled 2015-04-05 (×7): qty 1

## 2015-04-05 MED ORDER — HYDROCODONE-ACETAMINOPHEN 7.5-325 MG PO TABS
1.0000 | ORAL_TABLET | ORAL | Status: DC | PRN
  Administered 2015-04-06 – 2015-04-10 (×15): 1 via ORAL
  Filled 2015-04-05 (×15): qty 1

## 2015-04-05 MED ORDER — MINOCYCLINE HCL 100 MG PO CAPS
100.0000 mg | ORAL_CAPSULE | Freq: Two times a day (BID) | ORAL | Status: DC
  Administered 2015-04-06 – 2015-04-10 (×10): 100 mg via ORAL
  Filled 2015-04-05 (×11): qty 1

## 2015-04-05 MED ORDER — SODIUM CHLORIDE 0.9 % IJ SOLN
3.0000 mL | Freq: Two times a day (BID) | INTRAMUSCULAR | Status: DC
  Administered 2015-04-06 – 2015-04-10 (×8): 3 mL via INTRAVENOUS

## 2015-04-05 MED ORDER — ACETAMINOPHEN 325 MG PO TABS: 650.0000 mg | ORAL_TABLET | Freq: Four times a day (QID) | ORAL | Status: DC | PRN

## 2015-04-05 MED ORDER — ONDANSETRON HCL 4 MG/2ML IJ SOLN: 4.0000 mg | Freq: Four times a day (QID) | INTRAMUSCULAR | Status: DC | PRN

## 2015-04-05 MED ORDER — IOHEXOL 300 MG/ML  SOLN
100.0000 mL | Freq: Once | INTRAMUSCULAR | Status: AC | PRN
  Administered 2015-04-05: 100 mL via INTRAVENOUS

## 2015-04-05 MED ORDER — ACETAMINOPHEN 650 MG RE SUPP: 650.0000 mg | Freq: Four times a day (QID) | RECTAL | Status: DC | PRN

## 2015-04-05 MED ORDER — DILTIAZEM HCL ER COATED BEADS 120 MG PO CP24
120.0000 mg | ORAL_CAPSULE | Freq: Two times a day (BID) | ORAL | Status: DC
  Administered 2015-04-06 – 2015-04-10 (×9): 120 mg via ORAL
  Filled 2015-04-05 (×10): qty 1

## 2015-04-05 MED ORDER — DIPHENHYDRAMINE HCL 25 MG PO CAPS: 25.0000 mg | ORAL_CAPSULE | Freq: Three times a day (TID) | ORAL | Status: DC | PRN

## 2015-04-05 MED ORDER — LEVOTHYROXINE SODIUM 100 MCG PO TABS
100.0000 ug | ORAL_TABLET | Freq: Every day | ORAL | Status: DC
  Administered 2015-04-06 – 2015-04-10 (×5): 100 ug via ORAL
  Filled 2015-04-05 (×6): qty 1

## 2015-04-05 MED ORDER — IOHEXOL 300 MG/ML  SOLN: 25.0000 mL | Freq: Once | INTRAMUSCULAR | Status: AC | PRN

## 2015-04-05 MED ORDER — CITALOPRAM HYDROBROMIDE 20 MG PO TABS
20.0000 mg | ORAL_TABLET | Freq: Every day | ORAL | Status: DC
  Administered 2015-04-05 – 2015-04-10 (×6): 20 mg via ORAL
  Filled 2015-04-05 (×3): qty 1
  Filled 2015-04-05: qty 2
  Filled 2015-04-05 (×2): qty 1

## 2015-04-05 MED ORDER — ONDANSETRON HCL 4 MG PO TABS: 4.0000 mg | ORAL_TABLET | Freq: Four times a day (QID) | ORAL | Status: DC | PRN

## 2015-04-05 NOTE — ED Notes (Addendum)
Pt presents to ED via EMS for low hemoglobin 6.7. [facility]. Rash noted under skin folds, breasts, groins, buttocks, axillary. Pt looks pale. Per EMS, O2-98% on 2L [place], pt on 2L [place] at the facilitiy.

## 2015-04-05 NOTE — H&P (Addendum)
[facility] History and Physical  Patient: Sierra Singleton  MRN: [id number]  DOB: 1933/09/09  DOS: the patient was seen and examined on 04/05/2015 PCP: Ignatius SpeckingVYAS,DHRUV B., MD  Referring physician: Dr. Post/Dr. Hyacinth MeekerMiller Chief Complaint: Abnormal lab  HPI: Sierra KalesShelby J Soeder is a 81 y.o. female with Past medical history of chronic rash, hypothyroidism, chronic respiratory failure on oxygen, COPD, chronic diastolic dysfunction, A. fib not on any anticoagulation secondary to chronic anemia, recent pubic rami fracture. The patient is presenting with abnormal hemoglobin on routine labs. The patient was recently admitted in the hospital after a mechanical fall which led to multiple pubic rami fracture and mild hematoma. Patient was anticoagulation at time of the fall. Multiple Repeat H&H in the hospital remained stable and patient was taken off of anticoagulation at the time of discharge. Patient has been having progressively declining course in the nursing home. She has been complaining of generalized fatigue and weakness also had some increasing shortness of breath. They repeated an H&H which was showing 6.7 hemoglobin and therefore they sent her to the ER for further workup. Repeat H&H was 5.3. Patient the time of my evaluation denies any complaints of shortness of breath chest pain abdominal pain nausea or vomiting. She complains of chronic constipation. She denies any burning urination but mentions has been having difficulty urinating. She denies any focal deficit. She has chronic rash and her pain is stable.  The patient is coming from home. And at her baseline independent for most of her ADL.  Review of Systems: as mentioned in the history of present illness.  A comprehensive review of the other systems is negative.  Past Medical History  Diagnosis Date  . Rash     Zebedee IbaHaley Haley rash  . Anxiety   . Thyroid disease   . Hyperlipidemia   . Hypertension   . COPD (chronic obstructive  pulmonary disease)   . Poor historian   . Carotid artery disease   . Atrial fibrillation 02/11/2015  . Chronic respiratory failure with hypoxia 02/11/2015  . 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 Back surgery     Social History:  reports that she has never smoked. She does not have any smokeless tobacco history on file. She reports that she does not drink alcohol or use illicit drugs.  Allergies  Allergen Reactions  . Ambien [Zolpidem Tartrate]   . Sulfa Antibiotics Nausea And Vomiting  . Clindamycin/Lincomycin Swelling and Rash    Family History  Problem Relation Age of Onset  . Cancer Father   . Stroke Mother     Prior to Admission medications   Medication Sig Start Date End Date Taking? Authorizing Provider  atorvastatin (LIPITOR) 20 MG tablet Take 20 mg by mouth daily.    Historical Provider, MD  cetirizine (ZYRTEC) 10 MG tablet Take 10 mg by mouth 2 (two) times daily.     Historical Provider, MD  ciprofloxacin (CIPRO) 500 MG tablet Take 500 mg by mouth 2 (two) times daily.    Historical Provider, MD  citalopram (CELEXA) 20 MG tablet Take 20 mg by mouth daily.    Historical Provider, MD  diltiazem (CARDIZEM CD) 120 MG 24 hr capsule Take 1 capsule (120 mg total) by mouth 2 (two) times daily. 02/17/15   Erick BlinksJehanzeb Memon, MD  diphenhydrAMINE (BENADRYL) 25 mg capsule Take 25 mg by mouth every 8 (eight) hours as needed.    Historical Provider, MD  ferrous sulfate 325 (65 FE)  MG tablet Take 1 tablet (325 mg total) by mouth 2 (two) times daily with a meal. 03/25/15   Penny Pia, MD  folic acid (FOLVITE) 1 MG tablet Take 1 mg by mouth daily.    Historical Provider, MD  furosemide (LASIX) 20 MG tablet Take 1 tablet (20 mg total) by mouth every other day. Alternate with 40 mg 03/27/15   Penny Pia, MD  HYDROcodone-acetaminophen (NORCO) 7.5-325 MG per tablet Take 1 tablet by mouth every 4 (four) hours as needed for moderate pain. 03/25/15    Penny Pia, MD  levalbuterol Pauline Aus) 0.63 MG/3ML nebulizer solution Take 3 mLs (0.63 mg total) by nebulization every 6 (six) hours as needed for wheezing or shortness of breath. 02/17/15   Erick Blinks, MD  levothyroxine (SYNTHROID, LEVOTHROID) 100 MCG tablet Take 100 mcg by mouth daily before breakfast.    Historical Provider, MD  LORazepam (ATIVAN) 0.5 MG tablet Take 1 tablet (0.5 mg total) by mouth 2 (two) times daily as needed for anxiety. 03/25/15   Penny Pia, MD  methotrexate 2.5 MG tablet Take 10 mg by mouth once a week. On Sunday.    Historical Provider, MD  metoprolol (LOPRESSOR) 25 MG tablet Take 1 tablet (25 mg total) by mouth 2 (two) times daily. 03/25/15   Penny Pia, MD  minocycline (MINOCIN,DYNACIN) 100 MG capsule Take 100 mg by mouth 2 (two) times daily.    Historical Provider, MD  traZODone (DESYREL) 150 MG tablet Take 150 mg by mouth at bedtime.     Historical Provider, MD    Physical Exam: Filed Vitals:   04/05/15 2000 04/05/15 2030 04/05/15 2100 04/05/15 2118  BP: 97/38 97/46 102/37 106/58  Pulse: 67 75 75 71  Temp:    98.1 F (36.7 C)  TempSrc:    Oral  Resp: SpO2: 100% 98% 100% 99%    General: Alert, Awake and Oriented to Time, Place and Person. Appear in mild distress Eyes: PERRL ENT: Oral Mucosa clear moist,pallor. Neck: Difficult to assess  JVD Cardiovascular: S1 and S2 Present, aortic systolic Murmur, Peripheral Pulses Present Respiratory: Bilateral Air entry equal and Decreased,  Faint basal  Crackles, no wheezes Abdomen: Bowel Sound present, Soft and non- tender Skin: Diffuse macular papular Rash Extremities: trace Pedal edema, no calf tenderness Tenderness of the right hip  Neurologic: Grossly no focal neuro deficit.  Labs on Admission:  CBC:  Recent Labs Lab 04/05/15 1900  WBC 9.9  NEUTROABS 6.6  HGB 5.3*  HCT 17.5*  MCV 96.2  PLT 270    CMP     Component Value Date/Time   NA 137 04/05/2015 1900   K 3.6 04/05/2015  1900   CL 100* 04/05/2015 1900   CO2 27 04/05/2015 1900   GLUCOSE 107* 04/05/2015 1900   BUN 25* 04/05/2015 1900   CREATININE 1.15* 04/05/2015 1900   CALCIUM 8.2* 04/05/2015 1900   PROT 4.3* 03/24/2015 0313   ALBUMIN 1.5* 03/24/2015 0313   AST 21 03/24/2015 0313   ALT 14 03/24/2015 0313   ALKPHOS 75 03/24/2015 0313   BILITOT 0.4 03/24/2015 0313   GFRNONAA 44* 04/05/2015 1900   GFRAA 51* 04/05/2015 1900    No results for input(s): LIPASE, AMYLASE in the last 168 hours.  No results for input(s): CKTOTAL, CKMB, CKMBINDEX, TROPONINI in the last 168 hours. BNP (last 3 results)  Recent Labs  12/29/14 1258 02/09/15 1030  BNP 593.0* 676.0*    ProBNP (last 3 results) No results for  input(s): PROBNP in the last 8760 hours.   Radiological Exams on Admission: No results found. EKG: Independently reviewed. normal sinus rhythm, nonspecific ST and T waves changes.  Assessment/Plan Principal Problem:   Symptomatic anemia Active Problems:   COPD (chronic obstructive pulmonary disease)   Atrial fibrillation   Chronic respiratory failure with hypoxia   Fracture of multiple pubic rami   Chronic diastolic CHF (congestive heart failure)   CKD (chronic kidney disease)   [name]   1. Symptomatic anemia The patient is presenting with complaints of shortness of breath and fatigue with hemoglobin 5.3. She does not have any diarrhea and her Hemoccult is negative. She has chronic anemia and on iron supplements. She also has chronically low vitamin B-12 levels. At present she is receiving 2 units of PRBC. I would open a stat CT abdomen pelvis to rule out any pelvic fluid. If there is any further worsening of H&H or if the CT scan is positive for significant hematoma we will consult intervention radiology. Patient will be admitted to step down unit for close monitoring. She remains nothing by mouth. Will use Protonix twice a day.  2. A. fib, chronic diastolic heart  failure. We will give her intermittent doses of Lasix between the transfusion. Monitor ins and outs. Not on anticoagulation due to anemia and hematoma. Continue rate control medications from tomorrow. At present monitoring closely as she is mildly hypotensive.  3. Chronic anemia. Patient has iron deficiency. B-12 level is borderline. We'll check MMA, and other studies to see adequate replenishment with supplementation of scheduled iron.  4.COPD, chronic CHF, chronic respiratory failure. Continue oxygen. Continue inhalers.  5.Chronic rash.  continuing chronic Cipro and minocyclin  Advance goals of care discussion: DNR/DNI    DVT Prophylaxis: mechanical compression device  Nutrition: npo  Family Communication: attempt to reach the family on the phone given in demographics were unsuccessful  Disposition: Admitted as inpatient, step-down unit.  Author: Lynden Oxford, MD [facility] Pager: [phone] 04/05/2015   Addendum: Ct Abdomen Pelvis W Contrast  04/05/2015   CLINICAL DATA:  Decreased hemoglobin/hematocrit.  81yof, HPI: GIOIA RANES is a 81 y.o. female with Past medical history of chronic rash, hypothyroidism, chronic respiratory failure on oxygen, COPD, chronic diastolic dysfunction, A. fib not on any anticoagulation secondary to chronic anemia, recent pubic rami fracture.The patient is presenting with abnormal hemoglobin on routine labs.The patient was recently admitted in the hospital after a mechanical fall which led to multiple pubic rami fracture and mild hematoma.Patient was anticoagulation at time of the fall.Multiple Repeat H&H in the hospital remained stable and patient was taken off of anticoagulation at the time of discharge.Patient has been having progressively declining course in the nursing home.She has been complaining of generalized fatigue and weakness also had some increasing shortness of breath.They repeated an H&H which was showing 6.7 hemoglobin  and therefore they sent her to the ER for further workup.Repeat H&H was 5.3.Rash; Anxiety; Thyroid disease; Hyperlipidemia; COPD (chronic obstructive pulmonary disease); Poor historian; Carotid artery disease; Atrial fibrillation; Chronic respiratory failure with hypoxia; [name].  EXAM: CT ABDOMEN AND PELVIS WITH CONTRAST  TECHNIQUE: Multidetector CT imaging of the abdomen and pelvis was performed using the standard protocol following bolus administration of intravenous contrast.  CONTRAST:  OMNIPAQUE IOHEXOL 300 MG/ML  SOLN  COMPARISON:  CT pelvis 03/23/2015  FINDINGS: Minimal right pleural effusion with atelectasis or consolidation in the right lung base. Nodular infiltration in the right lung base centrally measures 2.3 cm. Given  the presence of additional consolidation in the right lung, this is likely inflammatory but the appearance is somewhat spiculated and followup in 3 months is suggested to exclude an underlying pulmonary nodule. Small esophageal hiatal hernia. Coronary artery calcifications.  The liver, spleen, gallbladder, pancreas, adrenal glands, inferior vena cava, and retroperitoneal lymph nodes are unremarkable. There is diffuse calcification of the abdominal aorta with probable significant narrowing of the lower abdominal aorta and iliac arteries. Prominent calcification at the origins of the superior mesenteric artery and both renal arteries probably also represents stenosis. The left kidney is diffusely atrophic, suggesting probable renal artery stenosis. Delayed nephrogram on the left. No hydronephrosis in either kidney. Stomach, small bowel, and colon are not abnormally distended. Contrast material flows through to the colon without evidence of bowel obstruction. Stool-filled colon. No free air or free fluid in the abdomen.  Pelvis: Uterus and ovaries are not enlarged. Bladder wall is not thickened. Appendix is normal. Stool-filled rectosigmoid colon without inflammatory  change. No pelvic mass or lymphadenopathy. Again demonstrated, there are comminuted fractures of the right superior and inferior pubic rami. Nondisplaced fracture right sacral ala. No evidence of significant hematoma. Degenerative changes in the lumbar spine.  IMPRESSION: Comminuted displaced fractures of the right superior and inferior pubic rami. No hematoma demonstrated in the abdomen or pelvis. Extensive vascular calcification in the aorta and branch vessels including the origins of the superior mesenteric artery and renal arteries bilaterally. Diffuse left renal atrophy probably indicates renal artery stenosis. Small right pleural effusion with infiltration in the right lung base probably inflammatory. Focal nodular infiltration in the right lung base is probably inflammatory but three-month follow-up is recommended to exclude underlying pulmonary nodule.   Electronically Signed   By: Burman NievesWilliam  Stevens M.D.   On: 04/05/2015 22:39   CT does not show any signs of hematoma. Pt status changed to telemetry. Diet changed to clear liquid diet Most likely poor bone marrow function rather than any active bleeding as reticulocytes not elevated.  PATEL, PRANAV 11:24 PM 04/05/2015    If 7PM-7AM, please contact night-coverage [email or web address] Password [email or web address]

## 2015-04-05 NOTE — ED Provider Notes (Signed)
81 year old female, recent admission to the hospital for pubic rami fractures, also found to have a small amount of pelvic hemorrhage, she has been in a rehabilitation facility since that time and reports feeling more generally weak and feeling more short of breath. She denies chest pain abdominal pain, headache and has not had a BM in several days - some difficulty passing urine as well.  Soft NT abd, clear lungs and heart, pale mucous membranes, clear speech, rash under teh breasts bilaterally which is chronic.  The pt has severe anemia and ha had borderline tachycardia in th eED = she has required transfusion of RBC's in the ED, significant intervention given life threatening anemia.   CRITICAL CARE Performed by: Vida RollerMILLER,Gwenlyn Hottinger D Total critical care time: 35 Critical care time was exclusive of separately billable procedures and treating other patients. Critical care was necessary to treat or prevent imminent or life-threatening deterioration. Critical care was time spent personally by me on the following activities: development of treatment plan with patient and/or surrogate as well as nursing, discussions with consultants, evaluation of patient's response to treatment, examination of patient, obtaining history from patient or surrogate, ordering and performing treatments and interventions, ordering and review of laboratory studies, ordering and review of radiographic studies, pulse oximetry and re-evaluation of patient's condition.  Check CXR, labs - found to be severely anemic - though there has been a gradual decline since pre fall.   Imaging without findings of increased pelvic hemorrhage. Will be admitted by IM team.   I saw and evaluated the patient, reviewed the resident's note and I agree with the findings and plan.   Final diagnoses:  Symptomatic anemia  Severe Anemia     Eber HongBrian Kewan Mcnease, MD 04/06/15 2154

## 2015-04-05 NOTE — ED Notes (Signed)
Report attempted 

## 2015-04-05 NOTE — ED Notes (Signed)
Consent for blood administration obtained

## 2015-04-05 NOTE — ED Provider Notes (Signed)
CSN: [id number]     Arrival date & time 04/05/15  1830 History   First MD Initiated Contact with Patient 04/05/15 1831     Chief Complaint  Patient presents with  . Abnormal Lab     (Consider location/radiation/quality/duration/timing/severity/associated sxs/prior Treatment) HPI Comments: 81 year old female presents from skilled nursing facility with low hemoglobin. Patient was recently admitted approximately 2 weeks ago with a pelvic fracture. Since then has been in a skilled nursing facility. Per report she's felt diffusely weak for several days. Lab draw today at nursing facility showed hemoglobin of 6.7.  Patient is a 81 y.o. female presenting with weakness.  Weakness This is a new problem. The current episode started in the past 7 days. The problem has been unchanged. Associated symptoms include weakness. Pertinent negatives include no abdominal pain, chest pain, congestion, coughing, fever or rash. Nothing aggravates the symptoms. She has tried nothing for the symptoms. The treatment provided no relief.    Past Medical History  Diagnosis Date  . Rash     Zebedee IbaHaley Haley rash  . Anxiety   . Thyroid disease   . Hyperlipidemia   . Hypertension   . COPD (chronic obstructive pulmonary disease)   . Poor historian   . Carotid artery disease   . Atrial fibrillation 02/11/2015  . Chronic respiratory failure with hypoxia 02/11/2015  . 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 Collagen vascular disease   . CHF (congestive heart failure)    Past Surgical History  Procedure Laterality Date  . Back surgery     Family History  Problem Relation Age of Onset  . Cancer Father   . Stroke Mother    History  Substance Use Topics  . Smoking status: Never Smoker   . Smokeless tobacco: Not on file  . Alcohol Use: No   OB History    No data available     Review of Systems  Constitutional: Negative for fever.  HENT: Negative for congestion.   Respiratory: Negative for cough.   Cardiovascular:  Negative for chest pain.  Gastrointestinal: Negative for abdominal pain.  Musculoskeletal:       Hip pain at fracture site   Skin: Negative for rash.  Neurological: Positive for weakness.  All other systems reviewed and are negative.     Allergies  Ambien; Sulfa antibiotics; and Clindamycin/lincomycin  Home Medications   Prior to Admission medications   Medication Sig Start Date End Date Taking? Authorizing Provider  atorvastatin (LIPITOR) 20 MG tablet Take 20 mg by mouth daily.   Yes Historical Provider, MD  cetirizine (ZYRTEC) 10 MG tablet Take 10 mg by mouth 2 (two) times daily.    Yes Historical Provider, MD  ciprofloxacin (CIPRO) 500 MG tablet Take 500 mg by mouth 2 (two) times daily. MAR indicates she takes it everyday. No start or end date   Yes Historical Provider, MD  citalopram (CELEXA) 20 MG tablet Take 20 mg by mouth daily.   Yes Historical Provider, MD  diltiazem (CARDIZEM CD) 120 MG 24 hr capsule Take 1 capsule (120 mg total) by mouth 2 (two) times daily. 02/17/15  Yes Erick BlinksJehanzeb Memon, MD  diphenhydrAMINE (BENADRYL) 25 mg capsule Take 25 mg by mouth every 8 (eight) hours as needed.   Yes Historical Provider, MD  fentaNYL (DURAGESIC - DOSED MCG/HR) 12 MCG/HR Place 12.5 mcg onto the skin every 3 (three) days.   Yes Historical Provider, MD  ferrous sulfate 325 (65 FE) MG tablet Take 1 tablet (325 mg total) by mouth 2 (  two) times daily with a meal. 03/25/15  Yes Penny Piarlando Vega, MD  folic acid (FOLVITE) 1 MG tablet Take 1 mg by mouth daily.   Yes Historical Provider, MD  furosemide (LASIX) 20 MG tablet Take 1 tablet (20 mg total) by mouth every other day. Alternate with 40 mg 03/27/15  Yes Penny Piarlando Vega, MD  levalbuterol [facility](XOPENEX) 0.63 MG/3ML nebulizer solution Take 3 mLs (0.63 mg total) by nebulization every 6 (six) hours as needed for wheezing or shortness of breath. 02/17/15  Yes Erick BlinksJehanzeb Memon, MD  levothyroxine (SYNTHROID, LEVOTHROID) 112 MCG tablet Take 112 mcg by mouth daily  before breakfast.   Yes Historical Provider, MD  LORazepam (ATIVAN) 0.5 MG tablet Take 1 tablet (0.5 mg total) by mouth 2 (two) times daily as needed for anxiety. 03/25/15  Yes Penny Piarlando Vega, MD  methotrexate 2.5 MG tablet Take 10 mg by mouth once a week. On Sunday.   Yes Historical Provider, MD  metoprolol (LOPRESSOR) 25 MG tablet Take 1 tablet (25 mg total) by mouth 2 (two) times daily. 03/25/15  Yes Penny Piarlando Vega, MD  minocycline (MINOCIN,DYNACIN) 100 MG capsule Take 100 mg by mouth 2 (two) times daily.   Yes Historical Provider, MD  traZODone (DESYREL) 150 MG tablet Take 150 mg by mouth at bedtime.    Yes Historical Provider, MD  Vitamin D, Ergocalciferol, (DRISDOL) 50000 UNITS CAPS capsule Take 50,000 Units by mouth every 7 (seven) days. Take on thursdays   Yes Historical Provider, MD   BP 111/58 mmHg  Pulse 76  Temp(Src) 98.6 F (37 C) (Oral)  Resp 19  SpO2 100% Physical Exam  Constitutional: She is oriented to person, place, and time. She appears well-developed.  HENT:  Head: Normocephalic.  Neck: Normal range of motion.  Cardiovascular: Normal rate.   Pulmonary/Chest: Effort normal.  Abdominal: Soft. She exhibits no distension.  Musculoskeletal: She exhibits no edema.  ttp R hip    Neurological: She is alert and oriented to person, place, and time.  Skin: No rash noted.  Back and buttock with diffuse skin breakdown which patient states she has had for some time. No active bleeding or purulent appearing areas   Vitals reviewed.   ED Course  Procedures (including critical care time) Labs Review Labs Reviewed  CBC WITH DIFFERENTIAL/PLATELET - Abnormal; Notable for the following:    RBC 1.82 (*)    Hemoglobin 5.3 (*)    HCT 17.5 (*)    RDW 16.0 (*)    All other components within normal limits  BASIC METABOLIC PANEL - Abnormal; Notable for the following:    Chloride 100 (*)    Glucose, Bld 107 (*)    BUN 25 (*)    Creatinine, Ser 1.15 (*)    Calcium 8.2 (*)    GFR calc  non Af Amer 44 (*)    GFR calc Af Amer 51 (*)    All other components within normal limits  RETICULOCYTES - Abnormal; Notable for the following:    RBC. 2.52 (*)    All other components within normal limits  IRON AND TIBC - Abnormal; Notable for the following:    Iron 21 (*)    TIBC 197 (*)    All other components within normal limits  LACTATE DEHYDROGENASE - Abnormal; Notable for the following:    LDH 211 (*)    All other components within normal limits  FERRITIN  VITAMIN B12  URINALYSIS, ROUTINE W REFLEX MICROSCOPIC  HAPTOGLOBIN  FOLATE  METHYLMALONIC ACID(MMA), RND URINE  POC OCCULT BLOOD, ED  TYPE AND SCREEN  PREPARE RBC (CROSSMATCH)    Imaging Review Ct Abdomen Pelvis W Contrast  04/05/2015   CLINICAL DATA:  Decreased hemoglobin/hematocrit.  81yof, HPI: MUNIRA POLSON is a 81 y.o. female with Past medical history of chronic rash, hypothyroidism, chronic respiratory failure on oxygen, COPD, chronic diastolic dysfunction, A. fib not on any anticoagulation secondary to chronic anemia, recent pubic rami fracture.The patient is presenting with abnormal hemoglobin on routine labs.The patient was recently admitted in the hospital after a mechanical fall which led to multiple pubic rami fracture and mild hematoma.Patient was anticoagulation at time of the fall.Multiple Repeat H&H in the hospital remained stable and patient was taken off of anticoagulation at the time of discharge.Patient has been having progressively declining course in the nursing home.She has been complaining of generalized fatigue and weakness also had some increasing shortness of breath.They repeated an H&H which was showing 6.7 hemoglobin and therefore they sent her to the ER for further workup.Repeat H&H was 5.3.Rash; Anxiety; Thyroid disease; Hyperlipidemia; COPD (chronic obstructive pulmonary disease); Poor historian; Carotid artery disease; Atrial fibrillation; Chronic respiratory failure with hypoxia; Hailey-hailey  disease.  EXAM: CT ABDOMEN AND PELVIS WITH CONTRAST  TECHNIQUE: Multidetector CT imaging of the abdomen and pelvis was performed using the standard protocol following bolus administration of intravenous contrast.  CONTRAST:  OMNIPAQUE IOHEXOL 300 MG/ML  SOLN  COMPARISON:  CT pelvis 03/23/2015  FINDINGS: Minimal right pleural effusion with atelectasis or consolidation in the right lung base. Nodular infiltration in the right lung base centrally measures 2.3 cm. Given the presence of additional consolidation in the right lung, this is likely inflammatory but the appearance is somewhat spiculated and followup in 3 months is suggested to exclude an underlying pulmonary nodule. Small esophageal hiatal hernia. Coronary artery calcifications.  The liver, spleen, gallbladder, pancreas, adrenal glands, inferior vena cava, and retroperitoneal lymph nodes are unremarkable. There is diffuse calcification of the abdominal aorta with probable significant narrowing of the lower abdominal aorta and iliac arteries. Prominent calcification at the origins of the superior mesenteric artery and both renal arteries probably also represents stenosis. The left kidney is diffusely atrophic, suggesting probable renal artery stenosis. Delayed nephrogram on the left. No hydronephrosis in either kidney. Stomach, small bowel, and colon are not abnormally distended. Contrast material flows through to the colon without evidence of bowel obstruction. Stool-filled colon. No free air or free fluid in the abdomen.  Pelvis: Uterus and ovaries are not enlarged. Bladder wall is not thickened. Appendix is normal. Stool-filled rectosigmoid colon without inflammatory change. No pelvic mass or lymphadenopathy. Again demonstrated, there are comminuted fractures of the right superior and inferior pubic rami. Nondisplaced fracture right sacral ala. No evidence of significant hematoma. Degenerative changes in the lumbar spine.  IMPRESSION: Comminuted  displaced fractures of the right superior and inferior pubic rami. No hematoma demonstrated in the abdomen or pelvis. Extensive vascular calcification in the aorta and branch vessels including the origins of the superior mesenteric artery and renal arteries bilaterally. Diffuse left renal atrophy probably indicates renal artery stenosis. Small right pleural effusion with infiltration in the right lung base probably inflammatory. Focal nodular infiltration in the right lung base is probably inflammatory but three-month follow-up is recommended to exclude underlying pulmonary nodule.   Electronically Signed   By: Burman Nieves M.D.   On: 04/05/2015 22:39     EKG Interpretation None      MDM  81 year old female presents with several days of  weakness and outside lab showing hemoglobin 7.8. On arrival today patient is hemodynamically stable however's complaining of some fatigue. Labs obtain significant for hemoglobin of 5.3. Consent was obtained from patient for blood products. 2 units packed red cells transfused. No complaints of Dark stools. Guaiac negative. Patient did have a previous hip fracture several weeks ago with small hematoma on CT scan at that time. Additional CT done now to ensure there is no larger hematoma. This read demonstrated comminuted pelvic fractures however no large hematomas. Unclear source of blood loss this time. Consult internal medicine. Patient admitted. Remain stable in the ED with no further acute issues Final diagnoses:  Symptomatic anemia        Bridgett Larsson, MD 04/05/15 2312  Eber Hong, MD 04/06/15 2152

## 2015-04-06 LAB — URINALYSIS, ROUTINE W REFLEX MICROSCOPIC
Bilirubin Urine: NEGATIVE
Glucose, UA: NEGATIVE mg/dL
Hgb urine dipstick: NEGATIVE
Ketones, ur: NEGATIVE mg/dL
NITRITE: NEGATIVE
PH: 5.5 (ref 5.0–8.0)
Protein, ur: 30 mg/dL — AB
Specific Gravity, Urine: 1.029 (ref 1.005–1.030)
UROBILINOGEN UA: 0.2 mg/dL (ref 0.0–1.0)

## 2015-04-06 LAB — CBC WITH DIFFERENTIAL/PLATELET
Basophils Absolute: 0 10*3/uL (ref 0.0–0.1)
Basophils Relative: 0 % (ref 0–1)
EOS PCT: 6 % — AB (ref 0–5)
Eosinophils Absolute: 0.4 10*3/uL (ref 0.0–0.7)
HCT: 29.3 % — ABNORMAL LOW (ref 36.0–46.0)
HEMOGLOBIN: 9.5 g/dL — AB (ref 12.0–15.0)
Lymphocytes Relative: 34 % (ref 12–46)
Lymphs Abs: 2.6 10*3/uL (ref 0.7–4.0)
MCH: 29 pg (ref 26.0–34.0)
MCHC: 32.4 g/dL (ref 30.0–36.0)
MCV: 89.3 fL (ref 78.0–100.0)
MONOS PCT: 5 % (ref 3–12)
Monocytes Absolute: 0.4 10*3/uL (ref 0.1–1.0)
Neutro Abs: 4.2 10*3/uL (ref 1.7–7.7)
Neutrophils Relative %: 55 % (ref 43–77)
Platelets: 221 10*3/uL (ref 150–400)
RBC: 3.28 MIL/uL — ABNORMAL LOW (ref 3.87–5.11)
RDW: 17.5 % — AB (ref 11.5–15.5)
WBC: 7.6 10*3/uL (ref 4.0–10.5)

## 2015-04-06 LAB — COMPREHENSIVE METABOLIC PANEL
ALK PHOS: 113 U/L (ref 38–126)
ALT: 12 U/L — ABNORMAL LOW (ref 14–54)
ANION GAP: 8 (ref 5–15)
AST: 20 U/L (ref 15–41)
Albumin: 1.7 g/dL — ABNORMAL LOW (ref 3.5–5.0)
BUN: 19 mg/dL (ref 6–20)
CO2: 27 mmol/L (ref 22–32)
Calcium: 8.2 mg/dL — ABNORMAL LOW (ref 8.9–10.3)
Chloride: 103 mmol/L (ref 101–111)
Creatinine, Ser: 1.06 mg/dL — ABNORMAL HIGH (ref 0.44–1.00)
GFR calc non Af Amer: 49 mL/min — ABNORMAL LOW (ref >60)
GFR, EST AFRICAN AMERICAN: 56 mL/min — AB (ref >60)
GLUCOSE: 81 mg/dL (ref 65–99)
Potassium: 4 mmol/L (ref 3.5–5.1)
Sodium: 138 mmol/L (ref 135–145)
Total Bilirubin: 0.6 mg/dL (ref 0.3–1.2)
Total Protein: 5.3 g/dL — ABNORMAL LOW (ref 6.5–8.1)

## 2015-04-06 LAB — URINE MICROSCOPIC-ADD ON

## 2015-04-06 MED ORDER — CHLORHEXIDINE GLUCONATE CLOTH 2 % EX PADS
6.0000 | MEDICATED_PAD | Freq: Every day | CUTANEOUS | Status: DC
  Administered 2015-04-07 – 2015-04-10 (×3): 6 via TOPICAL

## 2015-04-06 MED ORDER — MUPIROCIN 2 % EX OINT
1.0000 "application " | TOPICAL_OINTMENT | Freq: Two times a day (BID) | CUTANEOUS | Status: DC
  Administered 2015-04-06 – 2015-04-10 (×9): 1 via NASAL
  Filled 2015-04-06: qty 22

## 2015-04-06 MED ORDER — ALBUTEROL SULFATE (2.5 MG/3ML) 0.083% IN NEBU: 2.5000 mg | INHALATION_SOLUTION | Freq: Four times a day (QID) | RESPIRATORY_TRACT | Status: DC | PRN

## 2015-04-06 MED ORDER — HYDRALAZINE HCL 20 MG/ML IJ SOLN
5.0000 mg | Freq: Four times a day (QID) | INTRAMUSCULAR | Status: DC | PRN
  Administered 2015-04-06: 5 mg via INTRAVENOUS
  Filled 2015-04-06: qty 1

## 2015-04-06 NOTE — Progress Notes (Signed)
[facility] PROGRESS NOTE  Sierra Singleton:[id number] DOB: [birth year] DOA: 04/05/2015 PCP: Ignatius Specking., MD  Assessment/Plan: Principal Problem:   Symptomatic anemia - Improved after transfusion - On iron replacement as patient has history of iron deficiency. - Most likely secondary to pubic rami fractures with small amount of pelvic hemorrhage in patient with iron deficiency anemia. - Continue to monitor CBC levels  Active Problems:   COPD (chronic obstructive pulmonary disease) - Compensated currently, continue as needed albuterol    Atrial fibrillation - Continue Cardizem    Fracture of multiple pubic rami -We'll obtain physical therapy evaluation    Chronic diastolic CHF (congestive heart failure) -Compensated currently continue current regimen    CKD (chronic kidney disease) -Stable. Continue to monitor serum creatinine    [name] -Continue home medication regimen and wound care consult  Code Status: DO NOT RESUSCITATE Family Communication: None at bedside Disposition Plan: Once hemoglobin level steady for > 24 hours   Consultants:  WOC  Procedures:  none  Antibiotics:  Minocycline  cipro  HPI/Subjective: Pt has no new complaints. Feeling better.  Objective: Filed Vitals:   04/06/15 0851  BP: 185/55  Pulse: 103  Temp: 97.7 F (36.5 C)  Resp: 18    Intake/Output Summary (Last 24 hours) at 04/06/15 1551 Last data filed at 04/06/15 1415  Gross per 24 hour  Intake    975 ml  Output   1250 ml  Net   -275 ml   Filed Weights   04/06/15 0047  Weight: 78.427 kg (172 lb 14.4 oz)    Exam:   General:  Patient in no acute distress, alert and awake  Cardiovascular: Regular rate and rhythm, no murmurs or rubs  Respiratory: Clear to auscultation bilaterally, no wheezes  Abdomen: Soft, nondistended, nontender  Musculoskeletal: No cyanosis or clubbing   Data Reviewed: Basic Metabolic Panel:  Recent Labs Lab  04/05/15 1900 04/06/15 0832  NA 137 138  K 3.6 4.0  CL 100* 103  CO2 27 27  GLUCOSE 107* 81  BUN 25* 19  CREATININE 1.15* 1.06*  CALCIUM 8.2* 8.2*   Liver Function Tests:  Recent Labs Lab 04/06/15 0832  AST 20  ALT 12*  ALKPHOS 113  BILITOT 0.6  PROT 5.3*  ALBUMIN 1.7*   No results for input(s): LIPASE, AMYLASE in the last 168 hours. No results for input(s): AMMONIA in the last 168 hours. CBC:  Recent Labs Lab 04/05/15 1900 04/06/15 0832  WBC 9.9 7.6  NEUTROABS 6.6 4.2  HGB 5.3* 9.5*  HCT 17.5* 29.3*  MCV 96.2 89.3  PLT 270 221   Cardiac Enzymes: No results for input(s): CKTOTAL, CKMB, CKMBINDEX, TROPONINI in the last 168 hours. BNP (last 3 results)  Recent Labs  12/29/14 1258 02/09/15 1030  BNP 593.0* 676.0*    ProBNP (last 3 results) No results for input(s): PROBNP in the last 8760 hours.  CBG: No results for input(s): GLUCAP in the last 168 hours.  No results found for this or any previous visit (from the past 240 hour(s)).   Studies: Ct Abdomen Pelvis W Contrast  04/05/2015   CLINICAL DATA:  Decreased hemoglobin/hematocrit.  81yof, HPI: Sierra Singleton is a 81 y.o. female with Past medical history of chronic rash, hypothyroidism, chronic respiratory failure on oxygen, COPD, chronic diastolic dysfunction, A. fib not on any anticoagulation secondary to chronic anemia, recent pubic rami fracture.The patient is presenting with abnormal hemoglobin on routine labs.The patient was recently admitted in the hospital after  a mechanical fall which led to multiple pubic rami fracture and mild hematoma.Patient was anticoagulation at time of the fall.Multiple Repeat H&H in the hospital remained stable and patient was taken off of anticoagulation at the time of discharge.Patient has been having progressively declining course in the nursing home.She has been complaining of generalized fatigue and weakness also had some increasing shortness of breath.They repeated an  H&H which was showing 6.7 hemoglobin and therefore they sent her to the ER for further workup.Repeat H&H was 5.3.Rash; Anxiety; Thyroid disease; Hyperlipidemia; COPD (chronic obstructive pulmonary disease); Poor historian; Carotid artery disease; Atrial fibrillation; Chronic respiratory failure with hypoxia; [name].  EXAM: CT ABDOMEN AND PELVIS WITH CONTRAST  TECHNIQUE: Multidetector CT imaging of the abdomen and pelvis was performed using the standard protocol following bolus administration of intravenous contrast.  CONTRAST:  100mL OMNIPAQUE IOHEXOL 300 MG/ML  SOLN  COMPARISON:  CT pelvis 03/23/2015  FINDINGS: Minimal right pleural effusion with atelectasis or consolidation in the right lung base. Nodular infiltration in the right lung base centrally measures 2.3 cm. Given the presence of additional consolidation in the right lung, this is likely inflammatory but the appearance is somewhat spiculated and followup in 3 months is suggested to exclude an underlying pulmonary nodule. Small esophageal hiatal hernia. Coronary artery calcifications.  The liver, spleen, gallbladder, pancreas, adrenal glands, inferior vena cava, and retroperitoneal lymph nodes are unremarkable. There is diffuse calcification of the abdominal aorta with probable significant narrowing of the lower abdominal aorta and iliac arteries. Prominent calcification at the origins of the superior mesenteric artery and both renal arteries probably also represents stenosis. The left kidney is diffusely atrophic, suggesting probable renal artery stenosis. Delayed nephrogram on the left. No hydronephrosis in either kidney. Stomach, small bowel, and colon are not abnormally distended. Contrast material flows through to the colon without evidence of bowel obstruction. Stool-filled colon. No free air or free fluid in the abdomen.  Pelvis: Uterus and ovaries are not enlarged. Bladder wall is not thickened. Appendix is normal. Stool-filled  rectosigmoid colon without inflammatory change. No pelvic mass or lymphadenopathy. Again demonstrated, there are comminuted fractures of the right superior and inferior pubic rami. Nondisplaced fracture right sacral ala. No evidence of significant hematoma. Degenerative changes in the lumbar spine.  IMPRESSION: Comminuted displaced fractures of the right superior and inferior pubic rami. No hematoma demonstrated in the abdomen or pelvis. Extensive vascular calcification in the aorta and branch vessels including the origins of the superior mesenteric artery and renal arteries bilaterally. Diffuse left renal atrophy probably indicates renal artery stenosis. Small right pleural effusion with infiltration in the right lung base probably inflammatory. Focal nodular infiltration in the right lung base is probably inflammatory but three-month follow-up is recommended to exclude underlying pulmonary nodule.   Electronically Signed   By: Burman NievesWilliam  Stevens M.D.   On: 04/05/2015 22:39    Scheduled Meds: . sodium chloride   Intravenous Once  . atorvastatin  20 mg Oral Daily  . Chlorhexidine Gluconate Cloth  6 each Topical Q0600  . ciprofloxacin  500 mg Oral BID  . citalopram  20 mg Oral Daily  . diltiazem  120 mg Oral BID  . ferrous sulfate  325 mg Oral BID WC  . folic acid  1 mg Oral Daily  . levothyroxine  100 mcg Oral QAC breakfast  . minocycline  100 mg Oral BID  . mupirocin ointment  1 application Nasal BID  . sodium chloride  3 mL Intravenous Q12H  . traZODone  150 mg Oral QHS   Continuous Infusions:    Time spent: > 35 minutes   Penny PiaVEGA, Hoke Baer  [facility] Pager [phone] If 7PM-7AM, please contact night-coverage at [email or web address], password [email or web address] 04/06/2015, 3:51 PM  LOS: 1 day

## 2015-04-06 NOTE — Progress Notes (Signed)
Pt was put on bedpan for urination. She was unable to urinate, but she did have the urge. Bladder scan showed 519 of urine in bladder. Paged Dr. Cena BentonVega. In and out cath completed. Total of 1000 cc of amber colored urine obtained form bladder.

## 2015-04-06 NOTE — ED Notes (Addendum)
Bladde Scan >315. Attempted to perform a in and out cath but pt stated "I have to pee and I cant hold it" so I grabbed a urinal and she emptied her bladder inside the urinal when I measured the specimen it was 150cc no complaints noted at that time

## 2015-04-06 NOTE — Consult Note (Addendum)
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  It has become much more severe during the past several months, resulting in full thickness skin loss. 50% of posterior back has full thickness skin loss, affected patchy areas are too large to measure.  All sites are red and moist and bleed easily when touched.  Mod amt yellow drainage and strong odor.  Anterior folds under abd near groin and to bilat skin folds below breasts also have patchy areas of red moist skin loss,  which is partial thickness and not as severe,in addition to red raised lesions surrounding which may be related to constant moisture or intertrigo.  All sites are very painful. Plan: Pt has been using Vaseline gauze at another facility to provide moist healing and decrease adhesion to skin.  Will continue this plan of care and add Inter dry silver impregnated fabric for breast and abd/groin skin folds.  This will wick moisture away from skin and provide antimicrobial benefits. It should be left in place 5 days for optimal plan of care.  Discussed plan of care with patient at bedside.  Will order air mattress to increase airflow to affected areas on back and decrease moisture.  No family members present to discuss topical treatment. Please consider consult to dermatology; pt has Haley-Haley disease and this complex medical condition is [facility] scope of practice.  Please re-consult if further assistance is needed.  Thank-you,  Cammie Mcgeeawn Locklan Canoy MSN, RN, CWOCN, [place] ParkWCN-AP, CNS [phone]

## 2015-04-06 NOTE — Care Management Note (Signed)
Case Management Note  Patient Details  Name: Sierra KalesShelby J Singleton MRN: [id number] Date of Birth: 1933-03-30  Subjective/Objective:      Pt admitted on 04/05/15 with symptomatic anemia.  [facility]                Action/Plan: CSW consulted to facilitate return to SNF when medically stable.  Will follow progress.    Expected Discharge Date:                  Expected Discharge Plan:  Skilled Nursing Facility  In-House Referral:  Clinical Social Work  Discharge planning Services  CM Consult  Post Acute Care Choice:    Choice offered to:     DME Arranged:    DME Agency:     HH Arranged:    HH Agency:     Status of Service:  In process, will continue to follow  Medicare Important Message Given:    Date Medicare IM Given:    Medicare IM give by:    Date Additional Medicare IM Given:    Additional Medicare Important Message give by:     If discussed at Long Length of Stay Meetings, dates discussed:    Additional Comments:  Glennon Macmerson, Vickki Igou M, RN 04/06/2015, 4:55 PM [phone]

## 2015-04-06 NOTE — Progress Notes (Signed)
   04/06/15 0047  Vitals  Temp 98 F (36.7 C)  Temp Source Oral  BP (!) 168/41 mmHg  BP Location Left Arm  BP Method Automatic  Patient Position (if appropriate) Lying  Pulse Rate 78  Pulse Rate Source Dinamap  Resp 18  Oxygen Therapy  SpO2 100 %  O2 Device Nasal Cannula  O2 Flow Rate (L/min) 2 L/min  Height and Weight  Height 5\' 2"  (1.575 m)  Weight 78.427 kg (172 lb 14.4 oz) (bedscale)  Type of Scale Used Bed  BSA (Calculated - sq m) 1.85 sq meters  BMI (Calculated) 31.7  Weight in (lb) to have BMI = 25 136.4  Admitted pt to rm 3E23 from ED via stretcher, pt oriented to room, call bell placed within reach. Pt has an existing rash and open sore to back, abdominal folds, bilateral groin and breast folds, pt refused dressing change. Admission assessment done, orders carried out. Will continue to monitor.

## 2015-04-07 LAB — TYPE AND SCREEN
ABO/RH(D): A POS
ANTIBODY SCREEN: NEGATIVE
Unit division: 0
Unit division: 0

## 2015-04-07 LAB — CBC
HEMATOCRIT: 29.6 % — AB (ref 36.0–46.0)
HEMOGLOBIN: 9.3 g/dL — AB (ref 12.0–15.0)
MCH: 28.6 pg (ref 26.0–34.0)
MCHC: 31.4 g/dL (ref 30.0–36.0)
MCV: 91.1 fL (ref 78.0–100.0)
PLATELETS: 208 10*3/uL (ref 150–400)
RBC: 3.25 MIL/uL — ABNORMAL LOW (ref 3.87–5.11)
RDW: 17.5 % — ABNORMAL HIGH (ref 11.5–15.5)
WBC: 6 10*3/uL (ref 4.0–10.5)

## 2015-04-07 LAB — BASIC METABOLIC PANEL
ANION GAP: 8 (ref 5–15)
BUN: 13 mg/dL (ref 6–20)
CO2: 29 mmol/L (ref 22–32)
Calcium: 8.4 mg/dL — ABNORMAL LOW (ref 8.9–10.3)
Chloride: 101 mmol/L (ref 101–111)
Creatinine, Ser: 0.99 mg/dL (ref 0.44–1.00)
GFR calc Af Amer: >60 mL/min (ref >60)
GFR calc non Af Amer: 53 mL/min — ABNORMAL LOW (ref >60)
Glucose, Bld: 75 mg/dL (ref 65–99)
Potassium: 4.3 mmol/L (ref 3.5–5.1)
SODIUM: 138 mmol/L (ref 135–145)

## 2015-04-07 LAB — HAPTOGLOBIN: Haptoglobin: 339 mg/dL — ABNORMAL HIGH (ref 34–200)

## 2015-04-07 MED ORDER — METOPROLOL TARTRATE 25 MG PO TABS
25.0000 mg | ORAL_TABLET | Freq: Two times a day (BID) | ORAL | Status: DC
  Administered 2015-04-07 – 2015-04-10 (×7): 25 mg via ORAL
  Filled 2015-04-07 (×8): qty 1

## 2015-04-07 MED ORDER — METOPROLOL TARTRATE 1 MG/ML IV SOLN
2.5000 mg | Freq: Once | INTRAVENOUS | Status: AC
  Administered 2015-04-07: 2.5 mg via INTRAVENOUS
  Filled 2015-04-07: qty 5

## 2015-04-07 NOTE — Progress Notes (Signed)
Pt refused to have dsg. Change done as she stated "nurse last night said she will come in this evening to do my  her dsg change."  Informed by AD that Rashida, RN will come in to nite to do.  Called place this evening,   No reply.  Message left on answering service.  Pt made aware that if she is not here oncoming nurse will do dsg.  Pt verbalized understanding.  Amanda PeaNellie Itamar Mcgowan, Charity fundraiserN.

## 2015-04-07 NOTE — Progress Notes (Signed)
Rash- Haley-Haley disease noted on patient's skin in trunk,back, and under breast area. Dressings changed as ordered which took a lengthy amount of time. Rash is draining on a certain area on the back. Drainage is either bloody or from serous to sanguinous. Rash is blister-like rash/wound with an unmeasurable circumference. Rash areas that are open and draining have a malodorous odor. Patient has noted pain with blister-like rash. Please see sticky note in reference to suggested treatment. Thanks.

## 2015-04-07 NOTE — Progress Notes (Signed)
[facility] PROGRESS NOTE  Sierra KalesShelby J Singleton WJX:[id number] DOB: [birth year] DOA: 04/05/2015 PCP: Ignatius SpeckingVYAS,DHRUV B., MD  Assessment/Plan: Principal Problem:   Symptomatic anemia - Improved after transfusion - On iron replacement as patient has history of iron deficiency. - Most likely secondary to pubic rami fractures with small amount of pelvic hemorrhage in patient with iron deficiency anemia. - Continue to monitor CBC levels. Stable at 9 after transfusion  Active Problems:   COPD (chronic obstructive pulmonary disease) - Compensated currently, continue as needed albuterol    Atrial fibrillation - Continue Cardizem    Fracture of multiple pubic rami -Physical therapy recommending SNF on discharge    Chronic diastolic CHF (congestive heart failure) -Compensated currently continue current regimen    CKD (chronic kidney disease) -Stable. 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 Feeling better. Would like to try and get into different SNF  Objective: Filed Vitals:   04/07/15 1403  BP: 153/49  Pulse: 79  Temp: 97.8 F (36.6 C)  Resp: 20    Intake/Output Summary (Last 24 hours) at 04/07/15 1827 Last data filed at 04/07/15 1403  Gross per 24 hour  Intake    843 ml  Output   1350 ml  Net   -507 ml   Filed Weights   04/06/15 0047 04/07/15 0544  Weight: 78.427 kg (172 lb 14.4 oz) 78.288 kg (172 lb 9.5 oz)    Exam:   General:  Patient in no acute distress, alert and awake  Cardiovascular: Regular rate and rhythm, no murmurs or rubs  Respiratory: Clear to auscultation bilaterally, no  wheezes  Abdomen: Soft, nondistended, nontender  Musculoskeletal: No cyanosis or clubbing   Data Reviewed: Basic Metabolic Panel:  Recent Labs Lab 04/05/15 1900 04/06/15 0832 04/07/15 0326  NA 137 138 138  K 3.6 4.0 4.3  CL 100* 103 101  CO2 27 27 29   GLUCOSE 107* 81 75  BUN 25* 19 13  CREATININE 1.15* 1.06* 0.99  CALCIUM 8.2* 8.2* 8.4*   Liver Function Tests:  Recent Labs Lab 04/06/15 0832  AST 20  ALT 12*  ALKPHOS 113  BILITOT 0.6  PROT 5.3*  ALBUMIN 1.7*   No results for input(s): LIPASE, AMYLASE in the last 168 hours. No results for input(s): AMMONIA in the last 168 hours. CBC:  Recent Labs Lab 04/05/15 1900 04/06/15 0832 04/07/15 0326  WBC 9.9 7.6 6.0  NEUTROABS 6.6 4.2  --   HGB 5.3* 9.5* 9.3*  HCT 17.5* 29.3* 29.6*  MCV 96.2 89.3 91.1  PLT 270 221 208   Cardiac Enzymes: No results for input(s): CKTOTAL, CKMB, CKMBINDEX, TROPONINI in the last 168 hours. BNP (last 3 results)  Recent Labs  12/29/14 1258 02/09/15 1030  BNP 593.0* 676.0*    ProBNP (last 3 results) No results for input(s): PROBNP in the last 8760 hours.  CBG: No results for input(s): GLUCAP in the last 168 hours.  No results found for this or any previous visit (from the past 240 hour(s)).   Studies: Ct Abdomen Pelvis W Contrast  04/05/2015   CLINICAL DATA:  Decreased hemoglobin/hematocrit.  81yof, HPI: Sierra Singleton is a 81 y.o. female with  Past medical history of chronic rash, hypothyroidism, chronic respiratory failure on oxygen, COPD, chronic diastolic dysfunction, A. fib not on any anticoagulation secondary to chronic anemia, recent pubic rami fracture.The patient is presenting with abnormal hemoglobin on routine labs.The patient was recently admitted in the hospital after a mechanical fall which led to multiple pubic rami fracture and mild hematoma.Patient was anticoagulation at time of the fall.Multiple Repeat H&H in the hospital remained stable and patient was taken  off of anticoagulation at the time of discharge.Patient has been having progressively declining course in the nursing home.She has been complaining of generalized fatigue and weakness also had some increasing shortness of breath.They repeated an H&H which was showing 6.7 hemoglobin and therefore they sent her to the ER for further workup.Repeat H&H was 5.3.Rash; Anxiety; Thyroid disease; Hyperlipidemia; COPD (chronic obstructive pulmonary disease); Poor historian; Carotid artery disease; Atrial fibrillation; Chronic respiratory failure with hypoxia; [name].  EXAM: CT ABDOMEN AND PELVIS WITH CONTRAST  TECHNIQUE: Multidetector CT imaging of the abdomen and pelvis was performed using the standard protocol following bolus administration of intravenous contrast.  CONTRAST:  100mL OMNIPAQUE IOHEXOL 300 MG/ML  SOLN  COMPARISON:  CT pelvis 03/23/2015  FINDINGS: Minimal right pleural effusion with atelectasis or consolidation in the right lung base. Nodular infiltration in the right lung base centrally measures 2.3 cm. Given the presence of additional consolidation in the right lung, this is likely inflammatory but the appearance is somewhat spiculated and followup in 3 months is suggested to exclude an underlying pulmonary nodule. Small esophageal hiatal hernia. Coronary artery calcifications.  The liver, spleen, gallbladder, pancreas, adrenal glands, inferior vena cava, and retroperitoneal lymph nodes are unremarkable. There is diffuse calcification of the abdominal aorta with probable significant narrowing of the lower abdominal aorta and iliac arteries. Prominent calcification at the origins of the superior mesenteric artery and both renal arteries probably also represents stenosis. The left kidney is diffusely atrophic, suggesting probable renal artery stenosis. Delayed nephrogram on the left. No hydronephrosis in either kidney. Stomach, small bowel, and colon are not abnormally distended. Contrast  material flows through to the colon without evidence of bowel obstruction. Stool-filled colon. No free air or free fluid in the abdomen.  Pelvis: Uterus and ovaries are not enlarged. Bladder wall is not thickened. Appendix is normal. Stool-filled rectosigmoid colon without inflammatory change. No pelvic mass or lymphadenopathy. Again demonstrated, there are comminuted fractures of the right superior and inferior pubic rami. Nondisplaced fracture right sacral ala. No evidence of significant hematoma. Degenerative changes in the lumbar spine.  IMPRESSION: Comminuted displaced fractures of the right superior and inferior pubic rami. No hematoma demonstrated in the abdomen or pelvis. Extensive vascular calcification in the aorta and branch vessels including the origins of the superior mesenteric artery and renal arteries bilaterally. Diffuse left renal atrophy probably indicates renal artery stenosis. Small right pleural effusion with infiltration in the right lung base probably inflammatory. Focal nodular infiltration in the right lung base is probably inflammatory but three-month follow-up is recommended to exclude underlying pulmonary nodule.   Electronically Signed   By: Burman NievesWilliam  Stevens M.D.   On: 04/05/2015 22:39    Scheduled Meds: . sodium chloride   Intravenous Once  . atorvastatin  20 mg Oral Daily  . Chlorhexidine Gluconate Cloth  6 each Topical Q0600  . ciprofloxacin  500 mg Oral BID  . citalopram  20 mg Oral Daily  . diltiazem  120 mg Oral BID  . ferrous sulfate  325 mg Oral BID WC  .  folic acid  1 mg Oral Daily  . levothyroxine  100 mcg Oral QAC breakfast  . metoprolol tartrate  25 mg Oral BID  . minocycline  100 mg Oral BID  . mupirocin ointment  1 application Nasal BID  . sodium chloride  3 mL Intravenous Q12H  . traZODone  150 mg Oral QHS   Continuous Infusions:    Time spent: > 35 minutes   Penny Pia  [facility] Pager [phone] If 7PM-7AM, please contact  night-coverage at [email or web address], password [facility] 04/07/2015, 6:27 PM  LOS: 2 days

## 2015-04-07 NOTE — Progress Notes (Signed)
Pt's heart rate is sustaining between the 140's and 150's.  Patient is resting quietly in bed. Asymptomatic.  Dr. Penny Piarlando Vega notified.

## 2015-04-07 NOTE — Progress Notes (Signed)
Dressing change was done in the morning before nurse left on 04/07/2015. Reported off that it was going to be done before I left and is charted at 0910 this morning, please see nurse's progress note. That is why patient refused because it was already done this morning. Nurse is here tonight and dressing will be changed on night shift at this time. Assigned nurse is aware of this also as well as management.

## 2015-04-07 NOTE — Evaluation (Signed)
Physical Therapy Evaluation Patient Details Name: Sierra KalesShelby J Keetch MRN: [id number] DOB: 1933/09/10 Today's Date: 04/07/2015   History of Present Illness  81 year old female, recent admission to the hospital for pubic rami fractures, also found to have a small amount of pelvic hemorrhage, she has been in a rehabilitation facility since that time and reports feeling more generally weak and feeling more short of breath. She denies chest pain abdominal pain, headache and has not had a BM in several days - some difficulty passing urine as well. Soft NT abd, clear lungs and heart, pale mucous membranes, clear speech, rash under teh breasts bilaterally which is chronic.Pt developed bleeding rash on her back as well.  Pt with Haley-Haley rash.  Clinical Impression  Pt admitted with above diagnosis. Pt currently with functional limitations due to the deficits listed below (see PT Problem List). Pt will need NHP as she is weak and with poor endurance.  Will follow acutely. Pt will benefit from skilled PT to increase their independence and safety with mobility to allow discharge to the venue listed below.     Follow Up Recommendations SNF;Supervision/Assistance - 24 hour    Equipment Recommendations  None recommended by PT    Recommendations for Other Services       Precautions / Restrictions Precautions Precautions: Fall Restrictions Weight Bearing Restrictions: No      Mobility  Bed Mobility Overal bed mobility: Needs Assistance Bed Mobility: Rolling;Sidelying to Sit Rolling: Min assist Sidelying to sit: Mod assist       General bed mobility comments: Pt needed assist for LEs and elevation of trunk.  Needed incr time.   Transfers Overall transfer level: Needs assistance Equipment used: Rolling walker (2 wheeled) Transfers: Sit to/from UGI CorporationStand;Stand Pivot Transfers Sit to Stand: Mod assist;From elevated surface Stand pivot transfers: Mod assist       General transfer comment: Pt  generally unsteady needing min assist overall but mod at times due to LOB all directions.  Needed assist to move RW and cues to get in line with chair.    Ambulation/Gait                Stairs            Wheelchair Mobility    Modified Rankin (Stroke Patients Only)       Balance Overall balance assessment: Needs assistance Sitting-balance support: No upper extremity supported;Feet supported Sitting balance-Leahy Scale: Fair   Postural control: Posterior lean Standing balance support: Bilateral upper extremity supported;During functional activity Standing balance-Leahy Scale: Poor Standing balance comment: requires Rw for support in standing.                               Pertinent Vitals/Pain Pain Assessment: Faces Faces Pain Scale: Hurts even more Pain Location: back rash Pain Descriptors / Indicators: Aching;Sore Pain Intervention(s): Limited activity within patient's tolerance;Monitored during session;Repositioned  On 2LO2.  VSS    Home Living Family/patient expects to be discharged to:: Skilled nursing facility                      Prior Function Level of Independence: Needs assistance   Gait / Transfers Assistance Needed: pt poor historian but per chart was using a w/c   ADL's / Homemaking Assistance Needed: unable to receive accurate PLOF suspect pt required assist due to dementia  Comments: Had been admitted to ALF for 3 weeks prior to 03/25/15 and had  a fall.  Went to NH first of May for therapy however now back in hospital.       Hand Dominance        Extremity/Trunk Assessment   Upper Extremity Assessment: Defer to OT evaluation           Lower Extremity Assessment: Generalized weakness         Communication   Communication: No difficulties  Cognition Arousal/Alertness: Awake/alert Behavior During Therapy: Anxious Overall Cognitive Status: Impaired/Different from baseline Area of Impairment:  Memory;Safety/judgement;Awareness;Orientation Orientation Level: Disoriented to;Time;Situation   Memory: Decreased short-term memory     Awareness: Emergent   General Comments: pt confused about equipment and says that son can help with questions.  she does say that she is going back to NH and son working on it.      General Comments General comments (skin integrity, edema, etc.): rash under breasts as well as bleeding rash on back.    Exercises        Assessment/Plan    PT Assessment Patient needs continued PT services  PT Diagnosis Difficulty walking;Acute pain   PT Problem List Decreased strength;Decreased range of motion;Decreased activity tolerance;Decreased balance;Decreased mobility;Decreased knowledge of use of DME;Decreased cognition;Pain  PT Treatment Interventions DME instruction;Gait training;Functional mobility training;Therapeutic activities;Therapeutic exercise   PT Goals (Current goals can be found in the Care Plan section) Acute Rehab PT Goals Patient Stated Goal: didn't state PT Goal Formulation: Patient unable to participate in goal setting Time For Goal Achievement: 04/21/15 Potential to Achieve Goals: Fair    Frequency Min 2X/week   Barriers to discharge Decreased caregiver support      Co-evaluation               End of Session Equipment Utilized During Treatment: Gait belt;Oxygen Activity Tolerance: No increased pain;Patient limited by fatigue Patient left: in chair;with call bell/phone within reach Nurse Communication: Mobility status         Time: 1253-1319 PT Time Calculation (min) (ACUTE ONLY): 26 min   Charges:   PT Evaluation $Initial PT Evaluation Tier I: 1 Procedure PT Treatments $Therapeutic Activity: 8-22 mins   PT G CodesBerline Lopes:        Derrian Rodak F 04/07/2015, 4:16 PM Kailyn Vanderslice,PT Acute Rehabilitation [phone] (pager)

## 2015-04-07 NOTE — Progress Notes (Signed)
At 1130 introduced self to pt as in coming nurse .  A7)x4.  Denies pain discomfort.  Call bell at bedside and instructed to call for help. Pt.  verbalized understanding.  Will continue to monitor.  Kayelynn Abdou,RN.

## 2015-04-07 NOTE — Clinical Social Work Note (Signed)
Clinical Social Work Assessment  Patient Details  Name: Sierra Singleton MRN: [id number] Date of Birth: 08/18/1933  Date of referral:  04/06/15               Reason for consult:  Facility Placement (Return to SNF- [facility])                Permission sought to share information with:  Facility Contact Representative, Family Supports Permission granted to share information::  Yes, Verbal Permission Granted  Name::     Son Barry and his wife, [facility] staff          Housing/Transportation Living arrangements for the past 2 months:  Post-Acute Facility Source of Information:  Patient, Adult Children Patient Interpreter Needed:  None Criminal Activity/Legal Involvement Pertinent to Current Situation/Hospitalization:  No - Comment as needed Significant Relationships:  Adult Children Lives with: Skilled Nursing Facility   Do you feel safe going back to the place where you live?  Yes Need for family participation in patient care:  Yes (Comment)  Care giving concerns:  Patient agrees to return there as long as it is ok with her son Barry. Sone indicated he is working on long term care Medicaid for patient and has been seeking possible long term facilities. Was interested in an Assisted Living- CSW discussed differences between ALF and SNF. Patient states "I came to the hospital because I'm bleeding somewhere but they can't find out where."   Social Worker assessment / plan:  81 year old female- resident of [facility] where she is a SNF level patient.  Bed is not being held at the facility but patient will be accepted back when medically stable and bed is available per Admissions Director - Janie Holden.  Fl2 placed on chart for MDs signature. CSW also met with patient's daughter-in-law Smita and patient's son Barry. They confirmed a desire for patient to return to [facility] and second choice would be [facility] as patient has been there in past.  CSW will  support bed search if it is indiated.  Employment status:  Retired Insurance information:  Managed Medicare PT Recommendations:  Skilled Nursing Facility Information / Referral to community resources:  Skilled Nursing Facility (Return to SNF)  Patient/Family's Response to care:  Patient states that she is still feeling bad and worries that despite multiple tests- they cannot find the cause of her bleeding.  Patient states that her heart rate has been elevated as well and this makes her feel bad.  Patient and family desire return to SNF level of care.  Patient/Family's Understanding of and Emotional Response to Diagnosis, Current Treatment, and Prognosis:  Patient, son and daughter-in-law verbalize a strong understanding of her current diagnosis and treatment plan/options. Patient's daughter-in-law is a unit director at [facility] and thus is able to check on patient frequently.  They are very supportive of patient; patient wishes to defer to her son and his wife re:  Medical decision making    Emotional Assessment Appearance:  Appears stated age Attitude/Demeanor/Rapport:  Other (Attitude/Demeanor appropriate for situation) Affect (typically observed):  Quiet, Calm, Pleasant Orientation:  Oriented to Place, Oriented to  Time, Oriented to Situation, Oriented to Self (Forgetful at times) Alcohol / Substance use:  Never Used Psych involvement (Current and /or in the community):  No (Comment)  Discharge Needs  Concerns to be addressed:  Care Coordination (Return to SNF) Readmission within the last 30 days:  No Current discharge risk:  None Barriers   to Discharge:  No Barriers Identified   Estill Bakes 04/07/2015, 10:53 PM

## 2015-04-07 NOTE — Progress Notes (Signed)
[facility]  CSW spoke with Wille CelesteJanie- [facility] who indicated that family is not holding patient's bed. She will monitor for stability and for bed availability. CSW Assessment to follow.  Lorri Frederickonna T. Jaci LazierCrowder, KentuckyLCSW [phone]

## 2015-04-07 NOTE — Progress Notes (Signed)
Pt. Has continued to have urinary retention. Foley catheter has been placed. 800cc of dark tea colored urine collected.

## 2015-04-08 ENCOUNTER — Encounter (HOSPITAL_COMMUNITY): Payer: Self-pay | Admitting: *Deleted

## 2015-04-08 ENCOUNTER — Inpatient Hospital Stay (HOSPITAL_COMMUNITY): Payer: Medicare Other

## 2015-04-08 LAB — CBC
HEMATOCRIT: 27.3 % — AB (ref 36.0–46.0)
Hemoglobin: 8.4 g/dL — ABNORMAL LOW (ref 12.0–15.0)
MCH: 28.5 pg (ref 26.0–34.0)
MCHC: 30.8 g/dL (ref 30.0–36.0)
MCV: 92.5 fL (ref 78.0–100.0)
PLATELETS: 189 10*3/uL (ref 150–400)
RBC: 2.95 MIL/uL — AB (ref 3.87–5.11)
RDW: 17.4 % — AB (ref 11.5–15.5)
WBC: 7.5 10*3/uL (ref 4.0–10.5)

## 2015-04-08 MED ORDER — IOHEXOL 300 MG/ML  SOLN
80.0000 mL | Freq: Once | INTRAMUSCULAR | Status: AC | PRN
  Administered 2015-04-08: 80 mL via INTRAVENOUS

## 2015-04-08 MED ORDER — FERROUS SULFATE 325 (65 FE) MG PO TABS
325.0000 mg | ORAL_TABLET | Freq: Three times a day (TID) | ORAL | Status: DC
  Administered 2015-04-08 – 2015-04-09 (×3): 325 mg via ORAL
  Filled 2015-04-08 (×5): qty 1

## 2015-04-08 MED ORDER — DESONIDE 0.05 % EX OINT
TOPICAL_OINTMENT | Freq: Two times a day (BID) | CUTANEOUS | Status: DC
Start: 2015-04-08 — End: 2015-04-08
  Filled 2015-04-08: qty 15

## 2015-04-08 NOTE — Progress Notes (Signed)
Please note that dressing should be changed late afternoon on day shift or around bedtime on night shift. The Vaseline gauze needs to be on wound/rash area to help dry it and heal. Dressing change was done around 2230 so should probably be done at around that time if possible unless drainage permits it to be changed earlier. Thanks. Luman Holway,RN,BSN,WTA

## 2015-04-08 NOTE — Progress Notes (Signed)
[facility] PROGRESS NOTE  Sierra KalesShelby J Singleton WGN:[id number] DOB: September 19, 1933 DOA: 04/05/2015 PCP: Ignatius SpeckingVYAS,DHRUV B., MD  Assessment/Plan: Principal Problem:   Symptomatic anemia - Improved after transfusion - On iron replacement as patient has history of iron deficiency. - Most likely secondary to pubic rami fractures with small amount of pelvic hemorrhage in patient with iron deficiency anemia. - Patient is still having decrease in hemoglobin levels unsure of etiology patient does have iron deficiency anemia and only on ferrous sulfate 325 mg by mouth twice a day daily which is not enough to replace iron deficiency anemia appropriately. She does have report of hematoma from recent pelvic fracture though. We'll go ahead and repeat CT scan of abdomen and pelvis with contrast to assess hematoma and evaluate whether this is enlarging or not. Otherwise will increase iron supplementation to 3 times daily  Active Problems:   COPD (chronic obstructive pulmonary disease) - Compensated currently, continue as needed albuterol    Atrial fibrillation - Continue Cardizem -Currently rate controlled with improved control after addition of beta blocker    Fracture of multiple pubic rami -Physical therapy recommending SNF on discharge    Chronic diastolic CHF (congestive heart failure) -Compensated currently continue current regimen    CKD (chronic kidney disease) -Stable. 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 Otherwise no new complaints. She relates that she would like to find a different  skilled nursing facility on discharge  Objective: Filed Vitals:   04/08/15 1524  BP: 149/40  Pulse: 71  Temp: 98.1 F (36.7 C)  Resp: 20    Intake/Output Summary (Last 24 hours) at 04/08/15 1558 Last data filed at 04/08/15 1524  Gross per 24 hour  Intake    600 ml  Output    501 ml  Net     99 ml   Filed Weights   04/06/15 0047 04/07/15 0544 04/08/15 0636  Weight: 78.427 kg (172 lb 14.4 oz) 78.288 kg (172 lb 9.5 oz) 78.881 kg (173 lb 14.4 oz)    Exam:   General:  Patient in no acute distress, alert and awake  Cardiovascular: Regular rate and rhythm, no murmurs or rubs  Respiratory: Clear to auscultation bilaterally, no wheezes  Abdomen: Soft, nondistended, nontender  Musculoskeletal: No cyanosis or clubbing   Data Reviewed: Basic Metabolic Panel:  Recent Labs Lab 04/05/15 1900 04/06/15 0832 04/07/15 0326  NA 137 138 138  K 3.6 4.0 4.3  CL 100* 103 101  CO2 27 27 29   GLUCOSE 107* 81 75  BUN 25* 19 13  CREATININE 1.15* 1.06* 0.99  CALCIUM 8.2* 8.2* 8.4*   Liver Function Tests:  Recent Labs Lab 04/06/15 0832  AST 20  ALT 12*  ALKPHOS 113  BILITOT 0.6  PROT 5.3*  ALBUMIN 1.7*   No results for input(s): LIPASE, AMYLASE in the last 168 hours. No results for input(s): AMMONIA in the last 168 hours. CBC:  Recent Labs Lab 04/05/15 1900 04/06/15 0832 04/07/15 0326 04/08/15 0309  WBC 9.9 7.6 6.0 7.5  NEUTROABS 6.6 4.2  --   --   HGB 5.3* 9.5* 9.3* 8.4*  HCT  17.5* 29.3* 29.6* 27.3*  MCV 96.2 89.3 91.1 92.5  PLT 270 221 208 189   Cardiac Enzymes: No results for input(s): CKTOTAL, CKMB, CKMBINDEX, TROPONINI in the last 168 hours. BNP (last 3 results)  Recent Labs  12/29/14 1258 02/09/15 1030  BNP 593.0* 676.0*    ProBNP (last 3 results) No results for input(s): PROBNP in the last 8760 hours.  CBG: No results for input(s): GLUCAP in the last 168 hours.  No results found for this or any previous visit (from the past 240 hour(s)).    Studies: No results found.  Scheduled Meds: . sodium chloride   Intravenous Once  . atorvastatin  20 mg Oral Daily  . Chlorhexidine Gluconate Cloth  6 each Topical Q0600  . ciprofloxacin  500 mg Oral BID  . citalopram  20 mg Oral Daily  . diltiazem  120 mg Oral BID  . ferrous sulfate  325 mg Oral TID WC  . folic acid  1 mg Oral Daily  . levothyroxine  100 mcg Oral QAC breakfast  . metoprolol tartrate  25 mg Oral BID  . minocycline  100 mg Oral BID  . mupirocin ointment  1 application Nasal BID  . sodium chloride  3 mL Intravenous Q12H  . traZODone  150 mg Oral QHS   Continuous Infusions:    Time spent: > 35 minutes   Penny PiaVEGA, Kaiyana Bedore  [facility] Pager [phone] If 7PM-7AM, please contact night-coverage at [email or web address], password [email or web address] 04/08/2015, 3:58 PM  LOS: 3 days

## 2015-04-08 NOTE — Progress Notes (Signed)
Dressings changes performed to blister-like rash located on mostly eighty percent of back area and under breast. Gently cleaned with normal saline and gently patted dry. There were about two areas on the back where it was bleeding but managed to stop. Applied vaseline gauze and covered with ABD pad on the back and under breast area except for the left breast for the rash is under and in the crease under breast and vaseline gauze needs to be there, the ABD pad would get in the way. Applied a new mesh to hold dressings in place and also lightly wrapped around upper chest area with kerlex. All dressings clean, dry, and intact. Linen was completely changed with the help of nurse tech- Megan. Patient likes for a top sheet to cover the bed pad so it does not touch her skin for it irritates it. Reported off to assigned nurse of the above.

## 2015-04-09 LAB — BASIC METABOLIC PANEL
Anion gap: 9 (ref 5–15)
BUN: 19 mg/dL (ref 6–20)
CALCIUM: 8.2 mg/dL — AB (ref 8.9–10.3)
CO2: 25 mmol/L (ref 22–32)
Chloride: 101 mmol/L (ref 101–111)
Creatinine, Ser: 1.15 mg/dL — ABNORMAL HIGH (ref 0.44–1.00)
GFR, EST AFRICAN AMERICAN: 51 mL/min — AB (ref >60)
GFR, EST NON AFRICAN AMERICAN: 44 mL/min — AB (ref >60)
Glucose, Bld: 79 mg/dL (ref 65–99)
Potassium: 4.1 mmol/L (ref 3.5–5.1)
SODIUM: 135 mmol/L (ref 135–145)

## 2015-04-09 LAB — CBC
HCT: 30.3 % — ABNORMAL LOW (ref 36.0–46.0)
Hemoglobin: 9.7 g/dL — ABNORMAL LOW (ref 12.0–15.0)
MCH: 29.7 pg (ref 26.0–34.0)
MCHC: 32 g/dL (ref 30.0–36.0)
MCV: 92.7 fL (ref 78.0–100.0)
Platelets: ADEQUATE 10*3/uL (ref 150–400)
RBC: 3.27 MIL/uL — ABNORMAL LOW (ref 3.87–5.11)
RDW: 17.3 % — ABNORMAL HIGH (ref 11.5–15.5)
WBC: 5.8 10*3/uL (ref 4.0–10.5)

## 2015-04-09 MED ORDER — FERROUS SULFATE 325 (65 FE) MG PO TABS
325.0000 mg | ORAL_TABLET | Freq: Three times a day (TID) | ORAL | Status: DC
  Administered 2015-04-10 (×3): 325 mg via ORAL
  Filled 2015-04-09 (×4): qty 1

## 2015-04-09 NOTE — Progress Notes (Signed)
[facility] PROGRESS NOTE  Sierra KalesShelby J Singleton WUJ:[id number] DOB: [birth year] DOA: 04/05/2015 PCP: Ignatius SpeckingVYAS,DHRUV B., MD  Assessment/Plan: Principal Problem:   Symptomatic anemia - Improved after transfusion - On iron replacement as patient has history of iron deficiency. - Most likely secondary to pubic rami fractures with small amount of pelvic hemorrhage in patient with iron deficiency anemia. - Patient is still having decrease in hemoglobin levels unsure of etiology patient does have iron deficiency anemia and only on ferrous sulfate 325 mg by mouth twice a day daily which is not enough to replace iron deficiency anemia appropriately.  - CT scan of pelvis did not show enlarging pelvic hematoma  Active Problems:   COPD (chronic obstructive pulmonary disease) - Compensated currently, continue as needed albuterol    Atrial fibrillation - Continue Cardizem -Currently rate controlled with improved control after addition of beta blocker    Fracture of multiple pubic rami -Physical therapy recommending SNF on discharge    Chronic diastolic CHF (congestive heart failure) -Compensated currently continue current regimen    CKD (chronic kidney disease) -Stable. 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  Objective: Filed Vitals:   04/09/15 0537  BP: 158/59  Pulse: 71  Temp: 97.9 F (36.6 C)  Resp: 20    Intake/Output Summary (Last 24 hours) at 04/09/15 1523 Last data filed at 04/09/15 0205  Gross per 24 hour  Intake    660 ml  Output    800 ml  Net   -140 ml   Filed Weights   04/07/15 0544 04/08/15 0636 04/09/15 0537  Weight:  78.288 kg (172 lb 9.5 oz) 78.881 kg (173 lb 14.4 oz) 78.427 kg (172 lb 14.4 oz)    Exam:   General:  Patient in no acute distress, alert and awake  Cardiovascular: Regular rate and rhythm, no murmurs or rubs  Respiratory: Clear to auscultation bilaterally, no wheezes  Abdomen: Soft, nondistended, nontender  Musculoskeletal: No cyanosis or clubbing   Data Reviewed: Basic Metabolic Panel:  Recent Labs Lab 04/05/15 1900 04/06/15 0832 04/07/15 0326 04/09/15 0504  NA 137 138 138 135  K 3.6 4.0 4.3 4.1  CL 100* 103 101 101  CO2 27 27 29 25   GLUCOSE 107* 81 75 79  BUN 25* 19 13 19   CREATININE 1.15* 1.06* 0.99 1.15*  CALCIUM 8.2* 8.2* 8.4* 8.2*   Liver Function Tests:  Recent Labs Lab 04/06/15 0832  AST 20  ALT 12*  ALKPHOS 113  BILITOT 0.6  PROT 5.3*  ALBUMIN 1.7*   No results for input(s): LIPASE, AMYLASE in the last 168 hours. No results for input(s): AMMONIA in the last 168 hours. CBC:  Recent Labs Lab 04/05/15 1900 04/06/15 0832 04/07/15 0326 04/08/15 0309 04/09/15 0504  WBC 9.9 7.6 6.0 7.5 5.8  NEUTROABS 6.6 4.2  --   --   --   HGB 5.3* 9.5* 9.3* 8.4* 9.7*  HCT 17.5* 29.3* 29.6* 27.3* 30.3*  MCV 96.2 89.3 91.1 92.5 92.7  PLT 270 221 208 189 PLATELET CLUMPS NOTED ON SMEAR, COUNT APPEARS ADEQUATE   Cardiac Enzymes: No results for input(s): CKTOTAL, CKMB, CKMBINDEX, TROPONINI in the last 168 hours. BNP (last 3 results)  Recent Labs  12/29/14 1258 02/09/15 1030  BNP 593.0* 676.0*    ProBNP (last 3 results) No results for input(s): PROBNP in the last 8760 hours.  CBG: No results for input(s): GLUCAP in the last 168 hours.  No results found for this or any previous visit (from the past 240 hour(s)).   Studies: Ct Abdomen Pelvis W Contrast  04/08/2015   CLINICAL DATA:  81 year old female with fall 2 weeks ago and continued decreasing hemoglobin. Right pelvic fractures.  EXAM: CT ABDOMEN AND PELVIS WITH CONTRAST  TECHNIQUE: Multidetector CT  imaging of the abdomen and pelvis was performed using the standard protocol following bolus administration of intravenous contrast.  CONTRAST:  80mL OMNIPAQUE IOHEXOL 300 MG/ML  SOLN  COMPARISON:  04/05/2015 CT  FINDINGS: Lower chest: A 2.3 cm focal consolidation versus mass in the right lower lobe again noted. A bulla in the inferior medial right lung noted. Mild bibasilar atelectasis is identified.  Hepatobiliary: Slightly nodular hepatic contour were and hepatic configuration may represent cirrhosis. No focal hepatic lesions are identified. The gallbladder is unremarkable. There is no evidence of biliary dilatation.  Pancreas: Unremarkable  Spleen: Upper limits of normal in size.  Adrenals/Urinary Tract: Severe left renal atrophy and nonobstructing lower pole renal calculus noted.  The right kidney is unremarkable except for mild cortical thinning. The adrenal glands are unremarkable. A Foley catheter is present within the bladder.  Stomach/Bowel: Descending and sigmoid colonic diverticulosis noted without diverticulitis. There is no evidence of bowel obstruction or focal bowel wall thickening. The appendix is normal.  Vascular/Lymphatic: No enlarged lymph nodes or abdominal aortic aneurysm.  Reproductive: The uterus and adnexal regions are unremarkable.  Other: No free fluid, abscess or pneumoperitoneum. No retroperitoneal hematoma is identified. Is  Musculoskeletal: Fractures of the inferior and superior right pubic rami and right sacrum are again noted.  IMPRESSION: Right pubic and sacral fractures again noted. No evidence of significant abdominal/pelvic hematoma.  2.3 cm right lower lobe focal consolidation/mass. Malignancy is not excluded and consider very short-term follow-up.  Question cirrhosis -correlate clinically.  Mild bibasilar atelectasis, colonic diverticulosis without diverticulitis and severe left renal atrophy.   Electronically Signed   By: Harmon PierJeffrey  Hu M.D.   On: 04/08/2015 17:06    Scheduled  Meds: . sodium chloride   Intravenous Once  . atorvastatin  20 mg Oral Daily  . Chlorhexidine Gluconate Cloth  6 each Topical Q0600  . ciprofloxacin  500 mg Oral BID  . citalopram  20 mg Oral Daily  . diltiazem  120 mg Oral BID  . ferrous sulfate  325 mg Oral TID WC  . folic acid  1 mg Oral Daily  . levothyroxine  100 mcg Oral QAC breakfast  . metoprolol tartrate  25 mg Oral BID  . minocycline  100 mg Oral BID  . mupirocin ointment  1 application Nasal BID  . sodium chloride  3 mL Intravenous Q12H  . traZODone  150 mg Oral QHS   Continuous Infusions:    Time spent: > 35 minutes   Penny PiaVEGA, Braedin Millhouse  [facility] Pager [phone] If 7PM-7AM, please contact night-coverage at [email or web address], password [email or web address] 04/09/2015, 3:23 PM  LOS: 4 days

## 2015-04-10 MED ORDER — FERROUS SULFATE 325 (65 FE) MG PO TABS: 325.0000 mg | ORAL_TABLET | Freq: Three times a day (TID) | ORAL | Status: DC

## 2015-04-10 NOTE — Progress Notes (Signed)
[facility] [place]  Verbalized understanding.

## 2015-04-10 NOTE — Progress Notes (Signed)
Pt d/c off floor via ambulance. To [facility].  Shaylinn Hladik,RN.

## 2015-04-10 NOTE — Progress Notes (Signed)
Ok per MD for [facility] today [facility]. Per Genia HaroldJanie Holden- Admissions- bed is available for patient and son signed re-admission papers at facility this afternoon.  Patient was extremely apprehensive of d/c this afternoon- wanting to stay the night so that her evening nurse Rashida could dress the areas on her back. "She does a great job and doesn't hurt me."  Patient also stated that she worried about returning to her same room as her roommate "talked all the time." [facility] she has arranged for patient to move to another room.  Foley cath removed and patient voided. EMS called for transport. Daughter-in-law Jacklyn ShellSmita notified of above as patient requested to talk to her before she left the hospital. Patient was much calmer prior to this CSW leaving her room.    Nursing notified to call report. CSW signing off. Lorri Frederickonna T. Jaci LazierCrowder, KentuckyLCSW [phone]

## 2015-04-10 NOTE — Discharge Summary (Signed)
Physician Discharge Summary  Sierra Singleton ZOX:[id number] DOB: 02-13-1933 DOA: 04/05/2015  PCP: Ignatius Specking., MD  Admit date: 04/05/2015 Discharge date: 04/10/2015  Time spent: > 35  minutes  Recommendations for Outpatient Follow-up:  1.  Will have secretary schedule appointment with GI on discharge for further evaluation and see if there is any further GI workup required given recent anemia 2. Patient had recent pelvic fracture with no worsening hematoma repeat CT scan 3. Patient had iron deficiency as such will be discharged on iron supplementation 4. 2.3 cm right lower lobe focal consolidation/mass. Malignancy is not excluded and consider very short-term follow-up. We'll have primary care physician follow up on this further 5. Please monitor urine output. Should output remained low please contact  primary care physician for further evaluation recommendations  Discharge Diagnoses:  Principal Problem:   Symptomatic anemia Active Problems:   COPD (chronic obstructive pulmonary disease)   Atrial fibrillation   Chronic respiratory failure with hypoxia   Fracture of multiple pubic rami   Chronic diastolic CHF (congestive heart failure)   CKD (chronic kidney disease)   [name]   Discharge Condition:  Stable  Diet recommendation:  Heart healthy  Filed Weights   04/08/15 0636 04/09/15 0537 04/10/15 0558  Weight: 173 lb 14.4 oz (78.881 kg) 172 lb 14.4 oz (78.427 kg) 171 lb 11.2 oz (77.883 kg)    History of present illness:  From original history of present illness: 81 y.o. female with Past medical history of chronic rash, hypothyroidism, chronic respiratory failure on oxygen, COPD, chronic diastolic dysfunction, A. fib not on any anticoagulation secondary to chronic anemia, recent pubic rami fracture. The patient is presenting with abnormal hemoglobin on routine labs.  Hospital Course:  Anemia - Repeat CT scan did not show worsening pelvic hematoma - CBC to be  repeated within one week -Patient has iron deficiency anemia and not obtaining appropriate iron supplementation. Suspect this is contributory. We'll also have secretary set up follow-up appointment gastroenterologist for further evaluation from their standpoint  Focal consolidation/Mass - 2.3 cm right lower lobe focal consolidation/mass. Malignancy is not excluded per CT scan report - Will recommend patient f/u with pcp for further evaluation and recommendations  Urinary retention -Benadryl discontinued and Foley catheter discontinued on discharge - Should this be a continued problem patient may require Foley catheter placement and urology follow-up  Procedures:  none  Consultations:  none  Discharge Exam: Filed Vitals:   04/10/15 1329  BP: 91/64  Pulse: 73  Temp:   Resp: 18    General: Pt in nad, alert and awake Cardiovascular: rrr, no rubs Respiratory: cta bl, no wheezes  Discharge Instructions   Discharge Instructions    Call MD for:  difficulty breathing, headache or visual disturbances    Complete by:  As directed      Call MD for:  severe uncontrolled pain    Complete by:  As directed      Call MD for:  temperature >100.4    Complete by:  As directed      Diet - low sodium heart healthy    Complete by:  As directed      Discharge instructions    Complete by:  As directed   Please follow up with your primary care physician in 1-2 weeks or sooner should any new concerns arise.  Also have your pcp reassess your hgb levels within the next 1 week     Increase activity slowly    Complete by:  As directed           Current Discharge Medication List    CONTINUE these medications which have CHANGED   Details  ferrous sulfate 325 (65 FE) MG tablet Take 1 tablet (325 mg total) by mouth 3 (three) times daily with meals. Qty: 90 tablet, Refills: 0      CONTINUE these medications which have NOT CHANGED   Details  atorvastatin (LIPITOR) 20 MG tablet Take 20 mg  by mouth daily.    cetirizine (ZYRTEC) 10 MG tablet Take 10 mg by mouth 2 (two) times daily.     ciprofloxacin (CIPRO) 500 MG tablet Take 500 mg by mouth 2 (two) times daily. MAR indicates she takes it everyday. No start or end date    citalopram (CELEXA) 20 MG tablet Take 20 mg by mouth daily.    diltiazem (CARDIZEM CD) 120 MG 24 hr capsule Take 1 capsule (120 mg total) by mouth 2 (two) times daily.    fentaNYL (DURAGESIC - DOSED MCG/HR) 12 MCG/HR Place 12.5 mcg onto the skin every 3 (three) days.    folic acid (FOLVITE) 1 MG tablet Take 1 mg by mouth daily.    furosemide (LASIX) 20 MG tablet Take 1 tablet (20 mg total) by mouth every other day. Alternate with 40 mg Qty: 30 tablet, Refills: 0    levalbuterol (XOPENEX) 0.63 MG/3ML nebulizer solution Take 3 mLs (0.63 mg total) by nebulization every 6 (six) hours as needed for wheezing or shortness of breath. Qty: 3 mL, Refills: 12    levothyroxine (SYNTHROID, LEVOTHROID) 112 MCG tablet Take 112 mcg by mouth daily before breakfast.    LORazepam (ATIVAN) 0.5 MG tablet Take 1 tablet (0.5 mg total) by mouth 2 (two) times daily as needed for anxiety. Qty: 10 tablet, Refills: 0    methotrexate 2.5 MG tablet Take 10 mg by mouth once a week. On Sunday.    metoprolol (LOPRESSOR) 25 MG tablet Take 1 tablet (25 mg total) by mouth 2 (two) times daily.    minocycline (MINOCIN,DYNACIN) 100 MG capsule Take 100 mg by mouth 2 (two) times daily.    traZODone (DESYREL) 150 MG tablet Take 150 mg by mouth at bedtime.     Vitamin D, Ergocalciferol, (DRISDOL) 50000 UNITS CAPS capsule Take 50,000 Units by mouth every 7 (seven) days. Take on thursdays      STOP taking these medications     diphenhydrAMINE (BENADRYL) 25 mg capsule      HYDROcodone-acetaminophen (NORCO) 7.5-325 MG per tablet        Allergies  Allergen Reactions  . Ambien [Zolpidem Tartrate]     unknown  . Sulfa Antibiotics Nausea And Vomiting  . Clindamycin/Lincomycin Swelling  and Rash      The results of significant diagnostics from this hospitalization (including imaging, microbiology, ancillary and laboratory) are listed below for reference.    Significant Diagnostic Studies: Ct Head Wo Contrast  03/22/2015   CLINICAL DATA:  81 year old female status post fall out of wheelchair  EXAM: CT HEAD WITHOUT CONTRAST  CT CERVICAL SPINE WITHOUT CONTRAST  TECHNIQUE: Multidetector CT imaging of the head and cervical spine was performed following the standard protocol without intravenous contrast. Multiplanar CT image reconstructions of the cervical spine were also generated.  COMPARISON:  Prior CT scan of the head and cervical spine 05/08/2009  FINDINGS: CT HEAD FINDINGS  Negative for acute intracranial hemorrhage, acute infarction, mass, mass effect, hydrocephalus or midline shift. Gray-white differentiation is preserved throughout. Small right occipital scalp  contusion. No evidence of underlying calvarial fracture. Globes and orbits are symmetric bilaterally. Normal aeration of the mastoid air cells and the visualized paranasal sinuses.  CT CERVICAL SPINE FINDINGS  No acute fracture, malalignment or prevertebral soft tissue swelling. Exaggerated thoracic kyphosis. Mild multilevel cervical spondylosis without focality. No acute soft tissue abnormality. Biapical pulmonary emphysema.  IMPRESSION: CT HEAD  1. No acute intracranial abnormality. 2. Small right occipital scalp contusion. CT CSPINE  1. No acute fracture or malalignment. 2. Pulmonary emphysema. 3. Exaggerated thoracic kyphosis.   Electronically Signed   By: Malachy MoanHeath  McCullough M.D.   On: 03/22/2015 22:04   Ct Cervical Spine Wo Contrast  03/22/2015   CLINICAL DATA:  81 year old female status post fall out of wheelchair  EXAM: CT HEAD WITHOUT CONTRAST  CT CERVICAL SPINE WITHOUT CONTRAST  TECHNIQUE: Multidetector CT imaging of the head and cervical spine was performed following the standard protocol without intravenous contrast.  Multiplanar CT image reconstructions of the cervical spine were also generated.  COMPARISON:  Prior CT scan of the head and cervical spine 05/08/2009  FINDINGS: CT HEAD FINDINGS  Negative for acute intracranial hemorrhage, acute infarction, mass, mass effect, hydrocephalus or midline shift. Gray-white differentiation is preserved throughout. Small right occipital scalp contusion. No evidence of underlying calvarial fracture. Globes and orbits are symmetric bilaterally. Normal aeration of the mastoid air cells and the visualized paranasal sinuses.  CT CERVICAL SPINE FINDINGS  No acute fracture, malalignment or prevertebral soft tissue swelling. Exaggerated thoracic kyphosis. Mild multilevel cervical spondylosis without focality. No acute soft tissue abnormality. Biapical pulmonary emphysema.  IMPRESSION: CT HEAD  1. No acute intracranial abnormality. 2. Small right occipital scalp contusion. CT CSPINE  1. No acute fracture or malalignment. 2. Pulmonary emphysema. 3. Exaggerated thoracic kyphosis.   Electronically Signed   By: Malachy MoanHeath  McCullough M.D.   On: 03/22/2015 22:04   Ct Pelvis Wo Contrast  03/23/2015   CLINICAL DATA:  81 year old female with right pelvic fractures after falling from her wheelchair  EXAM: CT PELVIS WITHOUT CONTRAST  TECHNIQUE: Multidetector CT imaging of the pelvis was performed following the standard protocol without intravenous contrast.  COMPARISON:  Pelvic and right hip radiographs 03/22/2015  FINDINGS: Visualized intra of pelvic contents remarkable for sigmoid diverticulosis without evidence of active diverticulitis and moderate rectal stool burden. Normal appendix noted in the right lower quadrant. Unremarkable uterus and bladder. No free fluid or suspicious adenopathy.  Diffuse atherosclerotic vascular calcifications.  Nondisplaced fracture through the anterior aspect of the right sacral ala at S4. Comminuted but minimally displaced fracture through the superior pubic ramus on the  right. Additionally, there is a mildly displaced fracture through the inferior pubic ramus. The acetabulum is intact. The right femur is intact. The left pelvis and femur are intact. Mild hematoma surrounding the superior and inferior pubic rami fractures. No joint effusion. No significant hematoma about sacral fracture.  IMPRESSION: 1. Mildly displaced acute fractures of the parasymphyseal superior pubic ramus and mid inferior pubic ramus as seen on the prior radiograph. Mild peri-fracture hematoma. 2. Nondisplaced fracture through the anterior aspect of the right S4 sacral ala. 3. Colonic diverticular disease without CT evidence of active inflammation. 4. Atherosclerotic vascular calcifications.   Electronically Signed   By: Malachy MoanHeath  McCullough M.D.   On: 03/23/2015 01:23   Ct Abdomen Pelvis W Contrast  04/08/2015   CLINICAL DATA:  81 year old female with fall 2 weeks ago and continued decreasing hemoglobin. Right pelvic fractures.  EXAM: CT ABDOMEN AND PELVIS WITH CONTRAST  TECHNIQUE: Multidetector CT imaging of the abdomen and pelvis was performed using the standard protocol following bolus administration of intravenous contrast.  CONTRAST:  80mL OMNIPAQUE IOHEXOL 300 MG/ML  SOLN  COMPARISON:  04/05/2015 CT  FINDINGS: Lower chest: A 2.3 cm focal consolidation versus mass in the right lower lobe again noted. A bulla in the inferior medial right lung noted. Mild bibasilar atelectasis is identified.  Hepatobiliary: Slightly nodular hepatic contour were and hepatic configuration may represent cirrhosis. No focal hepatic lesions are identified. The gallbladder is unremarkable. There is no evidence of biliary dilatation.  Pancreas: Unremarkable  Spleen: Upper limits of normal in size.  Adrenals/Urinary Tract: Severe left renal atrophy and nonobstructing lower pole renal calculus noted.  The right kidney is unremarkable except for mild cortical thinning. The adrenal glands are unremarkable. A Foley catheter is present  within the bladder.  Stomach/Bowel: Descending and sigmoid colonic diverticulosis noted without diverticulitis. There is no evidence of bowel obstruction or focal bowel wall thickening. The appendix is normal.  Vascular/Lymphatic: No enlarged lymph nodes or abdominal aortic aneurysm.  Reproductive: The uterus and adnexal regions are unremarkable.  Other: No free fluid, abscess or pneumoperitoneum. No retroperitoneal hematoma is identified. Is  Musculoskeletal: Fractures of the inferior and superior right pubic rami and right sacrum are again noted.  IMPRESSION: Right pubic and sacral fractures again noted. No evidence of significant abdominal/pelvic hematoma.  2.3 cm right lower lobe focal consolidation/mass. Malignancy is not excluded and consider very short-term follow-up.  Question cirrhosis -correlate clinically.  Mild bibasilar atelectasis, colonic diverticulosis without diverticulitis and severe left renal atrophy.   Electronically Signed   By: Harmon PierJeffrey  Hu M.D.   On: 04/08/2015 17:06   Ct Abdomen Pelvis W Contrast  04/05/2015   CLINICAL DATA:  Decreased hemoglobin/hematocrit.  81yof, HPI: Wilnette KalesShelby J Sobalvarro is a 81 y.o. female with Past medical history of chronic rash, hypothyroidism, chronic respiratory failure on oxygen, COPD, chronic diastolic dysfunction, A. fib not on any anticoagulation secondary to chronic anemia, recent pubic rami fracture.The patient is presenting with abnormal hemoglobin on routine labs.The patient was recently admitted in the hospital after a mechanical fall which led to multiple pubic rami fracture and mild hematoma.Patient was anticoagulation at time of the fall.Multiple Repeat H&H in the hospital remained stable and patient was taken off of anticoagulation at the time of discharge.Patient has been having progressively declining course in the nursing home.She has been complaining of generalized fatigue and weakness also had some increasing shortness of breath.They repeated an H&H  which was showing 6.7 hemoglobin and therefore they sent her to the ER for further workup.Repeat H&H was 5.3.Rash; Anxiety; Thyroid disease; Hyperlipidemia; COPD (chronic obstructive pulmonary disease); Poor historian; Carotid artery disease; Atrial fibrillation; Chronic respiratory failure with hypoxia; [name].  EXAM: CT ABDOMEN AND PELVIS WITH CONTRAST  TECHNIQUE: Multidetector CT imaging of the abdomen and pelvis was performed using the standard protocol following bolus administration of intravenous contrast.  CONTRAST:  100mL OMNIPAQUE IOHEXOL 300 MG/ML  SOLN  COMPARISON:  CT pelvis 03/23/2015  FINDINGS: Minimal right pleural effusion with atelectasis or consolidation in the right lung base. Nodular infiltration in the right lung base centrally measures 2.3 cm. Given the presence of additional consolidation in the right lung, this is likely inflammatory but the appearance is somewhat spiculated and followup in 3 months is suggested to exclude an underlying pulmonary nodule. Small esophageal hiatal hernia. Coronary artery calcifications.  The liver, spleen, gallbladder, pancreas, adrenal glands, inferior vena cava, and retroperitoneal  lymph nodes are unremarkable. There is diffuse calcification of the abdominal aorta with probable significant narrowing of the lower abdominal aorta and iliac arteries. Prominent calcification at the origins of the superior mesenteric artery and both renal arteries probably also represents stenosis. The left kidney is diffusely atrophic, suggesting probable renal artery stenosis. Delayed nephrogram on the left. No hydronephrosis in either kidney. Stomach, small bowel, and colon are not abnormally distended. Contrast material flows through to the colon without evidence of bowel obstruction. Stool-filled colon. No free air or free fluid in the abdomen.  Pelvis: Uterus and ovaries are not enlarged. Bladder wall is not thickened. Appendix is normal. Stool-filled  rectosigmoid colon without inflammatory change. No pelvic mass or lymphadenopathy. Again demonstrated, there are comminuted fractures of the right superior and inferior pubic rami. Nondisplaced fracture right sacral ala. No evidence of significant hematoma. Degenerative changes in the lumbar spine.  IMPRESSION: Comminuted displaced fractures of the right superior and inferior pubic rami. No hematoma demonstrated in the abdomen or pelvis. Extensive vascular calcification in the aorta and branch vessels including the origins of the superior mesenteric artery and renal arteries bilaterally. Diffuse left renal atrophy probably indicates renal artery stenosis. Small right pleural effusion with infiltration in the right lung base probably inflammatory. Focal nodular infiltration in the right lung base is probably inflammatory but three-month follow-up is recommended to exclude underlying pulmonary nodule.   Electronically Signed   By: Burman Nieves M.D.   On: 04/05/2015 22:39   Dg Hip Unilat With Pelvis 2-3 Views Left  03/22/2015   CLINICAL DATA:  81 year old female with right leg pain after fall  EXAM: LEFT HIP (WITH PELVIS) 2-3 VIEWS  COMPARISON:  Prior pelvic and left hip radiographs 02/14/2015  FINDINGS: Acute minimally displaced fractures of the right superior and inferior pubic rami. The right proximal femur remains intact. The bones appear osteopenic. Mild atherosclerotic vascular calcifications.  IMPRESSION: 1. Minimally displaced fractures of the right superior and inferior pubic rami.   Electronically Signed   By: Malachy Moan M.D.   On: 03/22/2015 22:05    Microbiology: No results found for this or any previous visit (from the past 240 hour(s)).   Labs: Basic Metabolic Panel:  Recent Labs Lab 04/05/15 1900 04/06/15 0832 04/07/15 0326 04/09/15 0504  NA 137 138 138 135  K 3.6 4.0 4.3 4.1  CL 100* 103 101 101  CO2 27 27 29 25   GLUCOSE 107* 81 75 79  BUN 25* 19 13 19   CREATININE  1.15* 1.06* 0.99 1.15*  CALCIUM 8.2* 8.2* 8.4* 8.2*   Liver Function Tests:  Recent Labs Lab 04/06/15 0832  AST 20  ALT 12*  ALKPHOS 113  BILITOT 0.6  PROT 5.3*  ALBUMIN 1.7*   No results for input(s): LIPASE, AMYLASE in the last 168 hours. No results for input(s): AMMONIA in the last 168 hours. CBC:  Recent Labs Lab 04/05/15 1900 04/06/15 0832 04/07/15 0326 04/08/15 0309 04/09/15 0504  WBC 9.9 7.6 6.0 7.5 5.8  NEUTROABS 6.6 4.2  --   --   --   HGB 5.3* 9.5* 9.3* 8.4* 9.7*  HCT 17.5* 29.3* 29.6* 27.3* 30.3*  MCV 96.2 89.3 91.1 92.5 92.7  PLT 270 221 208 189 PLATELET CLUMPS NOTED ON SMEAR, COUNT APPEARS ADEQUATE   Cardiac Enzymes: No results for input(s): CKTOTAL, CKMB, CKMBINDEX, TROPONINI in the last 168 hours. BNP: BNP (last 3 results)  Recent Labs  12/29/14 1258 02/09/15 1030  BNP 593.0* 676.0*    ProBNP (  last 3 results) No results for input(s): PROBNP in the last 8760 hours.  CBG: No results for input(s): GLUCAP in the last 168 hours.     Signed:  Penny Pia  [facility] 04/10/2015, 3:41 PM

## 2015-04-13 LAB — METHYLMALONIC ACID(MMA), RND URINE
Creatinine, Urine mg/dL-MMAURN: 7.64 mmol/L (ref 2.38–26.55)
Methylmalonic Acid, Ur: 0.5 mmol/mol{creat} (ref 0.3–1.9)

## 2015-04-17 ENCOUNTER — Ambulatory Visit (INDEPENDENT_AMBULATORY_CARE_PROVIDER_SITE_OTHER): Payer: Medicare Other | Admitting: Pulmonary Disease

## 2015-04-17 ENCOUNTER — Encounter: Payer: Self-pay | Admitting: Pulmonary Disease

## 2015-04-17 VITALS — BP 140/58 | HR 71 | Ht 64.0 in | Wt 172.0 lb

## 2015-04-17 DIAGNOSIS — J9611 Chronic respiratory failure with hypoxia: Secondary | ICD-10-CM | POA: Diagnosis not present

## 2015-04-17 DIAGNOSIS — J449 Chronic obstructive pulmonary disease, unspecified: Secondary | ICD-10-CM

## 2015-04-17 DIAGNOSIS — I5032 Chronic diastolic (congestive) heart failure: Secondary | ICD-10-CM

## 2015-04-17 DIAGNOSIS — R918 Other nonspecific abnormal finding of lung field: Secondary | ICD-10-CM

## 2015-04-17 NOTE — Assessment & Plan Note (Signed)
Ct O2 Reassess O2 needs once improved

## 2015-04-17 NOTE — Assessment & Plan Note (Signed)
Use xopenex nebs q  6h as needed for wheezing

## 2015-04-17 NOTE — Assessment & Plan Note (Signed)
Increase lasix to 40 mg daily Make FU appt with cardiology -she insists she wants to see dr Purvis SheffieldKoneswaran

## 2015-04-17 NOTE — Patient Instructions (Signed)
Increase lasix to 40 mg daily Make FU appt with cardiology  Use xopenex nebs q  6h as needed for wheezing

## 2015-04-17 NOTE — Progress Notes (Signed)
Subjective:    Patient ID: Sierra Singleton, female    DOB: 01-10-1933, 81 y.o.   MRN: [id number]  HPI  81 y.o. ex-smoker referred for management of COPD and oxygen She is accompanied by her son today  Past medical history of chronic rash, hypothyroidism, chronic respiratory failure on oxygen, COPD, chronic diastolic dysfunction, A. fib not on any anticoagulation secondary to chronic anemia, recent pubic rami fracture. She is maintained on methotrexate for a chronic rash on her back. Adm 02/2015 after a fall , rivaroxaban stopped CT scan of pelvis showed acute displaced fracture of the parasymphyseal superior pubic ramus and mild inferior pubic ramus with mild peri-fracture hematoma  Adm 5/11-5/16 CT abdomen 04/08/15 - bibasal atelectasis and 2.3 cm right lower lobe focal consolidation/mass This was not noted in 2012  She denies cough, wheezing or dyspnea. Her main complaint is increasing pedal edema-she would like to see her cardiologist dr Kirtland BouchardK -and kept Bringing this up throughout the interview She is maintained on Lasix 40 and 20 mg alternating Xopenex nebs to be used as needed  Past Medical History  Diagnosis Date  . Rash     Zebedee IbaHaley Haley rash  . Anxiety   . Thyroid disease   . Hyperlipidemia   . Hypertension   . COPD (chronic obstructive pulmonary disease)   . Poor historian   . Carotid artery disease   . Atrial fibrillation 02/11/2015  . Chronic respiratory failure with hypoxia 02/11/2015  . 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 Collagen vascular disease   . CHF (congestive heart failure)      Past Surgical History  Procedure Laterality Date  . Back surgery      Allergies  Allergen Reactions  . Ambien [Zolpidem Tartrate]     unknown  . Sulfa Antibiotics Nausea And Vomiting  . Clindamycin/Lincomycin Swelling and Rash    History   Social History  . Marital Status: Divorced    Spouse Name: N/A  . Number of Children: N/A  . Years of Education: N/A   Occupational  History  . Not on file.   Social History Main Topics  . Smoking status: Former Games developermoker  . Smokeless tobacco: Not on file  . Alcohol Use: No  . Drug Use: No  . Sexual Activity: No   Other Topics Concern  . Not on file   Social History Narrative    Family History  Problem Relation Age of Onset  . Cancer Father   . Stroke Mother      Review of Systems  Constitutional: Negative for fever and unexpected weight change.  HENT: Positive for nosebleeds, postnasal drip and sinus pressure. Negative for congestion, dental problem, ear discharge, mouth sores, rhinorrhea, sneezing, sore throat and trouble swallowing.   Eyes: Negative for redness and itching.  Respiratory: Positive for shortness of breath. Negative for cough, chest tightness and wheezing.   Cardiovascular: Positive for leg swelling. Negative for palpitations.  Gastrointestinal: Negative for nausea and vomiting.  Genitourinary: Negative for dysuria.  Musculoskeletal: Positive for joint swelling.  Skin: Positive for rash.  Neurological: Negative for headaches.  Hematological: Bruises/bleeds easily.  Psychiatric/Behavioral: Negative for dysphoric mood. The patient is not nervous/anxious.        Objective:   Physical Exam  Gen. Elderly, well-nourished, in no distress, normal affect, in a wheelchair ENT - no lesions, no post nasal drip Neck: No JVD, no thyromegaly, no carotid bruits Lungs: no use of accessory muscles, no dullness to percussion, clear without rales or rhonchi  Cardiovascular: Rhythm regular, heart sounds  normal, no murmurs or gallops, 2+ peripheral edema Abdomen: soft and non-tender, no hepatosplenomegaly, BS normal. Musculoskeletal: No deformities, no cyanosis or clubbing Neuro:  alert, non focal       Assessment & Plan:

## 2015-04-18 DIAGNOSIS — R918 Other nonspecific abnormal finding of lung field: Secondary | ICD-10-CM | POA: Insufficient documentation

## 2015-04-18 NOTE — Assessment & Plan Note (Signed)
A follow-up CT scan should be scheduled in 3-4 months

## 2015-05-01 ENCOUNTER — Ambulatory Visit: Payer: Medicare Other | Admitting: Cardiovascular Disease

## 2015-05-05 ENCOUNTER — Encounter: Payer: Self-pay | Admitting: Interventional Cardiology

## 2015-05-05 ENCOUNTER — Ambulatory Visit (INDEPENDENT_AMBULATORY_CARE_PROVIDER_SITE_OTHER): Payer: Medicare Other | Admitting: Interventional Cardiology

## 2015-05-05 VITALS — BP 208/66 | HR 86 | Ht 64.0 in | Wt 176.0 lb

## 2015-05-05 DIAGNOSIS — N183 Chronic kidney disease, stage 3 (moderate): Secondary | ICD-10-CM | POA: Diagnosis not present

## 2015-05-05 DIAGNOSIS — I482 Chronic atrial fibrillation, unspecified: Secondary | ICD-10-CM

## 2015-05-05 DIAGNOSIS — I5032 Chronic diastolic (congestive) heart failure: Secondary | ICD-10-CM

## 2015-05-05 DIAGNOSIS — J449 Chronic obstructive pulmonary disease, unspecified: Secondary | ICD-10-CM | POA: Diagnosis not present

## 2015-05-05 NOTE — Patient Instructions (Signed)
Medication Instructions:  Your physician has recommended you make the following change in your medication: 1) INCREASE Lasix to 40mg  daily   Labwork: Your physician recommends that you return for lab work in: 1 week @ [facility]  Your physician recommends that you return for lab work in: 2 weeks same day as f/u appointment   Testing/Procedures: None  Follow-Up: Your physician recommends that you schedule a follow-up appointment in: 2 weeks with the PA/NP   Any Other Special Instructions Will Be Listed Below (If Applicable). Your physician recommends that you weigh, daily, at the same time every day, and in the same amount of clothing. Please record your daily weights.

## 2015-05-05 NOTE — Progress Notes (Signed)
Cardiology Office Note   Date:  05/05/2015   ID:  Alanny, Ercoli [birth year], MRN [id number]  PCP:  Ignatius Specking., MD  Cardiologist:  Lesleigh Noe, MD   Chief Complaint  Patient presents with  . Congestive Heart Failure      History of Present Illness: Sierra Singleton is a 81 y.o. female who presents for COPD, chronic kidney disease, diastolic heart failure, paroxysmal atrial fibrillation, history of recurrent GI bleeding with anemia, absence of anticoagulation therapy causes of anemia, and hypertension.  Patient been previously followed by Dr. Ivonne Andrew. She now is a permanent resident at [facility] near [place]. She is referred for long-term cardiology maintenance. The patient is new to me. She has a history of diastolic heart failure and her major complaint today is progressive lower extremity swelling. She is on chronic oxygen therapy. She has recently seen Dr. Vassie Loll, pulmonology. He facilitated for cardiology follow-up. The patient denies chest pain. She denies palpitations. She has not yet had her medications today according to the nursing home. She is concerned about the lower extremity swelling.    Past Medical History  Diagnosis Date  . Rash     [name] rash  . Anxiety   . Thyroid disease   . Hyperlipidemia   . Hypertension   . COPD (chronic obstructive pulmonary disease)   . Poor historian   . Carotid artery disease   . Atrial fibrillation 02/11/2015  . Chronic respiratory failure with hypoxia 02/11/2015  . 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 Collagen vascular disease   . CHF (congestive heart failure)     Past Surgical History  Procedure Laterality Date  . Back surgery       Current Outpatient Prescriptions  Medication Sig Dispense Refill  . atorvastatin (LIPITOR) 20 MG tablet Take 20 mg by mouth daily.    . calcitonin, salmon, (MIACALCIN/FORTICAL) 200 UNIT/ACT nasal spray Place 1 spray into alternate nostrils daily.    .  cetirizine (ZYRTEC) 10 MG tablet Take 10 mg by mouth 2 (two) times daily.     . citalopram (CELEXA) 20 MG tablet Take 20 mg by mouth daily.    . cloNIDine (CATAPRES) 0.1 MG tablet Take 0.1 mg by mouth 2 (two) times daily as needed (use if SBP > 200).    Marland Kitchen diltiazem (CARDIZEM CD) 240 MG 24 hr capsule Take 240 mg by mouth daily.    . divalproex (DEPAKOTE SPRINKLE) 125 MG capsule Take 125 mg by mouth 2 (two) times daily.    . fentaNYL (DURAGESIC - DOSED MCG/HR) 12 MCG/HR Place 12.5 mcg onto the skin every 3 (three) days.    . ferrous sulfate 325 (65 FE) MG tablet Take 1 tablet (325 mg total) by mouth 3 (three) times daily with meals. 90 tablet 0  . folic acid (FOLVITE) 1 MG tablet Take 1 mg by mouth daily.    . furosemide (LASIX) 20 MG tablet Take 1 tablet (20 mg total) by mouth every other day. Alternate with 40 mg 30 tablet 0  . HYDROcodone-acetaminophen (NORCO/VICODIN) 5-325 MG per tablet Take 1 tablet by mouth 3 (three) times daily as needed for moderate pain.    Marland Kitchen levalbuterol (XOPENEX) 0.63 MG/3ML nebulizer solution Take 3 mLs (0.63 mg total) by nebulization every 6 (six) hours as needed for wheezing or shortness of breath. 3 mL 12  . levothyroxine (SYNTHROID, LEVOTHROID) 125 MCG tablet Take 125 mcg by mouth daily before breakfast.    . loratadine (CLARITIN)  10 MG tablet Take 10 mg by mouth daily.    Marland Kitchen LORazepam (ATIVAN) 0.5 MG tablet Take 1 tablet (0.5 mg total) by mouth 2 (two) times daily as needed for anxiety. 10 tablet 0  . methotrexate 2.5 MG tablet Take 10 mg by mouth once a week. On Sunday.    . metoprolol (LOPRESSOR) 25 MG tablet Take 1 tablet (25 mg total) by mouth 2 (two) times daily.    . minocycline (MINOCIN,DYNACIN) 100 MG capsule Take 100 mg by mouth 2 (two) times daily.    . traZODone (DESYREL) 150 MG tablet Take 150 mg by mouth at bedtime.     . vitamin C (ASCORBIC ACID) 500 MG tablet Take 500 mg by mouth daily. For 60 days stop 06-22-15    . Vitamin D, Ergocalciferol, (DRISDOL)  50000 UNITS CAPS capsule Take 50,000 Units by mouth every 7 (seven) days. Take on thursdays    . zinc sulfate 220 MG capsule Take 220 mg by mouth daily. For 60 days stop 06-22-15     No current facility-administered medications for this visit.    Allergies:   Ambien; Sulfa antibiotics; and Clindamycin/lincomycin    Social History:  The patient  reports that she has quit smoking. She does not have any smokeless tobacco history on file. She reports that she does not drink alcohol or use illicit drugs.   Family History:  The patient's family history includes COPD in her sister; Cancer in her mother; Colon cancer in her brother; Emphysema in her sister; Other in her brother; Stroke in her father.    ROS:  Please see the history of present illness.   Otherwise, review of systems are positive for cough, anxiety, ankle swelling, leg pain, hearing loss..   All other systems are reviewed and negative.    PHYSICAL EXAM: VS:  BP 208/66 mmHg  Pulse 86  Ht  (1.626 m)  Wt 79.833 kg (176 lb)  BMI 30.20 kg/m2  SpO2 99% , BMI Body mass index is 30.2 kg/(m^2). GEN: Well nourished, well developed, in no acute distress HEENT: normal Neck: no JVD. There is a loud right carotid bruit. No JVD is noted.  Cardiac: RRR; no murmurs, rubs, or gallops. There is 3+ bilateral lower extremity edema from ankles to knees. Respiratory:  clear to auscultation bilaterally, normal work of breathing GI: soft, nontender, nondistended, + BS MS: no deformity or atrophy Skin: warm and dry, no rash Neuro:  Strength and sensation are intact Psych: euthymic mood, full affect   EKG:  EKG is not ordered today. The ekg performed in May demonstrated normal sinus rhythm   Recent Labs: 02/09/2015: B Natriuretic Peptide 676.0*; TSH 1.960 04/06/2015: ALT 12* 04/09/2015: BUN 19; Creatinine, Ser 1.15*; Hemoglobin 9.7*; Platelets PLATELET CLUMPS NOTED ON SMEAR, COUNT APPEARS ADEQUATE; Potassium 4.1; Sodium 135    Lipid  Panel    Component Value Date/Time   TRIG 121 02/09/2015 2004      Wt Readings from Last 3 Encounters:  05/05/15 79.833 kg (176 lb)  04/17/15 78.019 kg (172 lb)  04/10/15 77.883 kg (171 lb 11.2 oz)      Other studies Reviewed: Additional studies/ records that were reviewed today include: Reviewed recent hospital discharge and nursing home records from [facility]. Review of the above records demonstrates: Confirm that furosemide doses 20 mg per day. Also had to call the nursing home to confirm this. Also taken metolazone 2.5 mg daily   ASSESSMENT AND PLAN:  Paroxysmal atrial fibrillation -  regular rhythm today suggests normal sinus rhythm  Chronic diastolic CHF (congestive heart failure)- volume overload with marked lower extremity swelling  Chronic obstructive pulmonary disease, unspecified COPD, unspecified chronic bronchitis type  CKD (chronic kidney disease), stage 3 (moderate)- based upon chart. Current data not known.     Current medicines are reviewed at length with the patient today.  The patient has concerns regarding medicines.  The following changes have been made:  She insists that she is on 40 mg of furosemide a day but this is incorrect. I reeducated her. Changes made today include increasing furosemide to 40 mg daily. Continue metolazone 2.5 mg daily. Basic metabolic panel drawn at the nursing home in one week. Daily weights at the nursing home. Instructions to call if she loses 8 pounds or greater. Clinical follow-up in 2 weeks with a basic metabolic panel.   Labs/ tests ordered today include:  No orders of the defined types were placed in this encounter.     Disposition:   FU with HS in 2 weeks  Signed, Lesleigh Noe, MD  05/05/2015 9:26 AM    [facility] [address] [place], [place], [place]  [address] Phone: [phone]; Fax: [phone]

## 2015-05-17 ENCOUNTER — Ambulatory Visit (INDEPENDENT_AMBULATORY_CARE_PROVIDER_SITE_OTHER): Payer: Medicare Other | Admitting: Interventional Cardiology

## 2015-05-17 ENCOUNTER — Encounter: Payer: Self-pay | Admitting: Interventional Cardiology

## 2015-05-17 ENCOUNTER — Other Ambulatory Visit: Payer: Medicare Other | Admitting: *Deleted

## 2015-05-17 VITALS — BP 142/68 | HR 63 | Ht 64.0 in | Wt 174.8 lb

## 2015-05-17 DIAGNOSIS — I482 Chronic atrial fibrillation, unspecified: Secondary | ICD-10-CM

## 2015-05-17 DIAGNOSIS — I5032 Chronic diastolic (congestive) heart failure: Secondary | ICD-10-CM

## 2015-05-17 DIAGNOSIS — J449 Chronic obstructive pulmonary disease, unspecified: Secondary | ICD-10-CM | POA: Diagnosis not present

## 2015-05-17 DIAGNOSIS — N183 Chronic kidney disease, stage 3 (moderate): Secondary | ICD-10-CM | POA: Diagnosis not present

## 2015-05-17 MED ORDER — SPIRONOLACTONE 25 MG PO TABS: 25.0000 mg | ORAL_TABLET | Freq: Every day | ORAL | Status: DC

## 2015-05-17 NOTE — Patient Instructions (Signed)
Medication Instructions:  Your physician recommends that you continue on your current medications as directed. Please refer to the Current Medication list given to you today.   Labwork: Bmet in 1 week (To be drawn @SNF  and results faxed to our office)  Testing/Procedures: None   Follow-Up: Your physician recommends that you schedule a follow-up appointment 07/04/15 @2 :30pm     Any Other Special Instructions Will Be Listed Below (If Applicable). Weigh daily. Call the office in 1 week with an update on you weight

## 2015-05-17 NOTE — Progress Notes (Signed)
Cardiology Office Note   Date:  05/17/2015   ID:  Sierra Singleton, Sierra Singleton 11-21-1933, MRN [id number]  PCP:  Ignatius Specking., MD  Cardiologist:  Lesleigh Noe, MD   Chief Complaint  Patient presents with  . Congestive Heart Failure      History of Present Illness: Sierra Singleton is a 81 y.o. female who presents for diastolic heart failure, anasarca, and confusion concerning diaphoretic regimen. Seen 10 days ago and furosemide increased to 40 mg per day. The patient is continued metolazone 2.5 mg per day. At the time of the medication change she had severe lower extremity edema. She was also complaining of shortness of breath.  She has lost 2 pounds since her last office visit. Aldactone is been added I believe by Dr. Jackelyn Knife. Dyspnea is slightly improved.   Past Medical History  Diagnosis Date  . Rash     [name] rash  . Anxiety   . Thyroid disease   . Hyperlipidemia   . Hypertension   . COPD (chronic obstructive pulmonary disease)   . Poor historian   . Carotid artery disease   . Atrial fibrillation 02/11/2015  . Chronic respiratory failure with hypoxia 02/11/2015  . 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 Collagen vascular disease   . CHF (congestive heart failure)     Past Surgical History  Procedure Laterality Date  . Back surgery       Current Outpatient Prescriptions  Medication Sig Dispense Refill  . atorvastatin (LIPITOR) 20 MG tablet Take 20 mg by mouth daily.    . calcitonin, salmon, (MIACALCIN/FORTICAL) 200 UNIT/ACT nasal spray Place 1 spray into alternate nostrils daily.    . cetirizine (ZYRTEC) 10 MG tablet Take 10 mg by mouth 2 (two) times daily.     . citalopram (CELEXA) 20 MG tablet Take 20 mg by mouth daily.    . cloNIDine (CATAPRES) 0.1 MG tablet Take 0.1 mg by mouth 2 (two) times daily as needed (use if SBP > 200).    Marland Kitchen diltiazem (CARDIZEM CD) 240 MG 24 hr capsule Take 240 mg by mouth daily.    . divalproex (DEPAKOTE SPRINKLE) 125 MG  capsule Take 125 mg by mouth 2 (two) times daily.    . fentaNYL (DURAGESIC - DOSED MCG/HR) 12 MCG/HR Place 12.5 mcg onto the skin every 3 (three) days.    . ferrous sulfate 325 (65 FE) MG tablet Take 1 tablet (325 mg total) by mouth 3 (three) times daily with meals. 90 tablet 0  . folic acid (FOLVITE) 1 MG tablet Take 1 mg by mouth daily.    . furosemide (LASIX) 20 MG tablet Take 1 tablet (20 mg total) by mouth every other day. Alternate with 40 mg 30 tablet 0  . HYDROcodone-acetaminophen (NORCO/VICODIN) 5-325 MG per tablet Take 1 tablet by mouth 3 (three) times daily as needed for moderate pain.    Marland Kitchen levalbuterol (XOPENEX) 0.63 MG/3ML nebulizer solution Take 3 mLs (0.63 mg total) by nebulization every 6 (six) hours as needed for wheezing or shortness of breath. 3 mL 12  . levothyroxine (SYNTHROID, LEVOTHROID) 125 MCG tablet Take 125 mcg by mouth daily before breakfast.    . loratadine (CLARITIN) 10 MG tablet Take 10 mg by mouth daily.    Marland Kitchen LORazepam (ATIVAN) 0.5 MG tablet Take 1 tablet (0.5 mg total) by mouth 2 (two) times daily as needed for anxiety. 10 tablet 0  . methotrexate 2.5 MG tablet Take 10 mg by mouth once  a week. On Sunday.    . metoprolol (LOPRESSOR) 25 MG tablet Take 1 tablet (25 mg total) by mouth 2 (two) times daily.    . minocycline (MINOCIN,DYNACIN) 100 MG capsule Take 100 mg by mouth 2 (two) times daily.    Marland Kitchen spironolactone (ALDACTONE) 25 MG tablet Take 1 tablet (25 mg total) by mouth daily.    . traZODone (DESYREL) 150 MG tablet Take 150 mg by mouth at bedtime.     . vitamin C (ASCORBIC ACID) 500 MG tablet Take 500 mg by mouth daily. For 60 days stop 06-22-15    . Vitamin D, Ergocalciferol, (DRISDOL) 50000 UNITS CAPS capsule Take 50,000 Units by mouth every 7 (seven) days. Take on thursdays    . zinc sulfate 220 MG capsule Take 220 mg by mouth daily. For 60 days stop 06-22-15     No current facility-administered medications for this visit.    Allergies:   Ambien; Sulfa  antibiotics; and Clindamycin/lincomycin    Social History:  The patient  reports that she has quit smoking. She does not have any smokeless tobacco history on file. She reports that she does not drink alcohol or use illicit drugs.   Family History:  The patient's family history includes COPD in her sister; Cancer in her mother; Colon cancer in her brother; Emphysema in her sister; Other in her brother; Stroke in her father.    ROS:  Please see the history of present illness.   Otherwise, review of systems are positive for latex uncomfortable because of edema.   All other systems are reviewed and negative.    PHYSICAL EXAM: VS:  BP 142/68 mmHg  Pulse 63  Ht 5\' 4"  (1.626 m)  Wt 79.289 kg (174 lb 12.8 oz)  BMI 29.99 kg/m2  SpO2 98% , BMI Body mass index is 29.99 kg/(m^2). GEN: Well nourished, well developed, in no acute distress HEENT: normal Neck: no JVD, carotid bruits, or masses Cardiac: RRR; no murmurs, rubs, or gallops,no edema  Respiratory:  clear to auscultation bilaterally, normal work of breathing GI: soft, nontender, nondistended, + BS MS: no deformity or atrophy Skin: warm and dry, no rash Neuro:  Strength and sensation are intact Psych: euthymic mood, full affect   EKG:  EKG is not ordered today.    Recent Labs: 02/09/2015: B Natriuretic Peptide 676.0*; TSH 1.960 04/06/2015: ALT 12* 04/09/2015: BUN 19; Creatinine, Ser 1.15*; Hemoglobin 9.7*; Platelets PLATELET CLUMPS NOTED ON SMEAR, COUNT APPEARS ADEQUATE; Potassium 4.1; Sodium 135    Lipid Panel    Component Value Date/Time   TRIG 121 02/09/2015 2004      Wt Readings from Last 3 Encounters:  05/17/15 79.289 kg (174 lb 12.8 oz)  05/05/15 79.833 kg (176 lb)  04/17/15 78.019 kg (172 lb)      Other studies Reviewed: Additional studies/ records that were reviewed today include: .   ASSESSMENT AND PLAN:  Chronic diastolic CHF (congestive heart failure) - still volume overloaded, mostly with lower extremity  edema. Very little weight loss after adding additional furosemide. I believe Dr. Katheren Puller has added spironolactone 25 mg daily  Chronic atrial fibrillation -controlled rate  CKD (chronic kidney disease), stage 3 (moderate) -BUN and creatinine drawn on 05/16/15 is 41 and 1.25. Potassium 3.3  Chronic obstructive pulmonary disease, unspecified COPD, unspecified chronic bronchitis type    Current medicines are reviewed at length with the patient today.  The patient does not have concerns regarding medicines.  The following changes have been made:  Decrease  metolazone to Monday Wednesday and Friday. Continue Aldactone daily and furosemide daily. Basic metabolic panel in one week. Weight in 1 week. Further adjustment in diarrhetic regimen based upon weight loss and blood work  Labs/ tests ordered today include:  No orders of the defined types were placed in this encounter.     Disposition:   FU with hs in 6 weeks  Signed, Lesleigh Noe, MD  05/17/2015 3:24 PM    [facility] [address] [place], [place], [place]  [address] Phone: [phone]; Fax: [phone]

## 2015-05-24 ENCOUNTER — Telehealth: Payer: Self-pay | Admitting: Interventional Cardiology

## 2015-05-24 NOTE — Telephone Encounter (Signed)
NEw message      Facility call with pt weight which is 172 pounds and also has lab results.  Please call for lab results.   If [facility] is [facility] at facility [facility]

## 2015-05-24 NOTE — Telephone Encounter (Signed)
Called Misty StanleyLisa the nurse at [facility]. She will fax patient's lab to our office. The nurse also reported that patient's weight is 172 lbs and there is no difference in the swelling of BLE. Patient's weight was 174 lbs at last office visit. Will forward to Dr. Katrinka BlazingSmith so he is aware.

## 2015-05-26 ENCOUNTER — Encounter: Payer: Self-pay | Admitting: Interventional Cardiology

## 2015-06-26 ENCOUNTER — Ambulatory Visit (HOSPITAL_COMMUNITY)
Admission: RE | Admit: 2015-06-26 | Discharge: 2015-06-26 | Disposition: A | Discharge status: Home / Self Care | Payer: Medicare Other | Source: Ambulatory Visit | Attending: Pulmonary Disease | Admitting: Pulmonary Disease

## 2015-06-26 DIAGNOSIS — N261 Atrophy of kidney (terminal): Secondary | ICD-10-CM | POA: Diagnosis not present

## 2015-06-26 DIAGNOSIS — R918 Other nonspecific abnormal finding of lung field: Secondary | ICD-10-CM | POA: Diagnosis not present

## 2015-06-26 DIAGNOSIS — J439 Emphysema, unspecified: Secondary | ICD-10-CM | POA: Insufficient documentation

## 2015-06-26 DIAGNOSIS — I700 Atherosclerosis of aorta: Secondary | ICD-10-CM | POA: Diagnosis not present

## 2015-06-26 DIAGNOSIS — I2510 Atherosclerotic heart disease of native coronary artery without angina pectoris: Secondary | ICD-10-CM | POA: Insufficient documentation

## 2015-07-03 NOTE — Progress Notes (Signed)
Cardiology Office Note   Date:  07/04/2015   ID:  Sierra Singleton, Sierra Singleton 01/27/1933, MRN [id number]  PCP:  Ignatius Specking., MD  Cardiologist:  Lesleigh Noe, MD   Chief Complaint  Patient presents with  . Congestive Heart Failure      History of Present Illness: Sierra Singleton is a 81 y.o. female who presents for persistent atrial fibrillation, diastolic heart failure, lower extremity edema, COPD, chronic O2 requirement.  On the current medical regimen which includes metolazone 3 times per week, Aldactone daily and furosemide, the patient has lost approximately 10 pounds since last office visit and lower extremity edema has completely resolved. Breathing is not improved predominantly because it is related to COPD. She denies angina. There is no orthopnea. She has not had syncope. Appetite is stable.    Past Medical History  Diagnosis Date  . Rash     [name] rash  . Anxiety   . Thyroid disease   . Hyperlipidemia   . Hypertension   . COPD (chronic obstructive pulmonary disease)   . Poor historian   . Carotid artery disease   . Atrial fibrillation 02/11/2015  . Chronic respiratory failure with hypoxia 02/11/2015  . 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 Collagen vascular disease   . CHF (congestive heart failure)     Past Surgical History  Procedure Laterality Date  . Back surgery       Current Outpatient Prescriptions  Medication Sig Dispense Refill  . Amino Acids-Protein Hydrolys (FEEDING SUPPLEMENT, PRO-STAT SUGAR FREE 64,) LIQD Take 30 mLs by mouth 2 (two) times daily.    Marland Kitchen atorvastatin (LIPITOR) 20 MG tablet Take 20 mg by mouth daily.    . calcitonin, salmon, (MIACALCIN/FORTICAL) 200 UNIT/ACT nasal spray Place 1 spray into alternate nostrils daily.    . cetirizine (ZYRTEC) 10 MG tablet Take 10 mg by mouth 2 (two) times daily.     . citalopram (CELEXA) 20 MG tablet Take 20 mg by mouth daily.    . clonazePAM (KLONOPIN) 0.5 MG tablet Take 0.5 mg by mouth 2 (two)  times daily as needed for anxiety (Shortness of breath).    . cloNIDine (CATAPRES) 0.1 MG tablet Take 0.1 mg by mouth 2 (two) times daily as needed (use if SBP > 200).    Marland Kitchen diltiazem (CARDIZEM CD) 240 MG 24 hr capsule Take 240 mg by mouth daily.    . divalproex (DEPAKOTE SPRINKLE) 125 MG capsule Take 125 mg by mouth 2 (two) times daily.    . fentaNYL (DURAGESIC - DOSED MCG/HR) 12 MCG/HR Place 12.5 mcg onto the skin every 3 (three) days.    . ferrous sulfate 325 (65 FE) MG tablet Take 325 mg by mouth daily.    . folic acid (FOLVITE) 1 MG tablet Take 1 mg by mouth daily.    . furosemide (LASIX) 40 MG tablet Take 40 mg by mouth daily.    Marland Kitchen HYDROcodone-acetaminophen (NORCO/VICODIN) 5-325 MG per tablet Take 1 tablet by mouth 3 (three) times daily as needed for moderate pain.    . hydrOXYzine (ATARAX/VISTARIL) 25 MG tablet Take 25 mg by mouth 3 (three) times daily as needed for itching.    . levalbuterol (XOPENEX) 0.63 MG/3ML nebulizer solution Take 3 mLs (0.63 mg total) by nebulization every 6 (six) hours as needed for wheezing or shortness of breath. 3 mL 12  . levothyroxine (SYNTHROID, LEVOTHROID) 125 MCG tablet Take 125 mcg by mouth daily before breakfast.    . loratadine (  CLARITIN) 10 MG tablet Take 10 mg by mouth daily.    Marland Kitchen LORazepam (ATIVAN) 1 MG tablet Take 1 mg by mouth every 12 (twelve) hours as needed for anxiety.    . methotrexate 2.5 MG tablet Take 10 mg by mouth once a week. On Sunday.    . metolazone (ZAROXOLYN) 2.5 MG tablet Take 1 tablet (2.5 mg total) by mouth on Monday, Wednesday, and Friday 1 hour before lasix    . metoprolol (LOPRESSOR) 25 MG tablet Take 1 tablet (25 mg total) by mouth 2 (two) times daily.    . minocycline (MINOCIN,DYNACIN) 100 MG capsule Take 100 mg by mouth 2 (two) times daily.    Marland Kitchen spironolactone (ALDACTONE) 25 MG tablet Take 1 tablet (25 mg total) by mouth daily.    . traZODone (DESYREL) 150 MG tablet Take 150 mg by mouth at bedtime.     Marland Kitchen UNABLE TO FIND Take  6 oz by mouth 3 (three) times daily. Med Name: MED PASS    . Vitamin D, Ergocalciferol, (DRISDOL) 50000 UNITS CAPS capsule Take 50,000 Units by mouth every 7 (seven) days. Take on thursdays     No current facility-administered medications for this visit.    Allergies:   Ambien; Sulfa antibiotics; and Clindamycin/lincomycin    Social History:  The patient  reports that she has quit smoking. She does not have any smokeless tobacco history on file. She reports that she does not drink alcohol or use illicit drugs.   Family History:  The patient's family history includes COPD in her sister; Cancer in her mother; Colon cancer in her brother; Emphysema in her sister; Other in her brother; Stroke in her father.    ROS:  Please see the history of present illness.   Otherwise, review of systems are positive for leg discomfort although much improved sense resolution of edema. Appetite is been stable..   All other systems are reviewed and negative.    PHYSICAL EXAM: VS:  BP 110/60 mmHg  Pulse 81  Ht 5\' 3"  (1.6 m)  Wt 76.204 kg (168 lb)  BMI 29.77 kg/m2  SpO2 92% , BMI Body mass index is 29.77 kg/(m^2). GEN: Well nourished, well developed, in no acute distress HEENT: normal Neck: no JVD, carotid bruits, or masses Cardiac: IIRR; no murmurs, rubs, or gallops, with trace -1+ with left leg greater than right. Respiratory:  clear to auscultation bilaterally, normal work of breathing GI: soft, nontender, nondistended, + BS MS: no deformity or atrophy Skin: warm and dry, no rash Neuro:  Strength and sensation are intact Psych: euthymic mood, full affect   EKG:  EKG is not ordered today.    Recent Labs: 02/09/2015: B Natriuretic Peptide 676.0*; TSH 1.960 04/06/2015: ALT 12* 04/09/2015: BUN 19; Creatinine, Ser 1.15*; Hemoglobin 9.7*; Platelets PLATELET CLUMPS NOTED ON SMEAR, COUNT APPEARS ADEQUATE; Potassium 4.1; Sodium 135    Lipid Panel    Component Value Date/Time   TRIG 121 02/09/2015  2004      Wt Readings from Last 3 Encounters:  07/04/15 76.204 kg (168 lb)  05/17/15 79.289 kg (174 lb 12.8 oz)  05/05/15 79.833 kg (176 lb)      Other studies Reviewed: Additional studies/ records that were reviewed today include: .   ASSESSMENT AND PLAN:  1. Chronic diastolic CHF (congestive heart failure) Improved with reduction in lower extremity edema  2. Chronic atrial fibrillation Moderate rate control  3. CKD (chronic kidney disease), stage 3 (moderate) Current status unknown  4. Other emphysema  Chronic O2 therapy    Current medicines are reviewed at length with the patient today.  The patient does not have concerns regarding medicines.  The following changes have been made:  Diuretic adjustment may become necessary depending upon today's laboratory data. Not a we have achieved euvolemic, we may consider decreasing metolazone and or Aldactone.  Labs/ tests ordered today include:  Orders Placed This Encounter  Procedures  . Basic metabolic panel     Disposition:   FU with HS in 6 months  Signed, Lesleigh Noe, MD  07/04/2015 3:18 PM    [facility] [address] [place], [place], [place]  [address] Phone: [phone]; Fax: [phone]

## 2015-07-04 ENCOUNTER — Ambulatory Visit (INDEPENDENT_AMBULATORY_CARE_PROVIDER_SITE_OTHER): Payer: Medicare Other | Admitting: Interventional Cardiology

## 2015-07-04 ENCOUNTER — Encounter: Payer: Self-pay | Admitting: Interventional Cardiology

## 2015-07-04 VITALS — BP 110/60 | HR 81 | Ht 63.0 in | Wt 168.0 lb

## 2015-07-04 DIAGNOSIS — N183 Chronic kidney disease, stage 3 (moderate): Secondary | ICD-10-CM | POA: Diagnosis not present

## 2015-07-04 DIAGNOSIS — I482 Chronic atrial fibrillation, unspecified: Secondary | ICD-10-CM

## 2015-07-04 DIAGNOSIS — I5032 Chronic diastolic (congestive) heart failure: Secondary | ICD-10-CM | POA: Diagnosis not present

## 2015-07-04 DIAGNOSIS — J438 Other emphysema: Secondary | ICD-10-CM

## 2015-07-04 LAB — BASIC METABOLIC PANEL
BUN: 68 mg/dL — AB (ref 6–23)
CALCIUM: 9.5 mg/dL (ref 8.4–10.5)
CHLORIDE: 96 meq/L (ref 96–112)
CO2: 38 mEq/L — ABNORMAL HIGH (ref 19–32)
CREATININE: 2.01 mg/dL — AB (ref 0.40–1.20)
GFR: 25.32 mL/min — ABNORMAL LOW (ref >60.00)
GLUCOSE: 111 mg/dL — AB (ref 70–99)
Potassium: 4.5 mEq/L (ref 3.5–5.1)
Sodium: 140 mEq/L (ref 135–145)

## 2015-07-04 NOTE — Patient Instructions (Signed)
Medication Instructions:  Your physician recommends that you continue on your current medications as directed. Please refer to the Current Medication list given to you today.   Labwork: Bmet today  Testing/Procedures: None   Follow-Up: Your physician wants you to follow-up in: 4-6 months  You will receive a reminder letter in the mail two months in advance. If you don't receive a letter, please call our office to schedule the follow-up appointment.   Any Other Special Instructions Will Be Listed Below (If Applicable).

## 2015-07-05 ENCOUNTER — Telehealth: Payer: Self-pay

## 2015-07-05 NOTE — Telephone Encounter (Signed)
-----   Message from Lyn Records, MD sent at 07/04/2015  7:41 PM EDT ----- The patient has volume contraction. Decrease metolazone to once per week, each Monday. Discontinue Aldactone. Basic metabolic panel in 2 weeks.

## 2015-07-05 NOTE — Telephone Encounter (Signed)
Spoke with Misty Stanley ,RN the nurse that takes care of Sierra Singleton @ [facility] She is aware of pt lab results and Dr.Smith's recommendation. The patient has volume contraction. Decrease metolazone to once per week, each Monday. Discontinue Aldactone. Basic metabolic panel in 2 weeks.  Written order faxed to [facility] fax # [phone]  They will draw a bmet there in 2 weeks and fax results to our office  Lmom. For pt on tel# listed in her chart

## 2015-07-19 ENCOUNTER — Encounter: Payer: Self-pay | Admitting: *Deleted

## 2015-08-31 ENCOUNTER — Telehealth: Payer: Self-pay | Admitting: Pulmonary Disease

## 2015-08-31 NOTE — Telephone Encounter (Signed)
Reviewed Cardio notes in patient's chart to find contact information to discuss CT scan results with patient. Noted that patient is currently in the care of [facility] and spoke with pt's evening nurse, Johny Shock, RN She has been notified of CT results.  She asked that I fax a copy of the CT results to her so she can give them to her doctor at the home and see if they can make arrangements for patient to be seen by Dr. Vassie Loll.   Faxed copy of CT results to [facility]. Awaiting call back to discuss follow up arrangements.

## 2015-09-04 NOTE — Telephone Encounter (Signed)
Karie Kirks called back and said that she faxed a copy of the CT results to Dr. Jarold Motto and their office will contact me to discuss follow up plans. Awaiting call back from Dr. Norval Gable office.  fyi to Dr. Vassie Loll

## 2015-09-04 NOTE — Telephone Encounter (Signed)
Spoke with Karie Kirks at [facility]  She said that she would contact our office once she has spoken to the provider to arrange follow up appointment. Awaiting call back from [facility] to confirm appointment.

## 2015-09-11 ENCOUNTER — Ambulatory Visit (INDEPENDENT_AMBULATORY_CARE_PROVIDER_SITE_OTHER): Payer: Medicare Other | Admitting: Adult Health

## 2015-09-11 ENCOUNTER — Encounter: Payer: Self-pay | Admitting: Adult Health

## 2015-09-11 VITALS — BP 108/70 | HR 72 | Temp 98.2°F | Ht 64.0 in | Wt 160.0 lb

## 2015-09-11 DIAGNOSIS — J449 Chronic obstructive pulmonary disease, unspecified: Secondary | ICD-10-CM | POA: Diagnosis not present

## 2015-09-11 DIAGNOSIS — R918 Other nonspecific abnormal finding of lung field: Secondary | ICD-10-CM | POA: Diagnosis not present

## 2015-09-11 DIAGNOSIS — J9611 Chronic respiratory failure with hypoxia: Secondary | ICD-10-CM

## 2015-09-11 NOTE — Telephone Encounter (Signed)
Seen by TP 10/17 and discussed

## 2015-09-11 NOTE — Progress Notes (Signed)
   Subjective:    Patient ID: Sierra Singleton, female    DOB: [birth year], 81 y.o.   MRN: [id number]  HPI 81 yo with COPD  On O2 , D-CHF and Atrial Fib   09/11/2015 Follow up : COPD, O2 dependent , lung mass  Pt returns for 4 month follow up . She is a SNF resident .  She remains on O2 at 2l/m  Uses xopenex neb every 6hrs as needed.  A lung nodule was seen on CT abd in May . Subsequent CT chest done 06/26/15 showed irregular mass in RLL with no sign change, prominent anterior mediastinal lymph node.  Discussed these results with pt and son.  Discussed proceeding with PET scan.  She is inactive on O2 in wheelchair . Requires assistance with transfer. Says she can not walk .  She is on multiple medications.     Review of Systems Constitutional:   No  weight loss, night sweats,  Fevers, chills,  +fatigue, or  lassitude.  HEENT:   No headaches,  Difficulty swallowing,  Tooth/dental problems, or  Sore throat,                No sneezing, itching, ear ache, nasal congestion, post nasal drip,   CV:  No chest pain,  Orthopnea, PND, swelling in lower extremities, anasarca, dizziness, palpitations, syncope.   GI  No heartburn, indigestion, abdominal pain, nausea, vomiting, diarrhea, change in bowel habits, loss of appetite, bloody stools.   Resp:  .  No chest wall deformity  Skin: no rash or lesions.  GU: no dysuria, change in color of urine, no urgency or frequency.  No flank pain, no hematuria   MS:  No joint pain or swelling.  No decreased range of motion.  No back pain.  Psych:  No change in mood or affect. No depression or anxiety.  No memory loss.         Objective:   Physical Exam  GEN: A/Ox3; pleasant , NAD, chronically ill appearing on o2 in wc   HEENT:  [place]/AT,  EACs-clear, TMs-wnl, NOSE-clear, THROAT-clear, no lesions, no postnasal drip or exudate noted.   NECK:  Supple w/ fair ROM; no JVD; normal carotid impulses w/o bruits; no thyromegaly or nodules palpated; no  lymphadenopathy.  RESP  Decreased BS in bases no accessory muscle use, no dullness to percussion  CARD:  RRR, no m/r/g  , tr  peripheral edema, pulses intact, no cyanosis or clubbing.  GI:   Soft & nt; nml bowel sounds; no organomegaly or masses detected.  Musco: Warm bil, no deformities or joint swelling noted.   Neuro: alert, no focal deficits noted.    Skin: Warm, no lesions or rashes  CT  Chest 06/2015> The slightly irregular masslike area in the anterior segment of the right lower lobe persists, not appreciably changed compared to prior study from approximately 11 weeks prior. Given the persistence of this finding, neoplasm must be of concern. This finding may well warrant nuclear medicine PET study to further evaluate.  Smaller nodular lesions are noted elsewhere in the lungs, largest measuring 6 mm.  Mild bibasilar atelectatic change. Underlying emphysema.  Extensive atherosclerotic calcification. Multiple foci of coronary artery calcification.  Mildly prominent anterior mediastinal lymph node. Smaller lymph nodes elsewhere do not meet size criteria for pathologic significance.        Assessment & Plan:

## 2015-09-11 NOTE — Patient Instructions (Signed)
We are going to set you up for PET scan .  We will call with these results.  follow up Dr. Vassie LollAlva  In 3 months and As needed

## 2015-09-11 NOTE — Assessment & Plan Note (Signed)
Lung mass -suspcious for carcinoma   Plan  Set up for PET scan

## 2015-09-11 NOTE — Assessment & Plan Note (Signed)
Compensated on present regimen.   

## 2015-09-11 NOTE — Assessment & Plan Note (Signed)
Cont on O2 .  

## 2015-09-13 NOTE — Progress Notes (Signed)
Reviewed & agree with plan  

## 2015-09-19 ENCOUNTER — Emergency Department (HOSPITAL_COMMUNITY): Payer: Medicare Other

## 2015-09-19 ENCOUNTER — Encounter (HOSPITAL_COMMUNITY): Payer: Self-pay

## 2015-09-19 ENCOUNTER — Inpatient Hospital Stay (HOSPITAL_COMMUNITY)
Admission: EM | Admit: 2015-09-19 | Discharge: 2015-09-22 | DRG: 291 | Disposition: A | Discharge status: Skilled Nursing Facility | Payer: Medicare Other | Attending: Internal Medicine | Admitting: Internal Medicine

## 2015-09-19 DIAGNOSIS — J449 Chronic obstructive pulmonary disease, unspecified: Secondary | ICD-10-CM | POA: Diagnosis present

## 2015-09-19 DIAGNOSIS — Z881 Allergy status to other antibiotic agents status: Secondary | ICD-10-CM

## 2015-09-19 DIAGNOSIS — Z7901 Long term (current) use of anticoagulants: Secondary | ICD-10-CM

## 2015-09-19 DIAGNOSIS — I130 Hypertensive heart and chronic kidney disease with heart failure and stage 1 through stage 4 chronic kidney disease, or unspecified chronic kidney disease: Principal | ICD-10-CM | POA: Diagnosis present

## 2015-09-19 DIAGNOSIS — Q828 Other specified congenital malformations of skin: Secondary | ICD-10-CM

## 2015-09-19 DIAGNOSIS — R4182 Altered mental status, unspecified: Secondary | ICD-10-CM | POA: Diagnosis present

## 2015-09-19 DIAGNOSIS — E785 Hyperlipidemia, unspecified: Secondary | ICD-10-CM | POA: Diagnosis present

## 2015-09-19 DIAGNOSIS — E876 Hypokalemia: Secondary | ICD-10-CM | POA: Diagnosis present

## 2015-09-19 DIAGNOSIS — R911 Solitary pulmonary nodule: Secondary | ICD-10-CM | POA: Diagnosis present

## 2015-09-19 DIAGNOSIS — R0602 Shortness of breath: Secondary | ICD-10-CM

## 2015-09-19 DIAGNOSIS — Z6827 Body mass index (BMI) 27.0-27.9, adult: Secondary | ICD-10-CM | POA: Diagnosis not present

## 2015-09-19 DIAGNOSIS — R791 Abnormal coagulation profile: Secondary | ICD-10-CM | POA: Diagnosis present

## 2015-09-19 DIAGNOSIS — Z882 Allergy status to sulfonamides status: Secondary | ICD-10-CM

## 2015-09-19 DIAGNOSIS — Z888 Allergy status to other drugs, medicaments and biological substances status: Secondary | ICD-10-CM

## 2015-09-19 DIAGNOSIS — E039 Hypothyroidism, unspecified: Secondary | ICD-10-CM | POA: Diagnosis present

## 2015-09-19 DIAGNOSIS — L89621 Pressure ulcer of left heel, stage 1: Secondary | ICD-10-CM | POA: Diagnosis present

## 2015-09-19 DIAGNOSIS — J9620 Acute and chronic respiratory failure, unspecified whether with hypoxia or hypercapnia: Secondary | ICD-10-CM | POA: Diagnosis present

## 2015-09-19 DIAGNOSIS — Z87891 Personal history of nicotine dependence: Secondary | ICD-10-CM

## 2015-09-19 DIAGNOSIS — Z9981 Dependence on supplemental oxygen: Secondary | ICD-10-CM | POA: Diagnosis not present

## 2015-09-19 DIAGNOSIS — I4891 Unspecified atrial fibrillation: Secondary | ICD-10-CM | POA: Diagnosis present

## 2015-09-19 DIAGNOSIS — I482 Chronic atrial fibrillation: Secondary | ICD-10-CM

## 2015-09-19 DIAGNOSIS — F329 Major depressive disorder, single episode, unspecified: Secondary | ICD-10-CM | POA: Diagnosis present

## 2015-09-19 DIAGNOSIS — D649 Anemia, unspecified: Secondary | ICD-10-CM | POA: Diagnosis present

## 2015-09-19 DIAGNOSIS — N183 Chronic kidney disease, stage 3 (moderate): Secondary | ICD-10-CM

## 2015-09-19 DIAGNOSIS — I5033 Acute on chronic diastolic (congestive) heart failure: Secondary | ICD-10-CM | POA: Diagnosis not present

## 2015-09-19 DIAGNOSIS — Z66 Do not resuscitate: Secondary | ICD-10-CM | POA: Diagnosis present

## 2015-09-19 DIAGNOSIS — E669 Obesity, unspecified: Secondary | ICD-10-CM | POA: Diagnosis present

## 2015-09-19 DIAGNOSIS — I959 Hypotension, unspecified: Secondary | ICD-10-CM | POA: Diagnosis present

## 2015-09-19 DIAGNOSIS — I5032 Chronic diastolic (congestive) heart failure: Secondary | ICD-10-CM | POA: Diagnosis not present

## 2015-09-19 DIAGNOSIS — G8929 Other chronic pain: Secondary | ICD-10-CM | POA: Diagnosis present

## 2015-09-19 DIAGNOSIS — N189 Chronic kidney disease, unspecified: Secondary | ICD-10-CM | POA: Diagnosis present

## 2015-09-19 DIAGNOSIS — J9621 Acute and chronic respiratory failure with hypoxia: Secondary | ICD-10-CM | POA: Diagnosis present

## 2015-09-19 DIAGNOSIS — I10 Essential (primary) hypertension: Secondary | ICD-10-CM | POA: Diagnosis not present

## 2015-09-19 DIAGNOSIS — E46 Unspecified protein-calorie malnutrition: Secondary | ICD-10-CM | POA: Diagnosis present

## 2015-09-19 DIAGNOSIS — L8990 Pressure ulcer of unspecified site, unspecified stage: Secondary | ICD-10-CM | POA: Insufficient documentation

## 2015-09-19 DIAGNOSIS — J9622 Acute and chronic respiratory failure with hypercapnia: Secondary | ICD-10-CM

## 2015-09-19 LAB — I-STAT CG4 LACTIC ACID, ED: Lactic Acid, Venous: 1.08 mmol/L (ref 0.5–2.0)

## 2015-09-19 LAB — URINALYSIS, ROUTINE W REFLEX MICROSCOPIC
BILIRUBIN URINE: NEGATIVE
GLUCOSE, UA: NEGATIVE mg/dL
Ketones, ur: NEGATIVE mg/dL
Leukocytes, UA: NEGATIVE
Nitrite: NEGATIVE
PH: 5.5 (ref 5.0–8.0)
Protein, ur: NEGATIVE mg/dL
SPECIFIC GRAVITY, URINE: 1.011 (ref 1.005–1.030)
Urobilinogen, UA: 0.2 mg/dL (ref 0.0–1.0)

## 2015-09-19 LAB — COMPREHENSIVE METABOLIC PANEL
ALT: 10 U/L — AB (ref 14–54)
AST: 20 U/L (ref 15–41)
Albumin: 1.9 g/dL — ABNORMAL LOW (ref 3.5–5.0)
Alkaline Phosphatase: 67 U/L (ref 38–126)
Anion gap: 7 (ref 5–15)
BILIRUBIN TOTAL: 0.5 mg/dL (ref 0.3–1.2)
BUN: 35 mg/dL — AB (ref 6–20)
CO2: 37 mmol/L — ABNORMAL HIGH (ref 22–32)
CREATININE: 1.3 mg/dL — AB (ref 0.44–1.00)
Calcium: 9 mg/dL (ref 8.9–10.3)
Chloride: 99 mmol/L — ABNORMAL LOW (ref 101–111)
GFR calc Af Amer: 44 mL/min — ABNORMAL LOW (ref >60)
GFR, EST NON AFRICAN AMERICAN: 38 mL/min — AB (ref >60)
Glucose, Bld: 95 mg/dL (ref 65–99)
POTASSIUM: 4.4 mmol/L (ref 3.5–5.1)
Sodium: 143 mmol/L (ref 135–145)
TOTAL PROTEIN: 5.7 g/dL — AB (ref 6.5–8.1)

## 2015-09-19 LAB — CBC
HEMATOCRIT: 28.5 % — AB (ref 36.0–46.0)
Hemoglobin: 8.9 g/dL — ABNORMAL LOW (ref 12.0–15.0)
MCH: 30.6 pg (ref 26.0–34.0)
MCHC: 31.2 g/dL (ref 30.0–36.0)
MCV: 97.9 fL (ref 78.0–100.0)
Platelets: 148 10*3/uL — ABNORMAL LOW (ref 150–400)
RBC: 2.91 MIL/uL — ABNORMAL LOW (ref 3.87–5.11)
RDW: 16.7 % — AB (ref 11.5–15.5)
WBC: 4.1 10*3/uL (ref 4.0–10.5)

## 2015-09-19 LAB — VALPROIC ACID LEVEL: Valproic Acid Lvl: 20 ug/mL — ABNORMAL LOW (ref 50.0–100.0)

## 2015-09-19 LAB — I-STAT VENOUS BLOOD GAS, ED
Acid-Base Excess: 14 mmol/L — ABNORMAL HIGH (ref 0.0–2.0)
Bicarbonate: 39.5 mEq/L — ABNORMAL HIGH (ref 20.0–24.0)
O2 SAT: 51 %
TCO2: 41 mmol/L (ref 0–100)
pCO2, Ven: 57.4 mmHg — ABNORMAL HIGH (ref 45.0–50.0)
pH, Ven: 7.446 — ABNORMAL HIGH (ref 7.250–7.300)
pO2, Ven: 27 mmHg — CL (ref 30.0–45.0)

## 2015-09-19 LAB — PROTIME-INR
INR: 5.2 (ref 0.00–1.49)
PROTHROMBIN TIME: 45.6 s — AB (ref 11.6–15.2)

## 2015-09-19 LAB — URINE MICROSCOPIC-ADD ON

## 2015-09-19 LAB — SAMPLE TO BLOOD BANK

## 2015-09-19 LAB — TROPONIN I: Troponin I: 0.03 ng/mL (ref <0.031)

## 2015-09-19 MED ORDER — DILTIAZEM HCL 25 MG/5ML IV SOLN: 10.0000 mg | Freq: Once | INTRAVENOUS | Status: DC

## 2015-09-19 MED ORDER — DILTIAZEM HCL 100 MG IV SOLR
5.0000 mg/h | Freq: Once | INTRAVENOUS | Status: AC
  Administered 2015-09-19: 5 mg/h via INTRAVENOUS
  Filled 2015-09-19: qty 100

## 2015-09-19 MED ORDER — DILTIAZEM HCL 100 MG IV SOLR: 5.0000 mg/h | Freq: Once | INTRAVENOUS | Status: DC

## 2015-09-19 MED ORDER — FUROSEMIDE 10 MG/ML IJ SOLN
40.0000 mg | Freq: Once | INTRAMUSCULAR | Status: AC
  Administered 2015-09-19: 40 mg via INTRAVENOUS
  Filled 2015-09-19: qty 4

## 2015-09-19 MED ORDER — DILTIAZEM HCL 100 MG IV SOLR
5.0000 mg/h | Freq: Once | INTRAVENOUS | Status: AC
  Administered 2015-09-19: 10 mg/h via INTRAVENOUS

## 2015-09-19 MED ORDER — SODIUM CHLORIDE 0.9 % IV BOLUS (SEPSIS)
500.0000 mL | INTRAVENOUS | Status: AC
  Administered 2015-09-19: 500 mL via INTRAVENOUS

## 2015-09-19 MED ORDER — SODIUM CHLORIDE 0.9 % IV BOLUS (SEPSIS)
1000.0000 mL | INTRAVENOUS | Status: AC
  Administered 2015-09-19 (×2): 1000 mL via INTRAVENOUS

## 2015-09-19 MED ORDER — DILTIAZEM HCL 25 MG/5ML IV SOLN
10.0000 mg | Freq: Once | INTRAVENOUS | Status: AC
  Administered 2015-09-19: 10 mg via INTRAVENOUS
  Filled 2015-09-19: qty 5

## 2015-09-19 MED ORDER — DILTIAZEM HCL 25 MG/5ML IV SOLN
10.0000 mg | Freq: Once | INTRAVENOUS | Status: AC
  Administered 2015-09-19: 10 mg via INTRAVENOUS

## 2015-09-19 MED ORDER — SODIUM CHLORIDE 0.9 % IV SOLN: INTRAVENOUS | Status: DC

## 2015-09-19 NOTE — ED Notes (Signed)
Respiratory called to come bedside due to patient stating she was having shortness of breath.

## 2015-09-19 NOTE — ED Notes (Signed)
Pt brought in with Fentanyl patch, removed by Marquita PalmsMario, RN

## 2015-09-19 NOTE — ED Notes (Signed)
Pt began to state she was having trouble breathing. HR increased to 160s and RR increased to mid 20s and pt having trouble getting words out. Crackles heard bilaterally. MD called to bedside to assess patient. Cardizem, lasix, and Bipap to be ordered. Xray to be repeated. Verbal order from Dr. Sibyl Parrhapman and Dr. Denton LankSteinl.

## 2015-09-19 NOTE — ED Notes (Signed)
Per EMS, [facility]. Facility reports patient fell last Wednesday while transfering from a bedside commode to chair with increasing altered mental status since fall. Reports "talking out of her head" with hallucinations. Facility did not get patient evaluated after fall. Reports hematoma to the back of the head with an unwitnessed fall. Baseline is Alert x2. Initial BP with EMS was 70/48, CBG 141, 122 HR. Last Vitals per EMS: 128/82, 111 HR, 100% on 3 L. Pt wears chronic oxygen, hx of afib, Coumadin, Fentanyl patch, and diastolic heart failure .

## 2015-09-19 NOTE — Progress Notes (Addendum)
Patient placed on BIPAP per MD order on 10/5 and 30%FIO2. Patient is tolerating well sat 98% on 30%FIO2. Patient in no distress. RT will continue to monitor.

## 2015-09-19 NOTE — Progress Notes (Signed)
Patient bipap mask removed by MD. Patient is tolerating 3L [place] well sat 98%. BIPAP is not needed at this time. MD wants BIPAP on when she goes to sleep over night. RT will continue to monitor.

## 2015-09-20 ENCOUNTER — Encounter (HOSPITAL_COMMUNITY): Payer: Self-pay | Admitting: Family Medicine

## 2015-09-20 DIAGNOSIS — J9620 Acute and chronic respiratory failure, unspecified whether with hypoxia or hypercapnia: Secondary | ICD-10-CM | POA: Diagnosis present

## 2015-09-20 LAB — BLOOD GAS, ARTERIAL
Acid-Base Excess: 8.5 mmol/L — ABNORMAL HIGH (ref 0.0–2.0)
BICARBONATE: 33 meq/L — AB (ref 20.0–24.0)
DRAWN BY: 418751
Delivery systems: POSITIVE
Expiratory PAP: 5
FIO2: 0.4
Inspiratory PAP: 10
O2 SAT: 93.3 %
PATIENT TEMPERATURE: 98.8
PO2 ART: 69.4 mmHg — AB (ref 80.0–100.0)
TCO2: 34.5 mmol/L (ref 0–100)
pCO2 arterial: 49.9 mmHg — ABNORMAL HIGH (ref 35.0–45.0)
pH, Arterial: 7.436 (ref 7.350–7.450)

## 2015-09-20 LAB — URINE CULTURE

## 2015-09-20 LAB — CBC
HCT: 25.9 % — ABNORMAL LOW (ref 36.0–46.0)
Hemoglobin: 8.1 g/dL — ABNORMAL LOW (ref 12.0–15.0)
MCH: 30.6 pg (ref 26.0–34.0)
MCHC: 31.3 g/dL (ref 30.0–36.0)
MCV: 97.7 fL (ref 78.0–100.0)
Platelets: 140 10*3/uL — ABNORMAL LOW (ref 150–400)
RBC: 2.65 MIL/uL — AB (ref 3.87–5.11)
RDW: 16.5 % — ABNORMAL HIGH (ref 11.5–15.5)
WBC: 3.9 10*3/uL — AB (ref 4.0–10.5)

## 2015-09-20 LAB — BASIC METABOLIC PANEL
Anion gap: 14 (ref 5–15)
BUN: 25 mg/dL — ABNORMAL HIGH (ref 6–20)
CHLORIDE: 97 mmol/L — AB (ref 101–111)
CO2: 30 mmol/L (ref 22–32)
CREATININE: 1.1 mg/dL — AB (ref 0.44–1.00)
Calcium: 8.5 mg/dL — ABNORMAL LOW (ref 8.9–10.3)
GFR calc non Af Amer: 46 mL/min — ABNORMAL LOW (ref >60)
GFR, EST AFRICAN AMERICAN: 53 mL/min — AB (ref >60)
Glucose, Bld: 101 mg/dL — ABNORMAL HIGH (ref 65–99)
Potassium: 3.8 mmol/L (ref 3.5–5.1)
SODIUM: 141 mmol/L (ref 135–145)

## 2015-09-20 LAB — PROTIME-INR
INR: 5.94 — AB (ref 0.00–1.49)
PROTHROMBIN TIME: 51.1 s — AB (ref 11.6–15.2)

## 2015-09-20 LAB — TSH: TSH: 0.567 u[IU]/mL (ref 0.350–4.500)

## 2015-09-20 LAB — MRSA PCR SCREENING: MRSA by PCR: POSITIVE — AB

## 2015-09-20 MED ORDER — FLUCONAZOLE 150 MG PO TABS
150.0000 mg | ORAL_TABLET | Freq: Every day | ORAL | Status: DC
  Administered 2015-09-20: 150 mg via ORAL
  Filled 2015-09-20: qty 1

## 2015-09-20 MED ORDER — FUROSEMIDE 40 MG PO TABS
40.0000 mg | ORAL_TABLET | Freq: Every day | ORAL | Status: DC
  Administered 2015-09-20 – 2015-09-22 (×3): 40 mg via ORAL
  Filled 2015-09-20 (×3): qty 1

## 2015-09-20 MED ORDER — DIVALPROEX SODIUM 125 MG PO CSDR
125.0000 mg | DELAYED_RELEASE_CAPSULE | Freq: Two times a day (BID) | ORAL | Status: DC
  Administered 2015-09-20 – 2015-09-22 (×6): 125 mg via ORAL
  Filled 2015-09-20 (×6): qty 1

## 2015-09-20 MED ORDER — WARFARIN - PHARMACIST DOSING INPATIENT: Freq: Every day | Status: DC

## 2015-09-20 MED ORDER — FENTANYL 12 MCG/HR TD PT72
12.5000 ug | MEDICATED_PATCH | TRANSDERMAL | Status: DC
  Administered 2015-09-20: 12.5 ug via TRANSDERMAL
  Filled 2015-09-20: qty 1

## 2015-09-20 MED ORDER — CHLORHEXIDINE GLUCONATE CLOTH 2 % EX PADS: 6.0000 | MEDICATED_PAD | Freq: Every day | CUTANEOUS | Status: DC

## 2015-09-20 MED ORDER — SODIUM CHLORIDE 0.9 % IJ SOLN
3.0000 mL | Freq: Two times a day (BID) | INTRAMUSCULAR | Status: DC
  Administered 2015-09-20 – 2015-09-22 (×4): 3 mL via INTRAVENOUS

## 2015-09-20 MED ORDER — HYDROCODONE-ACETAMINOPHEN 5-325 MG PO TABS
1.0000 | ORAL_TABLET | Freq: Three times a day (TID) | ORAL | Status: DC
  Administered 2015-09-20 – 2015-09-22 (×7): 1 via ORAL
  Filled 2015-09-20 (×8): qty 1

## 2015-09-20 MED ORDER — FENTANYL 12 MCG/HR TD PT72: 12.5000 ug | MEDICATED_PATCH | TRANSDERMAL | Status: DC

## 2015-09-20 MED ORDER — PHYTONADIONE 5 MG PO TABS
2.5000 mg | ORAL_TABLET | Freq: Once | ORAL | Status: AC
  Administered 2015-09-20: 2.5 mg via ORAL
  Filled 2015-09-20: qty 1

## 2015-09-20 MED ORDER — MUPIROCIN CALCIUM 2 % EX CREA
TOPICAL_CREAM | Freq: Three times a day (TID) | CUTANEOUS | Status: DC
  Administered 2015-09-20: 1 via TOPICAL
  Administered 2015-09-20 – 2015-09-22 (×5): via TOPICAL
  Filled 2015-09-20: qty 15

## 2015-09-20 MED ORDER — KETOCONAZOLE 2 % EX CREA
TOPICAL_CREAM | Freq: Two times a day (BID) | CUTANEOUS | Status: DC
  Administered 2015-09-20 (×2): 1 via TOPICAL
  Administered 2015-09-21 – 2015-09-22 (×3): via TOPICAL
  Filled 2015-09-20: qty 15

## 2015-09-20 MED ORDER — LEVOTHYROXINE SODIUM 25 MCG PO TABS
125.0000 ug | ORAL_TABLET | Freq: Every day | ORAL | Status: DC
  Administered 2015-09-20 – 2015-09-22 (×3): 125 ug via ORAL
  Filled 2015-09-20 (×6): qty 1

## 2015-09-20 MED ORDER — MINOCYCLINE HCL 100 MG PO CAPS
100.0000 mg | ORAL_CAPSULE | Freq: Two times a day (BID) | ORAL | Status: DC
  Administered 2015-09-20 – 2015-09-22 (×6): 100 mg via ORAL
  Filled 2015-09-20 (×7): qty 1

## 2015-09-20 MED ORDER — LEVALBUTEROL HCL 0.63 MG/3ML IN NEBU
0.6300 mg | INHALATION_SOLUTION | Freq: Four times a day (QID) | RESPIRATORY_TRACT | Status: DC | PRN
  Administered 2015-09-20: 0.63 mg via RESPIRATORY_TRACT
  Filled 2015-09-20 (×2): qty 3

## 2015-09-20 MED ORDER — MUPIROCIN 2 % EX OINT
1.0000 "application " | TOPICAL_OINTMENT | Freq: Two times a day (BID) | CUTANEOUS | Status: DC
  Administered 2015-09-20 – 2015-09-22 (×5): 1 via NASAL
  Filled 2015-09-20 (×3): qty 22

## 2015-09-20 MED ORDER — METOPROLOL TARTRATE 25 MG PO TABS
25.0000 mg | ORAL_TABLET | Freq: Two times a day (BID) | ORAL | Status: DC
  Administered 2015-09-20 (×3): 25 mg via ORAL
  Filled 2015-09-20 (×3): qty 1

## 2015-09-20 MED ORDER — NYSTATIN 100000 UNIT/GM EX POWD
Freq: Three times a day (TID) | CUTANEOUS | Status: DC
  Administered 2015-09-20: 10:00:00 via TOPICAL
  Filled 2015-09-20: qty 15

## 2015-09-20 MED ORDER — DILTIAZEM HCL ER COATED BEADS 120 MG PO CP24
120.0000 mg | ORAL_CAPSULE | Freq: Two times a day (BID) | ORAL | Status: DC
  Administered 2015-09-20 – 2015-09-21 (×3): 120 mg via ORAL
  Filled 2015-09-20 (×3): qty 1

## 2015-09-20 MED ORDER — CETYLPYRIDINIUM CHLORIDE 0.05 % MT LIQD
7.0000 mL | Freq: Two times a day (BID) | OROMUCOSAL | Status: DC
  Administered 2015-09-20 – 2015-09-22 (×4): 7 mL via OROMUCOSAL

## 2015-09-20 MED ORDER — DILTIAZEM HCL 100 MG IV SOLR
5.0000 mg/h | INTRAVENOUS | Status: DC
  Filled 2015-09-20: qty 100

## 2015-09-20 MED ORDER — ATORVASTATIN CALCIUM 20 MG PO TABS
20.0000 mg | ORAL_TABLET | Freq: Every day | ORAL | Status: DC
  Administered 2015-09-20 – 2015-09-21 (×2): 20 mg via ORAL
  Filled 2015-09-20 (×2): qty 1

## 2015-09-20 MED ORDER — METOLAZONE 2.5 MG PO TABS: 2.5000 mg | ORAL_TABLET | ORAL | Status: DC

## 2015-09-20 MED ORDER — VILAZODONE HCL 20 MG PO TABS
20.0000 mg | ORAL_TABLET | Freq: Every day | ORAL | Status: DC
  Administered 2015-09-20 – 2015-09-22 (×3): 20 mg via ORAL
  Filled 2015-09-20 (×3): qty 1

## 2015-09-20 MED ORDER — ACETAMINOPHEN 325 MG PO TABS: 650.0000 mg | ORAL_TABLET | Freq: Three times a day (TID) | ORAL | Status: DC | PRN

## 2015-09-20 MED ORDER — CALCITONIN (SALMON) 200 UNIT/ACT NA SOLN
1.0000 | Freq: Every day | NASAL | Status: DC
  Administered 2015-09-20 – 2015-09-22 (×2): 1 via NASAL
  Filled 2015-09-20: qty 3.7

## 2015-09-20 MED ORDER — HYDROCODONE-ACETAMINOPHEN 5-325 MG PO TABS
1.0000 | ORAL_TABLET | ORAL | Status: DC | PRN
  Administered 2015-09-20: 1 via ORAL

## 2015-09-20 NOTE — Progress Notes (Signed)
Utilization Review Completed.  

## 2015-09-20 NOTE — Progress Notes (Signed)
Pt admitted to 2C08, pt is agitated and confused stating that she can not breath. MD paged to make aware of constant pain and moaning and restlessness, on 4L 02 sats 100%. Will place pt on Bipap when she goes to sleep.

## 2015-09-20 NOTE — Progress Notes (Signed)
CRITICAL VALUE ALERT  Critical value received: pt 51.1 INR 5.9  Date of notification:  09/20/2015   Time of notification:  4:58 AM   Critical value read back:Yes.    Nurse who received alert:  Cresenciano LickMikaela Emmogene Simson, RN   MD notified (1st page):  Maryfrances Bunnellanford, MD  Time of first page:  4:59 AM   MD notified (2nd page):  Time of second page:  Responding MD:  Maryfrances Bunnellanford, MD  Time MD responded:  4:59 AM

## 2015-09-20 NOTE — Progress Notes (Addendum)
ANTICOAGULATION CONSULT NOTE Pharmacy Consult for Warfarin  Indication: atrial fibrillation  Allergies  Allergen Reactions  . Ambien [Zolpidem Tartrate]     unknown  . Sulfa Antibiotics Nausea And Vomiting  . Clindamycin/Lincomycin Swelling and Rash    Patient Measurements: Height: 5\' 4"  (162.6 cm) Weight: 160 lb (72.576 kg) IBW/kg (Calculated) : 54.7  Vital Signs: Temp: 98.7 F (37.1 C) (10/26 0826) Temp Source: Oral (10/26 0826) BP: 103/70 mmHg (10/26 0955) Pulse Rate: 107 (10/26 0955)  Labs:  Recent Labs  09/19/15 1634 09/20/15 0308  HGB 8.9* 8.1*  HCT 28.5* 25.9*  PLT 148* 140*  LABPROT 45.6* 51.1*  INR 5.20* 5.94*  CREATININE 1.30* 1.10*  TROPONINI 0.03  --     Estimated Creatinine Clearance: 39.9 mL/min (by C-G formula based on Cr of 1.1).   Assessment: 81 yo F  from NH with confusion after fall last week, CT head negative, Hgb 8.1, INR is SUPRA-therapeutic at 5.94, pltc low at 140.  No new bleeding reported.   Goal of Therapy:  INR 2-3 Monitor platelets by anticoagulation protocol: Yes   Plan:  -Hold warfarin again today -Daily PT/INR, re-start warfarin as INR allows - MD ordered vit K 2.5 mg po x 1 dose today  Herby AbrahamMichelle T. Marquett Bertoli, Pharm.D. [phone] 09/20/2015 11:22 AM

## 2015-09-20 NOTE — H&P (Signed)
History and Physical  Patient Name: Sierra Singleton     XBJ:[id number]    DOB: 07-Apr-1933    DOA: 09/19/2015 Referring physician: Jonette Eva, MD PCP: Ignatius Specking., MD      Chief Complaint: Hypotension  HPI: Sierra Singleton is a 81 y.o. female with a past medical history significant for COPD on home O2, chronic diastolic CHF, chronic anemia, A. fib on warfarin, CKD stage III, hypothyroidism, and lung nodule with upcoming PET who presents with hypotension and brief altered mental status from the nursing home.  History is collected from the son and from EDP, as the patient is a poor historian. Evidently staff of the patient's nursing home noticed today that she was "talking out of her head". Her vitals were taken and it was noted that she was hypotensive to the 70s over 40s, and EMS was called. EMS found the patient to be in afib with RVR but normotensive and saturating well on her home O2.    In the ED, the patient was given fluid boluses for hypotension and IV diltiazem injections for afib.  She was oriented and her son felt that she was at her cognitive baseline.  After fluid administration the patient became dyspneic and was placed on BiPAP and given Lasix. TRH was asked to admit for A. fib with RVR and acute diastolic CHF exacerbation.       Review of Systems:  Pt complains of arm pain, but pain, leg pain. She has dyspnea, resolving.   She notes no confusion, syncope, chest discomfort, leg swelling. All other systems negative except as just noted or noted in the history of present illness.  Allergies  Allergen Reactions  . Ambien [Zolpidem Tartrate]     unknown  . Sulfa Antibiotics Nausea And Vomiting  . Clindamycin/Lincomycin Swelling and Rash    Prior to Admission medications   Medication Sig Start Date End Date Taking? Authorizing Provider  acetaminophen (TYLENOL) 650 MG CR tablet Take 650 mg by mouth every 8 (eight) hours as needed for pain.   Yes Historical Provider, MD    atorvastatin (LIPITOR) 20 MG tablet Take 20 mg by mouth daily at 6 PM.    Yes Historical Provider, MD  calcitonin, salmon, (MIACALCIN/FORTICAL) 200 UNIT/ACT nasal spray Place 1 spray into alternate nostrils daily.   Yes Historical Provider, MD  cloNIDine (CATAPRES) 0.1 MG tablet Take 0.1 mg by mouth 2 (two) times daily as needed (use if SBP > 200).   Yes Historical Provider, MD  cyanocobalamin 500 MCG tablet Place 500 mcg under the tongue daily.   Yes Historical Provider, MD  diltiazem (CARDIZEM CD) 120 MG 24 hr capsule Take 120 mg by mouth 2 (two) times daily.   Yes Historical Provider, MD  divalproex (DEPAKOTE SPRINKLE) 125 MG capsule Take 125 mg by mouth 2 (two) times daily.   Yes Historical Provider, MD  fentaNYL (DURAGESIC - DOSED MCG/HR) 12 MCG/HR Place 12.5 mcg onto the skin every 3 (three) days.   Yes Historical Provider, MD  ferrous sulfate 325 (65 FE) MG tablet Take 325 mg by mouth daily.   Yes Historical Provider, MD  folic acid (FOLVITE) 1 MG tablet Take 1 mg by mouth daily.   Yes Historical Provider, MD  furosemide (LASIX) 40 MG tablet Take 40 mg by mouth daily.   Yes Historical Provider, MD  HYDROcodone-acetaminophen (NORCO/VICODIN) 5-325 MG per tablet Take 1 tablet by mouth 3 (three) times daily.    Yes Historical Provider, MD  HYDROcodone-acetaminophen (NORCO/VICODIN)  5-325 MG tablet Take 1 tablet by mouth every 4 (four) hours as needed for moderate pain.   Yes Historical Provider, MD  hydrOXYzine (ATARAX/VISTARIL) 25 MG tablet Take 25 mg by mouth at bedtime.    Yes Historical Provider, MD  hydrOXYzine (ATARAX/VISTARIL) 25 MG tablet Take 25 mg by mouth 3 (three) times daily as needed for itching.   Yes Historical Provider, MD  levalbuterol Pauline Aus) 0.63 MG/3ML nebulizer solution Take 3 mLs (0.63 mg total) by nebulization every 6 (six) hours as needed for wheezing or shortness of breath. 02/17/15  Yes Erick Blinks, MD  levothyroxine (SYNTHROID, LEVOTHROID) 125 MCG tablet Take 125  mcg by mouth daily before breakfast.   Yes Historical Provider, MD  loratadine (CLARITIN) 10 MG tablet Take 10 mg by mouth daily.   Yes Historical Provider, MD  LORazepam (ATIVAN) 1 MG tablet Take 1 mg by mouth every 12 (twelve) hours as needed for anxiety.   Yes Historical Provider, MD  methotrexate 2.5 MG tablet Take 10 mg by mouth once a week. On Sunday.   Yes Historical Provider, MD  metolazone (ZAROXOLYN) 2.5 MG tablet Take 2.5 mg by mouth every Monday.    Yes Historical Provider, MD  metoprolol (LOPRESSOR) 25 MG tablet Take 1 tablet (25 mg total) by mouth 2 (two) times daily. 03/25/15  Yes Penny Pia, MD  minocycline (MINOCIN,DYNACIN) 100 MG capsule Take 100 mg by mouth 2 (two) times daily.   Yes Historical Provider, MD  OXYGEN Inhale 2 L into the lungs continuous.   Yes Historical Provider, MD  potassium chloride SA (K-DUR,KLOR-CON) 20 MEQ tablet Take 20 mEq by mouth 3 (three) times daily.   Yes Historical Provider, MD  traZODone (DESYREL) 100 MG tablet Take 100 mg by mouth at bedtime.   Yes Historical Provider, MD  Vilazodone HCl (VIIBRYD) 20 MG TABS Take 20 mg by mouth daily.   Yes Historical Provider, MD  Vitamin D, Ergocalciferol, (DRISDOL) 50000 UNITS CAPS capsule Take 50,000 Units by mouth every 7 (seven) days. Take on thursdays   Yes Historical Provider, MD  warfarin (COUMADIN) 2.5 MG tablet Take 2.5 mg by mouth every Tuesday, Thursday, Saturday, and Sunday at 6 PM.    Yes Historical Provider, MD  warfarin (COUMADIN) 5 MG tablet Take 5 mg by mouth every Monday, Wednesday, and Friday at 6 PM. Take 1 tablet every Monday, Wednesday, and Friday   Yes Historical Provider, MD    Past Medical History  Diagnosis Date  . Rash     [name] rash  . Anxiety   . Thyroid disease   . Hyperlipidemia   . Hypertension   . COPD (chronic obstructive pulmonary disease) (HCC)   . Poor historian   . Carotid artery disease (HCC)   . Atrial fibrillation (HCC) 02/11/2015  . Chronic respiratory  failure with hypoxia (HCC) 02/11/2015  . 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 Collagen vascular disease (HCC)   . CHF (congestive heart failure) [facility])     Past Surgical History  Procedure Laterality Date  . Back surgery      Family history: family history includes COPD in her sister; Cancer in her mother; Colon cancer in her brother; Emphysema in her sister; Other in her brother; Stroke in her father.    Social History: Patient lives at [facility] nursing home.  Independent with ADLs, but uses a wheelchair and does not walk.  Former smoker.       Physical Exam: BP 153/78 mmHg  Pulse 115  Temp(Src) 98.8  F (37.1 C) (Oral)  Resp 22  Ht  (1.626 m)  Wt 72.576 kg (160 lb)  BMI 27.45 kg/m2  SpO2 100% General appearance: Obese adult female, alert and in moderate distress from pain rather than dyspnea.   Eyes: Anicteric, conjunctiva pink, lids and lashes normal.     ENT: No nasal deformity, discharge, or epistaxis.  OP moist without lesions.   Skin: Warm and dry.  Extensive senile purpura. Cardiac: Tachycardic, irregular, nl S1-S2, SEM 1+.  Capillary refill is brisk.  JVP not visible.  Trace LE edema.  Radial and DP pulses 2+ and symmetric. Respiratory: Normal respiratory rate and rhythm.  Diffuse wheezes, no rales. Abdomen: Abdomen soft without rigidity.  Mild diffuse TTP. No ascites, distension.   MSK: No deformities or effusions. Neuro: Sensorium intact and responding to questions, attention normal.  Oriented to place, year, situation.  Does not remember details of earlier today.  Knows the election is coming up.  Speech is fluent.  Moves all extremities equally and with normal coordination.    Psych: Behavior appropriate.  Affect anxious and aggravated.  No evidence of aural or visual hallucinations or delusions.       Labs on Admission:  The metabolic panel shows normal sodium, potassium, elevated bicarbonate, serum creatinine 1.3 mg/dL from a baseline of 4.09 mg/dL. The abdomen  was 1.9 g/dL. Transaminases and bilirubin are normal. The complete blood count shows chronic normocytic anemia, this was noted to be an iron deficiency anemia during the patient's hospitalization in May. Supratherapeutic INR. Lactic acid level normal. Troponin normal. Urinalysis shows hyaline casts. Blood cultures are pending     Radiological Exams on Admission: Personally reviewed: Dg Chest Port 1 View 09/19/2015  Nodule in RLL, no other opacities. On repeat, there is interval increase in pulmonary congestion/edema.    Dg Pelvis 1-2 Views 09/19/2015  IMPRESSION: Healing displaced fractures of the right superior and inferior pubic rami with residual deformity. No acute injuries identified.    Ct Head Wo Contrast 09/19/2015   IMPRESSION: No acute intracranial abnormality. Stable atrophy.  Evidence for chronic small vessel ischemic changes.      EKG: Independently reviewed. Afib, rate 108.    Assessment/Plan  1. Atrial fibrillation with RVR:  The patient's elevated rate is new.  She has CHADS2Vasc 5.   -Admit to Step down -Diltiazem gtt and hold home dilt -Warfarin per pharmacy -Repeat TSH -Optimize fluid balance  2. Fluid overload, acute diastolic CHF:  This is new.  Last EF in March was 60-65%.  The patient was given boluses of fluids on arrival to the ER, and then required Bipap and furosemide. -Continue Bipap overnight as needed -Continue home oral furosemide and metolazone  3. COPD on home O2:  From her elevated bicarbonate, patient appears to be chronic retainer. -Continue home inhalers  4. CKD III:  Stable.  5. Supratherapeutic INR:  This is new.  -Warfarin dosing per pharmacy -Trend INR  6. Depression and chronic pain:  Stable.  -Cotninue home vilazodone, hydrocodone, fentanyl, hydrocodone, and divalproex     DVT PPx: Warfarin Diet: Regular Code Status: DNR Family Communication: The patient's diagnosis, workup and plan were discussed with  her son at the bedside.  Medical decision making: What exists of the patient's previous chart was reviewed in depth and the case was discussed with Dr. Sibyl Parr. Patient seen 10:36 PM on 09/19/2015.  Disposition Plan:  Admit to stepdown.  Bipap overnight.  Diuresis and rate control.  Hopefully the patient will  be able to transfer out of SDU tomorrow and be ready for discharge in 2-3 days.      Alberteen SamChristopher P Hutton [place] [facility] Pager [phone]

## 2015-09-20 NOTE — Consult Note (Addendum)
WOC consult requested for "skin rash." [facility]. 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 It was much more severe during the previous admission in May, resulting in full thickness skin loss to many areas. Posterior back and upper buttocks have pink dry scar tissue where skin has healed.  Folds under abd near groin and to bilat skin folds below breasts have patchy areas of red moist skin loss, which is partial thickness.  Appearance consistent with intertrigo.  Small amt yellow drainage with strong odor, several fissures with red woundbeds to skin fold areas. Plan: Pt has used Interdry silver impregnated fabric for breast and abd/groin skin folds in the past; will resume this plan of care. This will wick moisture away from skin and provide antimicrobial benefits. It should be left in place 5 days for optimal plan of care. Discussed plan of care with patient at bedside. No family members present to discuss topical treatment. Left heel with stage 1 pressure injury, 5X5cm with intact skin, no drainage.  Float heels to reduce pressure. Lower buttocks with 4X4cm dark purple bruised deep tissue injury with intact skin, no  Drainage; foam dressing to protect from further injury. Please refer to previous med list and order topical cream if desired; pt is unable to state the name of the prescription cream she has used in the past. Pt has Haley-Haley disease and this complex medical condition is beyond [facility] scope [facility] practice.  Please re-consult if further assistance is needed. Thank-you,  Cammie Mcgeeawn Parthenia Tellefsen MSN, RN, CWOCN, [place]-AP, CNS [phone]

## 2015-09-20 NOTE — Progress Notes (Signed)
ANTICOAGULATION CONSULT NOTE - Initial Consult  Pharmacy Consult for Warfarin  Indication: atrial fibrillation  Allergies  Allergen Reactions  . Ambien [Zolpidem Tartrate]     unknown  . Sulfa Antibiotics Nausea And Vomiting  . Clindamycin/Lincomycin Swelling and Rash    Patient Measurements: Height: 5\' 4"  (162.6 cm) Weight: 160 lb (72.576 kg) IBW/kg (Calculated) : 54.7  Vital Signs: Temp: 98.8 F (37.1 C) (10/26 0033) Temp Source: Oral (10/26 0033) BP: 153/78 mmHg (10/26 0033) Pulse Rate: 115 (10/26 0033)  Labs:  Recent Labs  09/19/15 1634  HGB 8.9*  HCT 28.5*  PLT 148*  LABPROT 45.6*  INR 5.20*  CREATININE 1.30*  TROPONINI 0.03    Estimated Creatinine Clearance: 33.7 mL/min (by C-G formula based on Cr of 1.3).   Medical History: Past Medical History  Diagnosis Date  . Rash     Sierra Singleton Sierra Singleton rash  . Anxiety   . Thyroid disease   . Hyperlipidemia   . Hypertension   . COPD (chronic obstructive pulmonary disease) (HCC)   . Poor historian   . Carotid artery disease (HCC)   . Atrial fibrillation (HCC) 02/11/2015  . Chronic respiratory failure with hypoxia (HCC) 02/11/2015  . 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 Collagen vascular disease (HCC)   . CHF (congestive heart failure) (HCC)     Assessment: Here from NH with confusion after fall last week, CT head negative, Hgb 8.9, INR is SUPRA-therapeutic at 5.20, other meds/labs reviewed.   Goal of Therapy:  INR 2-3 Monitor platelets by anticoagulation protocol: Yes   Plan:  -Hold warfarin  -Daily PT/INR, re-start warfarin as INR allows  Sierra Singleton, Sierra Singleton 09/20/2015,12:59 AM

## 2015-09-20 NOTE — Progress Notes (Signed)
Patient Demographics  Sierra Singleton, is a 81 y.o. female, DOB - 1933-03-12, ZOX:[id number]  Admit date - 09/19/2015   Admitting Physician Alberteen Sam, MD  Outpatient Primary MD for the patient is VYAS,DHRUV B., MD  LOS - 1   Chief Complaint  Patient presents with  . Hypotension       Admission HPI/Brief narrative:  Subjective:   Sierra Singleton today has, No headache, No chest pain, No abdominal pain - No Nausea, No new weakness tingling or numbness, No Cough - SOB.   Assessment & Plan    Principal Problem:   Atrial fibrillation with RVR (HCC) Active Problems:   COPD (chronic obstructive pulmonary disease) (HCC)   Hypothyroidism   Atrial fibrillation (HCC)   Chronic diastolic CHF (congestive heart failure) (HCC)   Protein-calorie malnutrition (HCC)   CKD (chronic kidney disease)   Acute on chronic respiratory failure (HCC)  Atrial fibrillation with RVR:  - Rate uncontrolled on presentation,  on metoprolol and Cardizem at home, currently on Cardizem drip, back on oral dose metoprolol, we'll resume on home dose Cardizem, so we can stop Cardizem drip. -  She has CHADS2Vasc 5, on warfarin with supratherapeutic INR.   Fluid overload, acute diastolic CHF:  - Last EF in March was 60-65%. The patient was given boluses of fluids on arrival to the ER, developed volume overload and then required Bipap and furosemide. - Required BiPAP overnight, currently off BiPAP - Resume home dose metolazone and Lasix  Acute on chronic hypoxic respiratory failure - Agent is on 2 L nasal cannula at baseline, required BiPAP overnight for volume overload, improved after diuresis, back to baseline.   COPD on home O2:  - No active wheezing, continue with nebs as needed  CKD III:  Stable.  Supratherapeutic INR:  - INR increased to 5.9 today, will give 2.5 mg of by mouth vitamin K.  Depression  and chronic pain:  -Cotninue home vilazodone, hydrocodone, fentanyl, hydrocodone, and divalproex  Lung nodule - Workup as an outpatient, has upcoming PET scans  [name] - Wound consult appreciated. - Already on minocycline and methotrexate as an outpatient(currently methotrexate is on hold), will start on miconazole and Bactroban cream .  Pressure ulcers - Wound care consult appreciated  Code Status: DO NOT RESUSCITATE  Family Communication: None at bedside  Disposition Plan: Back to SNF once stable   Procedures  None   Consults   None   Medications  Scheduled Meds: . antiseptic oral rinse  7 mL Mouth Rinse BID  . atorvastatin  20 mg Oral q1800  . calcitonin (salmon)  1 spray Alternating Nares Daily  . Chlorhexidine Gluconate Cloth  6 each Topical Q0600  . diltiazem  120 mg Oral BID  . divalproex  125 mg Oral BID  . [START ON 09/23/2015] fentaNYL  12.5 mcg Transdermal Q72H  . furosemide  40 mg Oral Daily  . HYDROcodone-acetaminophen  1 tablet Oral TID  . ketoconazole   Topical BID  . levothyroxine  125 mcg Oral QAC breakfast  . [START ON 09/25/2015] metolazone  2.5 mg Oral Q Mon  . metoprolol  25 mg Oral BID  . minocycline  100 mg Oral BID  . mupirocin cream   Topical TID  .  mupirocin ointment  1 application Nasal BID  . phytonadione  2.5 mg Oral Once  . sodium chloride  3 mL Intravenous Q12H  . Vilazodone HCl  20 mg Oral Daily   Continuous Infusions: . diltiazem (CARDIZEM) infusion 12.5 mg/hr (09/20/15 0056)   PRN Meds:.acetaminophen, HYDROcodone-acetaminophen, levalbuterol  DVT Prophylaxis  on warfarin  Lab Results  Component Value Date   PLT 140* 09/20/2015    Antibiotics    Anti-infectives    Start     Dose/Rate Route Frequency Ordered Stop   09/20/15 1000  fluconazole (DIFLUCAN) tablet 150 mg  Status:  Discontinued     150 mg Oral Daily 09/20/15 0823 09/20/15 1058   09/20/15 0100  minocycline (MINOCIN,DYNACIN) capsule 100 mg       100 mg Oral 2 times daily 09/20/15 0055            Objective:   Filed Vitals:   09/20/15 0158 09/20/15 0416 09/20/15 0826 09/20/15 0955  BP:  94/36 103/70 103/70  Pulse: 87 88  107  Temp:  98.8 F (37.1 C) 98.7 F (37.1 C)   TempSrc:  Axillary Oral   Resp: 19 18    Height:      Weight:      SpO2: 100% 98%      Wt Readings from Last 3 Encounters:  09/19/15 72.576 kg (160 lb)  09/11/15 72.576 kg (160 lb)  07/04/15 76.204 kg (168 lb)     Intake/Output Summary (Last 24 hours) at 09/20/15 1102 Last data filed at 09/20/15 0827  Gross per 24 hour  Intake   2500 ml  Output   1700 ml  Net    800 ml     Physical Exam  Awake Alert,  [place].AT,[name] Supple Neck,No JVD,  Symmetrical Chest wall movement, Good air movement bilaterally,  Irregular, tachycardic,No Gallops,Rubs or new Murmurs, No Parasternal Heave +ve B.Sounds, Abd Soft, No tenderness, No organomegaly appriciated,  No Cyanosis, Clubbing or edema, patient has areas of moist skin loss below breast and groin area secondary to Hailey Hailey disease.   Data Review   Micro Results Recent Results (from the past 240 hour(s))  Urine culture     Status: None (Preliminary result)   Collection Time: 09/19/15  5:30 PM  Result Value Ref Range Status   Specimen Description URINE, CATHETERIZED  Final   Special Requests NONE  Final   Culture TOO YOUNG TO READ  Final   Report Status PENDING  Incomplete  MRSA PCR Screening     Status: Abnormal   Collection Time: 09/20/15  1:42 AM  Result Value Ref Range Status   MRSA by PCR POSITIVE (A) NEGATIVE Final    Comment:        The GeneXpert MRSA Assay (FDA approved for NASAL specimens only), is one component of a comprehensive MRSA colonization surveillance program. It is not intended to diagnose MRSA infection nor to guide or monitor treatment for MRSA infections. RESULT CALLED TO, READ BACK BY AND VERIFIED WITH: M INGRAM@0415  09/20/15 [facility]     Radiology  Reports Dg Pelvis 1-2 Views  09/19/2015  CLINICAL DATA:  Fall with injury.  Initial encounter. EXAM: PELVIS - 1-2 VIEW COMPARISON:  CT of the pelvis on 04/08/2015 and 03/23/2015 FINDINGS: Displaced right-sided superior and inferior pubic rami fractures show partial healing and residual deformity. These were more acute in appearance by CT in April and May. No acute fracture or diastasis identified. Hip joints appear intact with mild osteoarthritis present bilaterally. IMPRESSION:  Healing displaced fractures of the right superior and inferior pubic rami with residual deformity. No acute injuries identified. Electronically Signed   By: Irish LackGlenn  Yamagata M.D.   On: 09/19/2015 16:44   Ct Head Wo Contrast  09/19/2015  CLINICAL DATA:  81 year old with a fall last week. Knot on the back of the head. Altered mental status. EXAM: CT HEAD WITHOUT CONTRAST TECHNIQUE: Contiguous axial images were obtained from the base of the skull through the vertex without contrast. COMPARISON:  03/22/2015 FINDINGS: Stable cerebral and cerebellar atrophy. Stable low-density in the periventricular white matter. No evidence for acute hemorrhage, mass lesion, midline shift, hydrocephalus or large infarct. Mild mucosal thickening in the ethmoid air cells. Otherwise, the paranasal sinuses are clear. No calvarial fracture. Evidence for a small scalp contusion along the right posterior scalp. IMPRESSION: No acute intracranial abnormality. Stable atrophy.  Evidence for chronic small vessel ischemic changes. Electronically Signed   By: Richarda OverlieAdam  Henn M.D.   On: 09/19/2015 16:43   Dg Chest Port 1 View  09/19/2015  CLINICAL DATA:  Shortness of breath, confusion.  Lung nodule. EXAM: PORTABLE CHEST - 1 VIEW COMPARISON:  Earlier film of the same day, and previous FINDINGS: Increase in bilateral interstitial edema or infiltrates. Right lower lobe nodule as described previously. Heart size upper limits normal for technique. Atheromatous aorta. No  pneumothorax. No definite effusion. Visualized skeletal structures are unremarkable. IMPRESSION: 1. Increase in bilateral interstitial edema or infiltrates since earlier film. Electronically Signed   By: Corlis Leak  Hassell M.D.   On: 09/19/2015 21:11   Dg Chest Port 1 View  09/19/2015  CLINICAL DATA:  Altered mental status.  Unwitnessed fall 1 week ago. EXAM: PORTABLE CHEST 1 VIEW COMPARISON:  02/18/2015 FINDINGS: COPD with chronic hyperinflation and apical emphysematous change. Indistinct opacity at the right base is noted, presumably persistence of the nodular lesion described 06/26/2015. Suspect small left pleural effusion. Borderline cardiomegaly for technique. Negative aortic and hilar contours. Remote proximal right humerus fracture. No acute osseous finding. IMPRESSION: 1. Probable trace left pleural effusion.  No pneumonia or edema. 2. Density at the right base correlating with suspicious nodule on chest CT 06/26/2015. Reference CT report. 3. COPD. Electronically Signed   By: Marnee SpringJonathon  Watts M.D.   On: 09/19/2015 16:45     CBC  Recent Labs Lab 09/19/15 1634 09/20/15 0308  WBC 4.1 3.9*  HGB 8.9* 8.1*  HCT 28.5* 25.9*  PLT 148* 140*  MCV 97.9 97.7  MCH 30.6 30.6  MCHC 31.2 31.3  RDW 16.7* 16.5*    Chemistries   Recent Labs Lab 09/19/15 1634 09/20/15 0308  NA 143 141  K 4.4 3.8  CL 99* 97*  CO2 37* 30  GLUCOSE 95 101*  BUN 35* 25*  CREATININE 1.30* 1.10*  CALCIUM 9.0 8.5*  AST 20  --   ALT 10*  --   ALKPHOS 67  --   BILITOT 0.5  --    ------------------------------------------------------------------------------------------------------------------ estimated creatinine clearance is 39.9 mL/min (by C-G formula based on Cr of 1.1). ------------------------------------------------------------------------------------------------------------------ No results for input(s): HGBA1C in the last 72  hours. ------------------------------------------------------------------------------------------------------------------ No results for input(s): CHOL, HDL, LDLCALC, TRIG, CHOLHDL, LDLDIRECT in the last 72 hours. ------------------------------------------------------------------------------------------------------------------  Recent Labs  09/20/15 0555  TSH 0.567   ------------------------------------------------------------------------------------------------------------------ No results for input(s): VITAMINB12, FOLATE, FERRITIN, TIBC, IRON, RETICCTPCT in the last 72 hours.  Coagulation profile  Recent Labs Lab 09/19/15 1634 09/20/15 0308  INR 5.20* 5.94*    No results for input(s): DDIMER in the  last 72 hours.  Cardiac Enzymes  Recent Labs Lab 09/19/15 1634  TROPONINI 0.03   ------------------------------------------------------------------------------------------------------------------ Invalid input(s): POCBNP     Time Spent in minutes   40 minutes   Sarabella Caprio M.D on 09/20/2015 at 11:02 AM  Between 7am to 7pm - Pager - [phone]  After 7pm go to [email or web address] - password [facility]  [facility]   Office  [phone]

## 2015-09-20 NOTE — Clinical Social Work Note (Signed)
Clinical Social Work Assessment  Patient Details  Name: Sierra Singleton MRN: [id number] Date of Birth: 1933-09-20  Date of referral:  09/20/15               Reason for consult:  Facility Placement                Permission sought to share information with:  Facility Industrial/product designerContact Representative Permission granted to share information::  No  Name::     Lucita FerraraBarry Glossom  Agency::  [facility]  Relationship::  son  Contact Information:     Housing/Transportation Living arrangements for the past 2 months:  Skilled Building surveyorursing Facility Source of Information:  Adult Children Patient Interpreter Needed:  None Criminal Activity/Legal Involvement Pertinent to Current Situation/Hospitalization:  No - Comment as needed Significant Relationships:  Adult Children Lives with:  Facility Resident Do you feel safe going back to the place where you live?  Yes Need for family participation in patient care:  Yes (Comment) (decision making)  Care giving concerns:  None- LTC patient   Office managerocial Worker assessment / plan:  CSW spoke with pt son about plan for pt at time of DC  Employment status:  Retired Database administratornsurance information:  Managed Medicare PT Recommendations:  Not assessed at this time Information / Referral to community resources:  Skilled Nursing Facility  Patient/Family's Response to care:  Pt son plans for pt to return to [facility] where she has been resident for 2.5 months  Patient/Family's Understanding of and Emotional Response to Diagnosis, Current Treatment, and Prognosis:  No questions or concerns  Emotional Assessment Appearance:  Appears stated age Attitude/Demeanor/Rapport:  Unable to Assess Affect (typically observed):  Unable to Assess Orientation:  Oriented to Self, Oriented to Place Alcohol / Substance use:  Not Applicable Psych involvement (Current and /or in the community):  No (Comment)  Discharge Needs  Concerns to be addressed:  Care Coordination Readmission within the last 30 days:   No Current discharge risk:  Physical Impairment Barriers to Discharge:  Continued Medical Work up   Peabody EnergyHoloman, Kaiyana Bedore M, LCSW 09/20/2015, 3:26 PM

## 2015-09-21 DIAGNOSIS — L8990 Pressure ulcer of unspecified site, unspecified stage: Secondary | ICD-10-CM | POA: Insufficient documentation

## 2015-09-21 LAB — PROTIME-INR
INR: 2.79 — ABNORMAL HIGH (ref 0.00–1.49)
PROTHROMBIN TIME: 29 s — AB (ref 11.6–15.2)

## 2015-09-21 LAB — CBC
HCT: 30 % — ABNORMAL LOW (ref 36.0–46.0)
HEMOGLOBIN: 9.1 g/dL — AB (ref 12.0–15.0)
MCH: 30.1 pg (ref 26.0–34.0)
MCHC: 30.3 g/dL (ref 30.0–36.0)
MCV: 99.3 fL (ref 78.0–100.0)
PLATELETS: 164 10*3/uL (ref 150–400)
RBC: 3.02 MIL/uL — ABNORMAL LOW (ref 3.87–5.11)
RDW: 16.5 % — AB (ref 11.5–15.5)
WBC: 5 10*3/uL (ref 4.0–10.5)

## 2015-09-21 LAB — BASIC METABOLIC PANEL
Anion gap: 11 (ref 5–15)
BUN: 24 mg/dL — AB (ref 6–20)
CALCIUM: 8.6 mg/dL — AB (ref 8.9–10.3)
CO2: 34 mmol/L — ABNORMAL HIGH (ref 22–32)
CREATININE: 1.12 mg/dL — AB (ref 0.44–1.00)
Chloride: 96 mmol/L — ABNORMAL LOW (ref 101–111)
GFR calc Af Amer: 52 mL/min — ABNORMAL LOW (ref >60)
GFR, EST NON AFRICAN AMERICAN: 45 mL/min — AB (ref >60)
GLUCOSE: 96 mg/dL (ref 65–99)
Potassium: 3.4 mmol/L — ABNORMAL LOW (ref 3.5–5.1)
SODIUM: 141 mmol/L (ref 135–145)

## 2015-09-21 LAB — MAGNESIUM: MAGNESIUM: 1.2 mg/dL — AB (ref 1.7–2.4)

## 2015-09-21 MED ORDER — WARFARIN SODIUM 5 MG PO TABS
2.5000 mg | ORAL_TABLET | Freq: Once | ORAL | Status: AC
  Administered 2015-09-21: 2.5 mg via ORAL
  Filled 2015-09-21: qty 1

## 2015-09-21 MED ORDER — METOPROLOL TARTRATE 50 MG PO TABS
50.0000 mg | ORAL_TABLET | Freq: Two times a day (BID) | ORAL | Status: DC
  Administered 2015-09-21: 50 mg via ORAL
  Filled 2015-09-21 (×2): qty 1

## 2015-09-21 MED ORDER — POTASSIUM CHLORIDE CRYS ER 20 MEQ PO TBCR
30.0000 meq | EXTENDED_RELEASE_TABLET | ORAL | Status: AC
Start: 2015-09-21 — End: 2015-09-21
  Administered 2015-09-21: 30 meq via ORAL
  Filled 2015-09-21 (×2): qty 2

## 2015-09-21 MED ORDER — MAGNESIUM SULFATE 2 GM/50ML IV SOLN
2.0000 g | Freq: Once | INTRAVENOUS | Status: AC
  Administered 2015-09-21: 2 g via INTRAVENOUS
  Filled 2015-09-21: qty 50

## 2015-09-21 MED ORDER — DILTIAZEM HCL ER COATED BEADS 180 MG PO CP24
180.0000 mg | ORAL_CAPSULE | Freq: Two times a day (BID) | ORAL | Status: DC
  Administered 2015-09-21 – 2015-09-22 (×2): 180 mg via ORAL
  Filled 2015-09-21 (×2): qty 1

## 2015-09-21 MED ORDER — HALOPERIDOL LACTATE 5 MG/ML IJ SOLN
INTRAMUSCULAR | Status: AC
  Filled 2015-09-21: qty 1

## 2015-09-21 MED ORDER — DILTIAZEM HCL 25 MG/5ML IV SOLN
10.0000 mg | Freq: Once | INTRAVENOUS | Status: DC
  Filled 2015-09-21: qty 5

## 2015-09-21 MED ORDER — HALOPERIDOL LACTATE 5 MG/ML IJ SOLN
1.0000 mg | Freq: Once | INTRAMUSCULAR | Status: AC
  Administered 2015-09-21: 1 mg via INTRAMUSCULAR

## 2015-09-21 MED ORDER — ENSURE ENLIVE PO LIQD: 237.0000 mL | Freq: Two times a day (BID) | ORAL | Status: DC

## 2015-09-21 NOTE — Progress Notes (Signed)
Initial Nutrition Assessment  DOCUMENTATION CODES:   Not applicable  INTERVENTION:   Ensure Enlive po BID, each supplement provides 350 kcal and 20 grams of protein  NUTRITION DIAGNOSIS:   Increased nutrient needs related to wound healing as evidenced by estimated needs  GOAL:   Patient will meet greater than or equal to 90% of their needs  MONITOR:   PO intake, Supplement acceptance, Labs, Weight trends, Skin, I & O's  REASON FOR ASSESSMENT:   Low Braden  ASSESSMENT:   81 y.o. Female with with PMH significant for COPD on home O2, chronic diastolic CHF, chronic anemia, A. fib on warfarin, CKD stage III, hypothyroidism, and lung nodule with upcoming PET who presents with hypotension and brief altered mental status from the nursing home.   Pt confused and agitated.  RD unable to obtain nutrition hx or complete Nutrition Focused Physical Exam.    CWOCN note reviewed.  Nutrient needs increased given wound presence. Low braden score places patient at risk for further skin breakdown.    PO intake variable at 20-50% per flowsheet records.  Would benefit from addition of oral nutrition supplements.  RD to order.  Diet Order:  Diet regular Room service appropriate?: Yes; Fluid consistency:: Thin  Skin:  Wound (see comment) (DTI to buttocks, Stage I pressure injury to L heel)  Last BM:  10/27  Height:   Ht Readings from Last 1 Encounters:  09/20/15 5\' 4"  (1.626 m)    Weight:   Wt Readings from Last 1 Encounters:  09/19/15 160 lb (72.576 kg)    Ideal Body Weight:  54.5 kg  BMI:  Body mass index is 27.45 kg/(m^2).  Estimated Nutritional Needs:   Kcal:  1650-1850  Protein:  80-90 gm  Fluid:  1.6-1.8 L  EDUCATION NEEDS:   Education needs no appropriate at this time  Maureen ChattersKatie Dyanara Cozza, RD, LDN Pager #: [phone] After-Hours Pager #: [phone]

## 2015-09-21 NOTE — Progress Notes (Signed)
ANTICOAGULATION CONSULT NOTE Pharmacy Consult for Warfarin  Indication: atrial fibrillation Labs:  Recent Labs  09/19/15 1634 09/20/15 0308 09/21/15 0304  HGB 8.9* 8.1* 9.1*  HCT 28.5* 25.9* 30.0*  PLT 148* 140* 164  LABPROT 45.6* 51.1* 29.0*  INR 5.20* 5.94* 2.79*  CREATININE 1.30* 1.10* 1.12*  TROPONINI 0.03  --   --     Estimated Creatinine Clearance: 39.1 mL/min (by C-G formula based on Cr of 1.12).   Assessment: 81 yo F from NH with confusion after fall last week, CT head negative,  coum PTA for afib; home dose 5 mg MWF 2.5 mg TTSS. Last dose 5 mg 10/24 INR 2.79 after vitamin K 2.5 mg po given yesterday;  Hg 8.1>9.1, pltc 140>164 . Hematomas to back of head from fall at SNF last Wednesday; head CT neg. No new bleeding reported.  Goal of Therapy:  INR 2-3   Plan:  -MD gave vitamin K 2.5 mg PO 10/27 -Coumadin 2.5 mg po x  1 dose - Daily INR  Herby AbrahamMichelle T. Corinthian Mizrahi, Pharm.D. [phone] 09/21/2015 2:10 PM

## 2015-09-21 NOTE — Progress Notes (Signed)
PT Cancellation Note  Patient Details Name: Sierra KalesShelby J Singleton MRN: [id number] DOB: 1933/07/10   Cancelled Treatment:    Reason Eval/Treat Not Completed: Medical issues which prohibited therapy (Confused, agitated and HR up to 160 bpm.  Nurse wanted PT to HOLD.)  Will return tomorrow.  Thanks.   Tawni MillersWhite, Nechelle Petrizzo F 09/21/2015, 12:02 PM Entergy CorporationDawn Milbert Bixler,PT Acute Rehabilitation [phone] (pager)

## 2015-09-21 NOTE — Progress Notes (Addendum)
Patient Demographics  Sierra Singleton, is a 81 y.o. female, DOB - [birth year], ZOX:[id number]  Admit date - 09/19/2015   Admitting Physician Alberteen Sam, MD  Outpatient Primary MD for the patient is VYAS,DHRUV B., MD  LOS - 2   Chief Complaint  Patient presents with  . Hypotension         Subjective:   Sierra Singleton today has, No headache, No chest pain, No abdominal pain - No Nausea, No new weakness tingling or numbness, No Cough - SOB.   Assessment & Plan    Principal Problem:   Atrial fibrillation with RVR (HCC) Active Problems:   COPD (chronic obstructive pulmonary disease) (HCC)   Hypothyroidism   Atrial fibrillation (HCC)   Chronic diastolic CHF (congestive heart failure) (HCC)   Protein-calorie malnutrition (HCC)   CKD (chronic kidney disease)   Acute on chronic respiratory failure (HCC)   Pressure ulcer  Atrial fibrillation with RVR:  - Rate uncontrolled on presentation,  on metoprolol and Cardizem at home,cardizem drip stopped overnight, heart rate remains poorly controlled, increased po cardizem and metoprolol. -  She has CHADS2Vasc 5, on warfarin , dosed by pharmacy.   Fluid overload, acute diastolic CHF:  - Last EF in March was 60-65%. The patient was given boluses of fluids on arrival to the ER, developed volume overload and then required Bipap and furosemide. - Required BiPAP on admission, currently off BiPAP - on  home dose metolazone and Lasix  Acute on chronic hypoxic respiratory failure - patient  is on 2 L nasal cannula at baseline, required BiPAP overnight for volume overload, improved after diuresis, back to baseline.   COPD on home O2:  - No active wheezing, continue with nebs as needed  Acute delerium - required 1 dose of IM haloperidol.  CKD III:  Stable.  Supratherapeutic INR:  - improved with vitamin K.  Depression and chronic pain:   -Cotninue home vilazodone, hydrocodone, fentanyl, hydrocodone, and divalproex  Lung nodule - Workup as an outpatient, has upcoming PET scans  [name] - Wound consult appreciated. - Already on minocycline and methotrexate as an outpatient(currently methotrexate is on hold), will start on miconazole and Bactroban cream .  Pressure ulcers - Wound care consult appreciated  Hypokalemia, hypomagnesemia - repleted, recheck in am.  Code Status: DO NOT RESUSCITATE  Family Communication: None at bedside  Disposition Plan: Back to SNF once stable   Procedures  None   Consults   None   Medications  Scheduled Meds: . antiseptic oral rinse  7 mL Mouth Rinse BID  . atorvastatin  20 mg Oral q1800  . calcitonin (salmon)  1 spray Alternating Nares Daily  . Chlorhexidine Gluconate Cloth  6 each Topical Q0600  . diltiazem  180 mg Oral BID  . diltiazem  10 mg Intravenous Once  . divalproex  125 mg Oral BID  . feeding supplement (ENSURE ENLIVE)  237 mL Oral BID BM  . [START ON 09/23/2015] fentaNYL  12.5 mcg Transdermal Q72H  . furosemide  40 mg Oral Daily  . HYDROcodone-acetaminophen  1 tablet Oral TID  . ketoconazole   Topical BID  . levothyroxine  125 mcg Oral QAC breakfast  . [START ON 09/25/2015] metolazone  2.5 mg Oral  Q Mon  . metoprolol  50 mg Oral BID  . minocycline  100 mg Oral BID  . mupirocin cream   Topical TID  . mupirocin ointment  1 application Nasal BID  . sodium chloride  3 mL Intravenous Q12H  . Vilazodone HCl  20 mg Oral Daily  . warfarin  2.5 mg Oral ONCE-1800  . Warfarin - Pharmacist Dosing Inpatient   Does not apply q1800   Continuous Infusions: . diltiazem (CARDIZEM) infusion 12.5 mg/hr (09/20/15 0056)   PRN Meds:.acetaminophen, HYDROcodone-acetaminophen, levalbuterol  DVT Prophylaxis  on warfarin  Lab Results  Component Value Date   PLT 164 09/21/2015    Antibiotics    Anti-infectives    Start     Dose/Rate Route Frequency  Ordered Stop   09/20/15 1000  fluconazole (DIFLUCAN) tablet 150 mg  Status:  Discontinued     150 mg Oral Daily 09/20/15 0823 09/20/15 1058   09/20/15 0100  minocycline (MINOCIN,DYNACIN) capsule 100 mg     100 mg Oral 2 times daily 09/20/15 0055            Objective:   Filed Vitals:   09/21/15 0600 09/21/15 0815 09/21/15 1246 09/21/15 1603  BP: 139/79 161/84 169/72 135/66  Pulse: 96 109 111 120  Temp:  97.8 F (36.6 C) 99.2 F (37.3 C) 98.6 F (37 C)  TempSrc:  Oral Oral Oral  Resp: 22 23 26 18   Height:      Weight:      SpO2: 94% 100% 100% 100%    Wt Readings from Last 3 Encounters:  09/19/15 72.576 kg (160 lb)  09/11/15 72.576 kg (160 lb)  07/04/15 76.204 kg (168 lb)     Intake/Output Summary (Last 24 hours) at 09/21/15 1630 Last data filed at 09/21/15 1330  Gross per 24 hour  Intake    240 ml  Output   1300 ml  Net  -1060 ml     Physical Exam  Awake Alert,  Sierra Singleton.AT,[name] Supple Neck,No JVD,  Symmetrical Chest wall movement, Good air movement bilaterally,  Irregular, tachycardic,No Gallops,Rubs or new Murmurs, No Parasternal Heave +ve B.Sounds, Abd Soft, No tenderness, No organomegaly appriciated,  No Cyanosis, Clubbing or edema, patient has areas of moist skin loss below breast and groin area secondary to Hailey Hailey disease.   Data Review   Micro Results Recent Results (from the past 240 hour(s))  Urine culture     Status: None   Collection Time: 09/19/15  5:30 PM  Result Value Ref Range Status   Specimen Description URINE, CATHETERIZED  Final   Special Requests NONE  Final   Culture MULTIPLE SPECIES PRESENT, SUGGEST RECOLLECTION  Final   Report Status 09/20/2015 FINAL  Final  Blood Culture (routine x 2)     Status: None (Preliminary result)   Collection Time: 09/19/15  6:28 PM  Result Value Ref Range Status   Specimen Description BLOOD LEFT ARM  Final   Special Requests IN PEDIATRIC BOTTLE 1CC  Final   Culture NO GROWTH 2 DAYS  Final    Report Status PENDING  Incomplete  MRSA PCR Screening     Status: Abnormal   Collection Time: 09/20/15  1:42 AM  Result Value Ref Range Status   MRSA by PCR POSITIVE (A) NEGATIVE Final    Comment:        The GeneXpert MRSA Assay (FDA approved for NASAL specimens only), is one component of a comprehensive MRSA colonization surveillance program. It is not intended to  diagnose MRSA infection nor to guide or monitor treatment for MRSA infections. RESULT CALLED TO, READ BACK BY AND VERIFIED WITH: M INGRAM@0415  09/20/15 MKELLY   Blood Culture (routine x 2)     Status: None (Preliminary result)   Collection Time: 09/20/15  3:08 AM  Result Value Ref Range Status   Specimen Description BLOOD LEFT ANTECUBITAL  Final   Special Requests BOTTLES DRAWN AEROBIC ONLY 4CC  Final   Culture NO GROWTH 1 DAY  Final   Report Status PENDING  Incomplete    Radiology Reports Dg Pelvis 1-2 Views  09/19/2015  CLINICAL DATA:  Fall with injury.  Initial encounter. EXAM: PELVIS - 1-2 VIEW COMPARISON:  CT of the pelvis on 04/08/2015 and 03/23/2015 FINDINGS: Displaced right-sided superior and inferior pubic rami fractures show partial healing and residual deformity. These were more acute in appearance by CT in April and May. No acute fracture or diastasis identified. Hip joints appear intact with mild osteoarthritis present bilaterally. IMPRESSION: Healing displaced fractures of the right superior and inferior pubic rami with residual deformity. No acute injuries identified. Electronically Signed   By: Irish LackGlenn  Yamagata M.D.   On: 09/19/2015 16:44   Ct Head Wo Contrast  09/19/2015  CLINICAL DATA:  81 year old with a fall last week. Knot on the back of the head. Altered mental status. EXAM: CT HEAD WITHOUT CONTRAST TECHNIQUE: Contiguous axial images were obtained from the base of the skull through the vertex without contrast. COMPARISON:  03/22/2015 FINDINGS: Stable cerebral and cerebellar atrophy. Stable low-density  in the periventricular white matter. No evidence for acute hemorrhage, mass lesion, midline shift, hydrocephalus or large infarct. Mild mucosal thickening in the ethmoid air cells. Otherwise, the paranasal sinuses are clear. No calvarial fracture. Evidence for a small scalp contusion along the right posterior scalp. IMPRESSION: No acute intracranial abnormality. Stable atrophy.  Evidence for chronic small vessel ischemic changes. Electronically Signed   By: Richarda OverlieAdam  Henn M.D.   On: 09/19/2015 16:43   Dg Chest Port 1 View  09/19/2015  CLINICAL DATA:  Shortness of breath, confusion.  Lung nodule. EXAM: PORTABLE CHEST - 1 VIEW COMPARISON:  Earlier film of the same day, and previous FINDINGS: Increase in bilateral interstitial edema or infiltrates. Right lower lobe nodule as described previously. Heart size upper limits normal for technique. Atheromatous aorta. No pneumothorax. No definite effusion. Visualized skeletal structures are unremarkable. IMPRESSION: 1. Increase in bilateral interstitial edema or infiltrates since earlier film. Electronically Signed   By: Corlis Leak  Hassell M.D.   On: 09/19/2015 21:11   Dg Chest Port 1 View  09/19/2015  CLINICAL DATA:  Altered mental status.  Unwitnessed fall 1 week ago. EXAM: PORTABLE CHEST 1 VIEW COMPARISON:  02/18/2015 FINDINGS: COPD with chronic hyperinflation and apical emphysematous change. Indistinct opacity at the right base is noted, presumably persistence of the nodular lesion described 06/26/2015. Suspect small left pleural effusion. Borderline cardiomegaly for technique. Negative aortic and hilar contours. Remote proximal right humerus fracture. No acute osseous finding. IMPRESSION: 1. Probable trace left pleural effusion.  No pneumonia or edema. 2. Density at the right base correlating with suspicious nodule on chest CT 06/26/2015. Reference CT report. 3. COPD. Electronically Signed   By: Marnee SpringJonathon  Watts M.D.   On: 09/19/2015 16:45     CBC  Recent Labs Lab  09/19/15 1634 09/20/15 0308 09/21/15 0304  WBC 4.1 3.9* 5.0  HGB 8.9* 8.1* 9.1*  HCT 28.5* 25.9* 30.0*  PLT 148* 140* 164  MCV 97.9 97.7 99.3  MCH  30.6 30.6 30.1  MCHC 31.2 31.3 30.3  RDW 16.7* 16.5* 16.5*    Chemistries   Recent Labs Lab 09/19/15 1634 09/20/15 0308 09/21/15 0304  NA 143 141 141  K 4.4 3.8 3.4*  CL 99* 97* 96*  CO2 37* 30 34*  GLUCOSE 95 101* 96  BUN 35* 25* 24*  CREATININE 1.30* 1.10* 1.12*  CALCIUM 9.0 8.5* 8.6*  MG  --   --  1.2*  AST 20  --   --   ALT 10*  --   --   ALKPHOS 67  --   --   BILITOT 0.5  --   --    ------------------------------------------------------------------------------------------------------------------ estimated creatinine clearance is 39.1 mL/min (by C-G formula based on Cr of 1.12). ------------------------------------------------------------------------------------------------------------------ No results for input(s): HGBA1C in the last 72 hours. ------------------------------------------------------------------------------------------------------------------ No results for input(s): CHOL, HDL, LDLCALC, TRIG, CHOLHDL, LDLDIRECT in the last 72 hours. ------------------------------------------------------------------------------------------------------------------  Recent Labs  09/20/15 0555  TSH 0.567   ------------------------------------------------------------------------------------------------------------------ No results for input(s): VITAMINB12, FOLATE, FERRITIN, TIBC, IRON, RETICCTPCT in the last 72 hours.  Coagulation profile  Recent Labs Lab 09/19/15 1634 09/20/15 0308 09/21/15 0304  INR 5.20* 5.94* 2.79*    No results for input(s): DDIMER in the last 72 hours.  Cardiac Enzymes  Recent Labs Lab 09/19/15 1634  TROPONINI 0.03   ------------------------------------------------------------------------------------------------------------------ Invalid input(s): POCBNP     Time Spent in  minutes   40 minutes   Norah Devin M.D on 09/21/2015 at 4:30 PM  Between 7am to 7pm - Pager - [phone]  After 7pm go to [email or web address] - password [facility]  [facility]   Office  [phone]

## 2015-09-21 NOTE — Progress Notes (Signed)
Pt confused and agitated this morning. HR up to 160's and refusing to take medications. MD was notified and IV cardizem was ordered but pt proceeded to remove her IV. RN and IV team attempted to get IV access but was unsuccessful. Family was called to inform of situation and speak with Pt in hopes of calming her down and having her take her medications. Pt still refusing. MD has been notified and family will be here around 1300. RN will continue to monitor. Lorin PicketLindsey Shirah Roseman, RN

## 2015-09-21 NOTE — ED Provider Notes (Signed)
Arrival Date & Time: 09/19/15 & 1518 History  HPI Limitations: mental status, confusion and dementia. Chief Complaint  Patient presents with  . Hypotension   HPI Sierra Singleton is a 81 y.o. female with a chief complaint of Hypotension and per EMS who provided the HPI. Per daughter in law patient had recent fall and bruise to posterior scalp.  Reported hypotension to 70/40s at nursing home who then called EMS. EMS arrived and found her in AFIB, but normotensive.  Past Medical History  I reviewed & agree with nursing's documentation on PMHx, PSHx, SHx and FHx. Past Medical History  Diagnosis Date  . Rash     [name] rash  . Anxiety   . Thyroid disease   . Hyperlipidemia   . Hypertension   . COPD (chronic obstructive pulmonary disease) (HCC)   . Poor historian   . Carotid artery disease (HCC)   . Atrial fibrillation (HCC) 02/11/2015  . Chronic respiratory failure with hypoxia (HCC) 02/11/2015  . 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 Collagen vascular disease (HCC)   . CHF (congestive heart failure) [facility])    Past Surgical History  Procedure Laterality Date  . Back surgery     Social History   Social History  . Marital Status: Divorced    Spouse Name: N/A  . Number of Children: N/A  . Years of Education: N/A   Social History Main Topics  . Smoking status: Former Games developer  . Smokeless tobacco: None  . Alcohol Use: No  . Drug Use: No  . Sexual Activity: No   Other Topics Concern  . None   Social History Narrative   Family History  Problem Relation Age of Onset  . Stroke Father   . Cancer Mother   . COPD Sister   . Emphysema Sister   . Other Brother     BRAIN TUMOR  . Colon cancer Brother     Review of Systems  Complete ROS obtained and pertinent positive and negatives documented above in HPI. All other ROS negative.  Allergies  Ambien; Sulfa antibiotics; and Clindamycin/lincomycin  Home Medications   Prior to Admission medications   Medication Sig  Start Date End Date Taking? Authorizing Provider  acetaminophen (TYLENOL) 650 MG CR tablet Take 650 mg by mouth every 8 (eight) hours as needed for pain.   Yes Historical Provider, MD  atorvastatin (LIPITOR) 20 MG tablet Take 20 mg by mouth daily at 6 PM.    Yes Historical Provider, MD  calcitonin, salmon, (MIACALCIN/FORTICAL) 200 UNIT/ACT nasal spray Place 1 spray into alternate nostrils daily.   Yes Historical Provider, MD  cyanocobalamin 500 MCG tablet Place 500 mcg under the tongue daily.   Yes Historical Provider, MD  divalproex (DEPAKOTE SPRINKLE) 125 MG capsule Take 125 mg by mouth 2 (two) times daily.   Yes Historical Provider, MD  ferrous sulfate 325 (65 FE) MG tablet Take 325 mg by mouth daily.   Yes Historical Provider, MD  folic acid (FOLVITE) 1 MG tablet Take 1 mg by mouth daily.   Yes Historical Provider, MD  furosemide (LASIX) 40 MG tablet Take 40 mg by mouth daily.   Yes Historical Provider, MD  hydrOXYzine (ATARAX/VISTARIL) 25 MG tablet Take 25 mg by mouth at bedtime.    Yes Historical Provider, MD  hydrOXYzine (ATARAX/VISTARIL) 25 MG tablet Take 25 mg by mouth 3 (three) times daily as needed for itching.   Yes Historical Provider, MD  levalbuterol Pauline Aus) 0.63 MG/3ML nebulizer solution Take 3 mLs (  0.63 mg total) by nebulization every 6 (six) hours as needed for wheezing or shortness of breath. 02/17/15  Yes Erick Blinks, MD  levothyroxine (SYNTHROID, LEVOTHROID) 125 MCG tablet Take 125 mcg by mouth daily before breakfast.   Yes Historical Provider, MD  loratadine (CLARITIN) 10 MG tablet Take 10 mg by mouth daily.   Yes Historical Provider, MD  methotrexate 2.5 MG tablet Take 10 mg by mouth once a week. On Sunday.   Yes Historical Provider, MD  metolazone (ZAROXOLYN) 2.5 MG tablet Take 2.5 mg by mouth every Monday.    Yes Historical Provider, MD  minocycline (MINOCIN,DYNACIN) 100 MG capsule Take 100 mg by mouth 2 (two) times daily.   Yes Historical Provider, MD  OXYGEN Inhale 2 L  into the lungs continuous.   Yes Historical Provider, MD  potassium chloride SA (K-DUR,KLOR-CON) 20 MEQ tablet Take 20 mEq by mouth 3 (three) times daily. For 3 days   Yes Historical Provider, MD  traZODone (DESYREL) 100 MG tablet Take 100 mg by mouth at bedtime.   Yes Historical Provider, MD  Vilazodone HCl (VIIBRYD) 20 MG TABS Take 20 mg by mouth daily.   Yes Historical Provider, MD  Vitamin D, Ergocalciferol, (DRISDOL) 50000 UNITS CAPS capsule Take 50,000 Units by mouth every 7 (seven) days. Take on thursdays   Yes Historical Provider, MD  warfarin (COUMADIN) 2.5 MG tablet Take 2.5 mg by mouth every Tuesday, Thursday, Saturday, and Sunday at 6 PM.    Yes Historical Provider, MD  warfarin (COUMADIN) 5 MG tablet Take 5 mg by mouth every Monday, Wednesday, and Friday at 6 PM. Take 1 tablet every Monday, Wednesday, and Friday   Yes Historical Provider, MD  cloNIDine (CATAPRES) 0.1 MG tablet Take 0.1 mg by mouth 2 (two) times daily as needed (Take only if Blood pressure greater than 200).    Historical Provider, MD  diltiazem (CARDIZEM CD) 180 MG 24 hr capsule Take 1 capsule (180 mg total) by mouth 2 (two) times daily. 09/22/15   Leana Roe Elgergawy, MD  feeding supplement, ENSURE ENLIVE, (ENSURE ENLIVE) LIQD Take 237 mLs by mouth 2 (two) times daily between meals. 09/22/15   Leana Roe Elgergawy, MD  fentaNYL (DURAGESIC - DOSED MCG/HR) 12 MCG/HR Place 1 patch (12.5 mcg total) onto the skin every 3 (three) days. 09/22/15   Leana Roe Elgergawy, MD  HYDROcodone-acetaminophen (NORCO/VICODIN) 5-325 MG tablet Take 1 tablet by mouth every 4 (four) hours as needed for moderate pain. 09/22/15   Leana Roe Elgergawy, MD  LORazepam (ATIVAN) 1 MG tablet Take 1 tablet (1 mg total) by mouth every 12 (twelve) hours as needed for anxiety. 09/22/15   Leana Roe Elgergawy, MD  metoprolol tartrate (LOPRESSOR) 25 MG tablet Take 3 tablets (75 mg total) by mouth 2 (two) times daily. 09/22/15   Starleen Arms, MD    Physical  Exam  BP 120/85 mmHg  Pulse 72  Temp(Src) 97.6 F (36.4 C) (Oral)  Resp 17  Ht  (1.626 m)  Wt 160 lb (72.576 kg)  BMI 27.45 kg/m2  SpO2 100% Physical Exam  Constitutional: She is oriented to person, place, and time. She appears well-developed and well-nourished.  Non-toxic appearance. She appears ill. She appears distressed.  HENT:  Head: Normocephalic.  Right Ear: External ear normal.  Left Ear: External ear normal.  Posterior scalp hematoma.  Eyes: Pupils are equal, round, and reactive to light. No scleral icterus.  Neck: Normal range of motion. Neck supple. No tracheal deviation present.  Cardiovascular: Intact distal pulses.   Murmur heard. Pulmonary/Chest: No stridor. She is in respiratory distress. She has wheezes. She has rales.  Abdominal: Soft. She exhibits no distension. There is no tenderness. There is no rebound and no guarding.  Musculoskeletal: Normal range of motion.  Neurological: She is alert and oriented to person, place, and time. She has normal strength and normal reflexes. No cranial nerve deficit or sensory deficit.  Skin: Skin is warm and dry. No pallor.  Psychiatric: She has a normal mood and affect. Her behavior is normal.    ED Course  Procedures Labs Review Labs Reviewed  MRSA PCR SCREENING - Abnormal; Notable for the following:    MRSA by PCR POSITIVE (*)    All other components within normal limits  CBC - Abnormal; Notable for the following:    RBC 2.91 (*)    Hemoglobin 8.9 (*)    HCT 28.5 (*)    RDW 16.7 (*)    Platelets 148 (*)    All other components within normal limits  COMPREHENSIVE METABOLIC PANEL - Abnormal; Notable for the following:    Chloride 99 (*)    CO2 37 (*)    BUN 35 (*)    Creatinine, Ser 1.30 (*)    Total Protein 5.7 (*)    Albumin 1.9 (*)    ALT 10 (*)    GFR calc non Af Amer 38 (*)    GFR calc Af Amer 44 (*)    All other components within normal limits  PROTIME-INR - Abnormal; Notable for the following:     Prothrombin Time 45.6 (*)    INR 5.20 (*)    All other components within normal limits  URINALYSIS, ROUTINE W REFLEX MICROSCOPIC (NOT AT [facility]) - Abnormal; Notable for the following:    Hgb urine dipstick TRACE (*)    All other components within normal limits  VALPROIC ACID LEVEL - Abnormal; Notable for the following:    Valproic Acid Lvl 20 (*)    All other components within normal limits  URINE MICROSCOPIC-ADD ON - Abnormal; Notable for the following:    Squamous Epithelial / LPF FEW (*)    Casts HYALINE CASTS (*)    All other components within normal limits  BASIC METABOLIC PANEL - Abnormal; Notable for the following:    Chloride 97 (*)    Glucose, Bld 101 (*)    BUN 25 (*)    Creatinine, Ser 1.10 (*)    Calcium 8.5 (*)    GFR calc non Af Amer 46 (*)    GFR calc Af Amer 53 (*)    All other components within normal limits  CBC - Abnormal; Notable for the following:    WBC 3.9 (*)    RBC 2.65 (*)    Hemoglobin 8.1 (*)    HCT 25.9 (*)    RDW 16.5 (*)    Platelets 140 (*)    All other components within normal limits  BLOOD GAS, ARTERIAL - Abnormal; Notable for the following:    pCO2 arterial 49.9 (*)    pO2, Arterial 69.4 (*)    Bicarbonate 33.0 (*)    Acid-Base Excess 8.5 (*)    All other components within normal limits  PROTIME-INR - Abnormal; Notable for the following:    Prothrombin Time 51.1 (*)    INR 5.94 (*)    All other components within normal limits  PROTIME-INR - Abnormal; Notable for the following:    Prothrombin Time 29.0 (*)  INR 2.79 (*)    All other components within normal limits  CBC - Abnormal; Notable for the following:    RBC 3.02 (*)    Hemoglobin 9.1 (*)    HCT 30.0 (*)    RDW 16.5 (*)    All other components within normal limits  BASIC METABOLIC PANEL - Abnormal; Notable for the following:    Potassium 3.4 (*)    Chloride 96 (*)    CO2 34 (*)    BUN 24 (*)    Creatinine, Ser 1.12 (*)    Calcium 8.6 (*)    GFR calc non Af Amer 45 (*)     GFR calc Af Amer 52 (*)    All other components within normal limits  MAGNESIUM - Abnormal; Notable for the following:    Magnesium 1.2 (*)    All other components within normal limits  PROTIME-INR - Abnormal; Notable for the following:    Prothrombin Time 18.8 (*)    INR 1.57 (*)    All other components within normal limits  BASIC METABOLIC PANEL - Abnormal; Notable for the following:    Potassium 3.2 (*)    Chloride 89 (*)    CO2 36 (*)    Creatinine, Ser 1.15 (*)    Calcium 8.4 (*)    GFR calc non Af Amer 44 (*)    GFR calc Af Amer 51 (*)    All other components within normal limits  MAGNESIUM - Abnormal; Notable for the following:    Magnesium 1.5 (*)    All other components within normal limits  I-STAT VENOUS BLOOD GAS, ED - Abnormal; Notable for the following:    pH, Ven 7.446 (*)    pCO2, Ven 57.4 (*)    pO2, Ven 27.0 (*)    Bicarbonate 39.5 (*)    Acid-Base Excess 14.0 (*)    All other components within normal limits  CULTURE, BLOOD (ROUTINE X 2)  CULTURE, BLOOD (ROUTINE X 2)  URINE CULTURE  TROPONIN I  TSH  I-STAT CG4 LACTIC ACID, ED  SAMPLE TO BLOOD BANK    Imaging Review Dg Chest 2 View  09/23/2015  CLINICAL DATA:  Status post fall, with right arm pain and weakness. Concern for chest injury. Initial encounter. EXAM: CHEST  2 VIEW COMPARISON:  Chest radiograph performed 09/19/2015 FINDINGS: The lungs are well-aerated. Vascular congestion is noted. Mildly increased interstitial markings raise concern for mild interstitial edema. Mild bibasilar opacities likely reflect atelectasis. Small bilateral pleural effusions are seen. No pneumothorax is identified. The heart is mildly enlarged. No acute osseous abnormalities are seen. There is chronic deformity of the right humeral head. IMPRESSION: 1. Vascular congestion and mild cardiomegaly. Mildly increased interstitial markings raise concern for mild interstitial edema. Mild bibasilar opacities likely reflect  atelectasis. Small bilateral pleural effusions seen. 2. No displaced rib fracture seen. Electronically Signed   By: Roanna Raider M.D.   On: 09/23/2015 05:52   Dg Forearm Right  09/23/2015  CLINICAL DATA:  Larey Seat today, forehead bruising, weakness RIGHT arm. EXAM: RIGHT FOREARM - 2 VIEW COMPARISON:  None. FINDINGS: There is no evidence of fracture or other focal bone lesions. Soft tissues are unremarkable. IMPRESSION: Negative. Electronically Signed   By: Awilda Metro M.D.   On: 09/23/2015 05:58   Ct Head Wo Contrast  09/23/2015  CLINICAL DATA:  Status post fall, with large right parietal scalp hematoma. Initial encounter. EXAM: CT HEAD WITHOUT CONTRAST CT MAXILLOFACIAL WITHOUT CONTRAST TECHNIQUE: Multidetector CT imaging  of the head and maxillofacial structures were performed using the standard protocol without intravenous contrast. Multiplanar CT image reconstructions of the maxillofacial structures were also generated. COMPARISON:  CT of the head performed 09/19/2015 FINDINGS: CT HEAD FINDINGS There is no evidence of acute infarction, mass lesion, or intra- or extra-axial hemorrhage on CT. Prominence of the ventricles and sulci reflects mild cortical volume loss. Mild cerebellar atrophy is noted. Scattered periventricular and subcortical white matter change likely reflects small vessel ischemic microangiopathy. The brainstem and fourth ventricle are within normal limits. The basal ganglia are unremarkable in appearance. The cerebral hemispheres demonstrate grossly normal gray-white differentiation. No mass effect or midline shift is seen. There is no evidence of fracture; visualized osseous structures are unremarkable in appearance. The orbits are within normal limits. The paranasal sinuses and mastoid air cells are well-aerated. Soft tissue swelling is noted overlying the right parietal calvarium. CT MAXILLOFACIAL FINDINGS There is no evidence of fracture or dislocation. The maxilla and mandible  appear intact. The nasal bone is unremarkable in appearance. The visualized dentition demonstrates no acute abnormality. The orbits are intact bilaterally. The visualized paranasal sinuses and mastoid air cells are well-aerated. Soft tissue swelling is noted lateral to the right orbit. The parapharyngeal fat planes are preserved. The nasopharynx, oropharynx and hypopharynx are unremarkable in appearance. The visualized portions of the valleculae and piriform sinuses are grossly unremarkable. The parotid and submandibular glands are within normal limits. No cervical lymphadenopathy is seen. IMPRESSION: 1. No evidence of traumatic intracranial injury or fracture. 2. No evidence of fracture or dislocation with regard to the maxillofacial structures. 3. Soft tissue swelling overlying the right parietal calvarium, extending lateral to the right orbit. 4. Mild cortical volume loss and scattered small vessel ischemic microangiopathy. Electronically Signed   By: Roanna Raider M.D.   On: 09/23/2015 05:42   Ct Maxillofacial Wo Cm  09/23/2015  CLINICAL DATA:  Status post fall, with large right parietal scalp hematoma. Initial encounter. EXAM: CT HEAD WITHOUT CONTRAST CT MAXILLOFACIAL WITHOUT CONTRAST TECHNIQUE: Multidetector CT imaging of the head and maxillofacial structures were performed using the standard protocol without intravenous contrast. Multiplanar CT image reconstructions of the maxillofacial structures were also generated. COMPARISON:  CT of the head performed 09/19/2015 FINDINGS: CT HEAD FINDINGS There is no evidence of acute infarction, mass lesion, or intra- or extra-axial hemorrhage on CT. Prominence of the ventricles and sulci reflects mild cortical volume loss. Mild cerebellar atrophy is noted. Scattered periventricular and subcortical white matter change likely reflects small vessel ischemic microangiopathy. The brainstem and fourth ventricle are within normal limits. The basal ganglia are unremarkable  in appearance. The cerebral hemispheres demonstrate grossly normal gray-white differentiation. No mass effect or midline shift is seen. There is no evidence of fracture; visualized osseous structures are unremarkable in appearance. The orbits are within normal limits. The paranasal sinuses and mastoid air cells are well-aerated. Soft tissue swelling is noted overlying the right parietal calvarium. CT MAXILLOFACIAL FINDINGS There is no evidence of fracture or dislocation. The maxilla and mandible appear intact. The nasal bone is unremarkable in appearance. The visualized dentition demonstrates no acute abnormality. The orbits are intact bilaterally. The visualized paranasal sinuses and mastoid air cells are well-aerated. Soft tissue swelling is noted lateral to the right orbit. The parapharyngeal fat planes are preserved. The nasopharynx, oropharynx and hypopharynx are unremarkable in appearance. The visualized portions of the valleculae and piriform sinuses are grossly unremarkable. The parotid and submandibular glands are within normal limits. No cervical lymphadenopathy is  seen. IMPRESSION: 1. No evidence of traumatic intracranial injury or fracture. 2. No evidence of fracture or dislocation with regard to the maxillofacial structures. 3. Soft tissue swelling overlying the right parietal calvarium, extending lateral to the right orbit. 4. Mild cortical volume loss and scattered small vessel ischemic microangiopathy. Electronically Signed   By: Roanna RaiderJeffery  Chang M.D.   On: 09/23/2015 05:42    Laboratory and Imaging results were personally reviewed by myself and used in the medical decision making of this patient's treatment and disposition.  EKG Interpretation  EKG Interpretation  Date/Time:  Tuesday September 19 2015 15:26:51 EDT Ventricular Rate:  108 PR Interval:    QRS Duration: 76 QT Interval:  359 QTC Calculation: 481 R Axis:   77 Text Interpretation:  Atrial fibrillation Ventricular premature  complex `no acute st/t changes as compared to prior ecg Confirmed by Denton LankSTEINL  MD, Caryn BeeKEVIN ([id number]) on 09/19/2015 4:10:49 PM      MDM  Wilnette KalesShelby J Kloth is a 81 y.o. female with H&P as above. ED clinical course as follows:  Patient arrives without hypotension however tenuous systolic BP in low 100s. Per daughter in law who is at bedside, the patient is confused. Patient is alert and oriented to place and person, with confusion to time. Rectal temp reveals patient is afebrile. However full labs sent, BCs obtained. CT Head obtained which shows NAICA.   ECG reveals AFIB with PVCs and without changes of ischemia. VBG without gross abnormality.   BP addressed with boluses of IVF that resulted in marked improvement in low systolic BPs, which were now in upper 110s. Patient AMS improved and given IV diltizem for AFIB rate control. No longer hypotensive therefore will not shock.   After Fluid patient began to have increased WOB found to be related to IVFs required for BP maintenance. Patient therefore given IV lasix and placed upon BiLEVEL which resulted in marked improvement in WOB.  I obtained consultation with the Hospitalist and Cardiology service for concerns of CHF from Afib now requiring BiLEVEL due to required IVFs from tenuous BP. I discussed the patients clinical course including their H&P, as well as, their diagnostic studies. Based upon that discussion, we've decided that the patient will require admission to intermediate level of care.   Clinical Impression:  1. SOB (shortness of breath)    Disposition:  Admit step down  Patient care discussed with Dr. Denton LankSteinl, who oversaw their evaluation & treatment & voiced agreement. House Officer: Jonette EvaBrad Dorsel Flinn, MD, Emergency Medicine.   Jonette EvaBrad Marigny Borre, MD 09/24/15 [phone]  Cathren LaineKevin Steinl, MD 09/25/15 [phone]

## 2015-09-22 DIAGNOSIS — I10 Essential (primary) hypertension: Secondary | ICD-10-CM

## 2015-09-22 DIAGNOSIS — I4891 Unspecified atrial fibrillation: Secondary | ICD-10-CM

## 2015-09-22 DIAGNOSIS — J9620 Acute and chronic respiratory failure, unspecified whether with hypoxia or hypercapnia: Secondary | ICD-10-CM

## 2015-09-22 DIAGNOSIS — I5033 Acute on chronic diastolic (congestive) heart failure: Secondary | ICD-10-CM

## 2015-09-22 LAB — BASIC METABOLIC PANEL
ANION GAP: 13 (ref 5–15)
BUN: 17 mg/dL (ref 6–20)
CALCIUM: 8.4 mg/dL — AB (ref 8.9–10.3)
CHLORIDE: 89 mmol/L — AB (ref 101–111)
CO2: 36 mmol/L — AB (ref 22–32)
CREATININE: 1.15 mg/dL — AB (ref 0.44–1.00)
GFR calc non Af Amer: 44 mL/min — ABNORMAL LOW (ref >60)
GFR, EST AFRICAN AMERICAN: 51 mL/min — AB (ref >60)
Glucose, Bld: 82 mg/dL (ref 65–99)
Potassium: 3.2 mmol/L — ABNORMAL LOW (ref 3.5–5.1)
Sodium: 138 mmol/L (ref 135–145)

## 2015-09-22 LAB — PROTIME-INR
INR: 1.57 — ABNORMAL HIGH (ref 0.00–1.49)
Prothrombin Time: 18.8 seconds — ABNORMAL HIGH (ref 11.6–15.2)

## 2015-09-22 LAB — MAGNESIUM: Magnesium: 1.5 mg/dL — ABNORMAL LOW (ref 1.7–2.4)

## 2015-09-22 MED ORDER — MAGNESIUM SULFATE 4 GM/100ML IV SOLN
4.0000 g | Freq: Once | INTRAVENOUS | Status: AC
  Administered 2015-09-22: 4 g via INTRAVENOUS
  Filled 2015-09-22: qty 100

## 2015-09-22 MED ORDER — METOPROLOL TARTRATE 50 MG PO TABS
75.0000 mg | ORAL_TABLET | Freq: Two times a day (BID) | ORAL | Status: DC
  Administered 2015-09-22: 75 mg via ORAL
  Filled 2015-09-22 (×2): qty 1

## 2015-09-22 MED ORDER — POTASSIUM CHLORIDE CRYS ER 20 MEQ PO TBCR
40.0000 meq | EXTENDED_RELEASE_TABLET | ORAL | Status: DC
  Administered 2015-09-22 (×2): 40 meq via ORAL
  Filled 2015-09-22: qty 2

## 2015-09-22 MED ORDER — FENTANYL 12 MCG/HR TD PT72: 12.5000 ug | MEDICATED_PATCH | TRANSDERMAL | Status: AC

## 2015-09-22 MED ORDER — POTASSIUM CHLORIDE CRYS ER 20 MEQ PO TBCR: 40.0000 meq | EXTENDED_RELEASE_TABLET | ORAL | Status: DC

## 2015-09-22 MED ORDER — WARFARIN SODIUM 4 MG PO TABS
4.0000 mg | ORAL_TABLET | Freq: Once | ORAL | Status: DC
  Filled 2015-09-22: qty 1

## 2015-09-22 MED ORDER — HYDROCODONE-ACETAMINOPHEN 5-325 MG PO TABS: 1.0000 | ORAL_TABLET | ORAL | Status: AC | PRN

## 2015-09-22 MED ORDER — METOPROLOL TARTRATE 25 MG PO TABS: 75.0000 mg | ORAL_TABLET | Freq: Two times a day (BID) | ORAL | Status: DC

## 2015-09-22 MED ORDER — MAGNESIUM SULFATE 4 GM/100ML IV SOLN: 4.0000 g | Freq: Once | INTRAVENOUS | Status: DC

## 2015-09-22 MED ORDER — LORAZEPAM 1 MG PO TABS: 1.0000 mg | ORAL_TABLET | Freq: Two times a day (BID) | ORAL | Status: AC | PRN

## 2015-09-22 MED ORDER — DILTIAZEM HCL ER COATED BEADS 180 MG PO CP24: 180.0000 mg | ORAL_CAPSULE | Freq: Two times a day (BID) | ORAL | Status: AC

## 2015-09-22 MED ORDER — ENSURE ENLIVE PO LIQD: 237.0000 mL | Freq: Two times a day (BID) | ORAL | Status: AC

## 2015-09-22 NOTE — Discharge Instructions (Signed)
Follow with Primary MD VYAS,DHRUV B., MD in 7 days   Get CBC, CMP, 2 view Chest X ray checked  by Primary MD next visit.    Activity: As tolerated with Full fall precautions use walker/cane & assistance as needed   Disposition SNF   Diet: Heart Healthy  , with feeding assistance and aspiration precautions.  For Heart failure patients - Check your Weight same time everyday, if you gain over 2 pounds, or you develop in leg swelling, experience more shortness of breath or chest pain, call your Primary MD immediately. Follow Cardiac Low Salt Diet and 1.5 lit/day fluid restriction.   On your next visit with your primary care physician please Get Medicines reviewed and adjusted.   Please request your Prim.MD to go over all Hospital Tests and Procedure/Radiological results at the follow up, please get all Hospital records sent to your Prim MD by signing hospital release before you go home.   If you experience worsening of your admission symptoms, develop shortness of breath, life threatening emergency, suicidal or homicidal thoughts you must seek medical attention immediately by calling 911 or calling your MD immediately  if symptoms less severe.  You Must read complete instructions/literature along with all the possible adverse reactions/side effects for all the Medicines you take and that have been prescribed to you. Take any new Medicines after you have completely understood and accpet all the possible adverse reactions/side effects.   Do not drive, operating heavy machinery, perform activities at heights, swimming or participation in water activities or provide baby sitting services if your were admitted for syncope or siezures until you have seen by Primary MD or a Neurologist and advised to do so again.  Do not drive when taking Pain medications.    Do not take more than prescribed Pain, Sleep and Anxiety Medications  Special Instructions: If you have smoked or chewed Tobacco  in the  last 2 yrs please stop smoking, stop any regular Alcohol  and or any Recreational drug use.  Wear Seat belts while driving.   Please note  You were cared for by a hospitalist during your hospital stay. If you have any questions about your discharge medications or the care you received while you were in the hospital after you are discharged, you can call the unit and asked to speak with the hospitalist on call if the hospitalist that took care of you is not available. Once you are discharged, your primary care physician will handle any further medical issues. Please note that NO REFILLS for any discharge medications will be authorized once you are discharged, as it is imperative that you return to your primary care physician (or establish a relationship with a primary care physician if you do not have one) for your aftercare needs so that they can reassess your need for medications and monitor your lab values.

## 2015-09-22 NOTE — Progress Notes (Signed)
Patient will discharge to [facility] Anticipated discharge date:10/28 Family notified:pt son Transportation by [facility]- scheduled for 2pm  CSW signing off.  Merlyn LotJenna Holoman, LCSWA Clinical Social Worker [phone]

## 2015-09-22 NOTE — Progress Notes (Signed)
PT Cancellation Note  Patient Details Name: Sierra KalesShelby J Eriksson MRN: [id number] DOB: 01/15/1933   Cancelled Treatment:    Reason Eval/Treat Not Completed: Other (comment) (Pt refused PT adamantly.  "I am not getting up today.")   Berline LopesWhite, Lolita Faulds F 09/22/2015, 12:36 PM  Entergy CorporationDawn Malvern Kadlec,PT Acute Rehabilitation [phone] (pager)

## 2015-09-22 NOTE — Care Management Important Message (Signed)
Important Message  Patient Details  Name: Sierra KalesShelby J Rago MRN: [id number] Date of Birth: 11/03/1933   Medicare Important Message Given:  [facility] given    Magdalene RiverMayo, Caryssa Elzey T, RN 09/22/2015, 2:08 PM

## 2015-09-22 NOTE — Discharge Summary (Signed)
Sierra Singleton, is a 81 y.o. female  DOB 04-27-1933  MRN [id number].  Admission date:  09/19/2015  Admitting Physician  Alberteen Sam, MD  Discharge Date:  09/22/2015   Primary MD  Ignatius Specking., MD  Recommendations for primary care physician for things to follow:  - Please check CBC, BMP , and magnesium during next visit. - Patient is on warfarin for A. Fib, please check INR level and adjust warfarin as needed.   Admission Diagnosis  SOB (shortness of breath) [R06.02]   Discharge Diagnosis  SOB (shortness of breath) [R06.02]   Principal Problem:   Atrial fibrillation with RVR (HCC) Active Problems:   COPD (chronic obstructive pulmonary disease) (HCC)   Hypothyroidism   Atrial fibrillation (HCC)   Chronic diastolic CHF (congestive heart failure) (HCC)   Protein-calorie malnutrition (HCC)   CKD (chronic kidney disease)   Acute on chronic respiratory failure (HCC)   Pressure ulcer      Past Medical History  Diagnosis Date  . Rash     [name] rash  . Anxiety   . Thyroid disease   . Hyperlipidemia   . Hypertension   . COPD (chronic obstructive pulmonary disease) (HCC)   . Poor historian   . Carotid artery disease (HCC)   . Atrial fibrillation (HCC) 02/11/2015  . Chronic respiratory failure with hypoxia (HCC) 02/11/2015  . 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 Collagen vascular disease (HCC)   . CHF (congestive heart failure) [facility])     Past Surgical History  Procedure Laterality Date  . Back surgery         History of present illness and  Hospital Course:     Kindly see H&P for history of present illness and admission details, please review complete Labs, Consult reports and Test reports for all details in brief  HPI  from the history and physical done on the day of admission   HPI: Sierra Singleton is a 82 y.o. female with a past medical history significant for COPD  on home O2, chronic diastolic CHF, chronic anemia, A. fib on warfarin, CKD stage III, hypothyroidism, and lung nodule with upcoming PET who presents with hypotension and brief altered mental status from the nursing home.  History is collected from the son and from EDP, as the patient is a poor historian. Evidently staff of the patient's nursing home noticed today that she was "talking out of her head". Her vitals were taken and it was noted that she was hypotensive to the 70s over 40s, and EMS was called. EMS found the patient to be in afib with RVR but normotensive and saturating well on her home O2.   In the ED, the patient was given fluid boluses for hypotension and IV diltiazem injections for afib. She was oriented and her son felt that she was at her cognitive baseline. After fluid administration the patient became dyspneic and was placed on BiPAP and given Lasix. [facility] was asked to admit for A. fib with RVR and acute diastolic CHF  exacerbation.   Hospital Course   Atrial fibrillation with RVR:  - Rate uncontrolled on presentation, acquiring initially Cardizem drip, on metoprolol and Cardizem at home , off cardizem drip , overnight, heart rate remains in the low 100s, Cardizem was increased from 120 twice a day to 180 twice a day yesterday, metoprolol increased from 25 mg twice a day to 50 mg twice a day yesterday, it increased to 75 mg twice a day today, - She has CHADS2Vasc 5, on warfarin ,  - Cardiology consult appreciated  Fluid overload, acute diastolic CHF:  - Last EF in March was 60-65%. The patient was given boluses of fluids on arrival to the ER, developed volume overload and then required Bipap and furosemide. - Required BiPAP on admission, no further need since - on home dose metolazone and Lasix, currently appears euvolemic.   Acute on chronic hypoxic respiratory failure - patient is on 2 L nasal cannula at baseline, required BiPAP  for volume overload on admission,  improved after diuresis, back to baseline.  COPD on home O2:  - No active wheezing, continue with nebs as needed  Acute delerium - Improving  CKD III:  Stable.  Supratherapeutic INR:  - improved with vitamin K.  Depression and chronic pain:  -Cotninue home  fentanyl, hydrocodone, and divalproex  Lung nodule - Workup as an outpatient, has upcoming PET scans  [name] - Wound consult appreciated. - Already on minocycline and methotrexate as an outpatient(currently methotrexate is on hold)  Pressure ulcers - Wound care consult appreciated  Hypokalemia, hypomagnesemia - Received potassium and magnesium. Her discharge  Discharge Condition:  Stable   Follow UP  Follow-up Information    Follow up with VYAS,DHRUV B., MD. Call in 1 week.   Specialty:  Internal Medicine   Why:  Posthospitalization follow-up.   Contact information:   [address] [place] [id number] [phone]         Discharge Instructions  and  Discharge Medications     Discharge Instructions    Diet - low sodium heart healthy    Complete by:  As directed      Discharge instructions    Complete by:  As directed   Follow with Primary MD VYAS,DHRUV B., MD in 7 days   Get CBC, CMP, 2 view Chest X ray checked  by Primary MD next visit.    Activity: As tolerated with Full fall precautions use walker/cane & assistance as needed   Disposition SNF   Diet: Heart Healthy ** , with feeding assistance and aspiration precautions.  For Heart failure patients - Check your Weight same time everyday, if you gain over 2 pounds, or you develop in leg swelling, experience more shortness of breath or chest pain, call your Primary MD immediately. Follow Cardiac Low Salt Diet and 1.5 lit/day fluid restriction.   On your next visit with your primary care physician please Get Medicines reviewed and adjusted.   Please request your Prim.MD to go over all Hospital Tests and  Procedure/Radiological results at the follow up, please get all Hospital records sent to your Prim MD by signing hospital release before you go home.   If you experience worsening of your admission symptoms, develop shortness of breath, life threatening emergency, suicidal or homicidal thoughts you must seek medical attention immediately by calling 911 or calling your MD immediately  if symptoms less severe.  You Must read complete instructions/literature along with all the possible adverse reactions/side effects for all the  Medicines you take and that have been prescribed to you. Take any new Medicines after you have completely understood and accpet all the possible adverse reactions/side effects.   Do not drive, operating heavy machinery, perform activities at heights, swimming or participation in water activities or provide baby sitting services if your were admitted for syncope or siezures until you have seen by Primary MD or a Neurologist and advised to do so again.  Do not drive when taking Pain medications.    Do not take more than prescribed Pain, Sleep and Anxiety Medications  Special Instructions: If you have smoked or chewed Tobacco  in the last 2 yrs please stop smoking, stop any regular Alcohol  and or any Recreational drug use.  Wear Seat belts while driving.   Please note  You were cared for by a hospitalist during your hospital stay. If you have any questions about your discharge medications or the care you received while you were in the hospital after you are discharged, you can call the unit and asked to speak with the hospitalist on call if the hospitalist that took care of you is not available. Once you are discharged, your primary care physician will handle any further medical issues. Please note that NO REFILLS for any discharge medications will be authorized once you are discharged, as it is imperative that you return to your primary care physician (or establish a  relationship with a primary care physician if you do not have one) for your aftercare needs so that they can reassess your need for medications and monitor your lab values.     Increase activity slowly    Complete by:  As directed             Medication List    STOP taking these medications        cloNIDine 0.1 MG tablet  Commonly known as:  CATAPRES      TAKE these medications        acetaminophen 650 MG CR tablet  Commonly known as:  TYLENOL  Take 650 mg by mouth every 8 (eight) hours as needed for pain.     atorvastatin 20 MG tablet  Commonly known as:  LIPITOR  Take 20 mg by mouth daily at 6 PM.     calcitonin (salmon) 200 UNIT/ACT nasal spray  Commonly known as:  MIACALCIN/FORTICAL  Place 1 spray into alternate nostrils daily.     cyanocobalamin 500 MCG tablet  Place 500 mcg under the tongue daily.     diltiazem 180 MG 24 hr capsule  Commonly known as:  CARDIZEM CD  Take 1 capsule (180 mg total) by mouth 2 (two) times daily.     divalproex 125 MG capsule  Commonly known as:  DEPAKOTE SPRINKLE  Take 125 mg by mouth 2 (two) times daily.     feeding supplement (ENSURE ENLIVE) Liqd  Take 237 mLs by mouth 2 (two) times daily between meals.     fentaNYL 12 MCG/HR  Commonly known as:  DURAGESIC - dosed mcg/hr  Place 12.5 mcg onto the skin every 3 (three) days.     ferrous sulfate 325 (65 FE) MG tablet  Take 325 mg by mouth daily.     folic acid 1 MG tablet  Commonly known as:  FOLVITE  Take 1 mg by mouth daily.     furosemide 40 MG tablet  Commonly known as:  LASIX  Take 40 mg by mouth daily.     HYDROcodone-acetaminophen 5-325  MG tablet  Commonly known as:  NORCO/VICODIN  Take 1 tablet by mouth every 4 (four) hours as needed for moderate pain.     hydrOXYzine 25 MG tablet  Commonly known as:  ATARAX/VISTARIL  Take 25 mg by mouth at bedtime.     hydrOXYzine 25 MG tablet  Commonly known as:  ATARAX/VISTARIL  Take 25 mg by mouth 3 (three) times daily  as needed for itching.     levalbuterol 0.63 MG/3ML nebulizer solution  Commonly known as:  XOPENEX  Take 3 mLs (0.63 mg total) by nebulization every 6 (six) hours as needed for wheezing or shortness of breath.     levothyroxine 125 MCG tablet  Commonly known as:  SYNTHROID, LEVOTHROID  Take 125 mcg by mouth daily before breakfast.     loratadine 10 MG tablet  Commonly known as:  CLARITIN  Take 10 mg by mouth daily.     LORazepam 1 MG tablet  Commonly known as:  ATIVAN  Take 1 mg by mouth every 12 (twelve) hours as needed for anxiety.     methotrexate 2.5 MG tablet  Take 10 mg by mouth once a week. On Sunday.     metolazone 2.5 MG tablet  Commonly known as:  ZAROXOLYN  Take 2.5 mg by mouth every Monday.     metoprolol tartrate 25 MG tablet  Commonly known as:  LOPRESSOR  Take 3 tablets (75 mg total) by mouth 2 (two) times daily.     minocycline 100 MG capsule  Commonly known as:  MINOCIN,DYNACIN  Take 100 mg by mouth 2 (two) times daily.     OXYGEN  Inhale 2 L into the lungs continuous.     potassium chloride SA 20 MEQ tablet  Commonly known as:  K-DUR,KLOR-CON  Take 20 mEq by mouth 3 (three) times daily.     traZODone 100 MG tablet  Commonly known as:  DESYREL  Take 100 mg by mouth at bedtime.     VIIBRYD 20 MG Tabs  Generic drug:  Vilazodone HCl  Take 20 mg by mouth daily.     Vitamin D (Ergocalciferol) 50000 UNITS Caps capsule  Commonly known as:  DRISDOL  Take 50,000 Units by mouth every 7 (seven) days. Take on thursdays     warfarin 5 MG tablet  Commonly known as:  COUMADIN  Take 5 mg by mouth every Monday, Wednesday, and Friday at 6 PM. Take 1 tablet every Monday, Wednesday, and Friday     warfarin 2.5 MG tablet  Commonly known as:  COUMADIN  Take 2.5 mg by mouth every Tuesday, Thursday, Saturday, and Sunday at 6 PM.          Diet and Activity recommendation: See Discharge Instructions above   Consults obtained -  Cardiology   Major  procedures and Radiology Reports - PLEASE review detailed and final reports for all details, in brief -     Dg Pelvis 1-2 Views  09/19/2015  CLINICAL DATA:  Fall with injury.  Initial encounter. EXAM: PELVIS - 1-2 VIEW COMPARISON:  CT of the pelvis on 04/08/2015 and 03/23/2015 FINDINGS: Displaced right-sided superior and inferior pubic rami fractures show partial healing and residual deformity. These were more acute in appearance by CT in April and May. No acute fracture or diastasis identified. Hip joints appear intact with mild osteoarthritis present bilaterally. IMPRESSION: Healing displaced fractures of the right superior and inferior pubic rami with residual deformity. No acute injuries identified. Electronically Signed   By: Sherrine Maples  Fredia Sorrow M.D.   On: 09/19/2015 16:44   Ct Head Wo Contrast  09/19/2015  CLINICAL DATA:  81 year old with a fall last week. Knot on the back of the head. Altered mental status. EXAM: CT HEAD WITHOUT CONTRAST TECHNIQUE: Contiguous axial images were obtained from the base of the skull through the vertex without contrast. COMPARISON:  03/22/2015 FINDINGS: Stable cerebral and cerebellar atrophy. Stable low-density in the periventricular white matter. No evidence for acute hemorrhage, mass lesion, midline shift, hydrocephalus or large infarct. Mild mucosal thickening in the ethmoid air cells. Otherwise, the paranasal sinuses are clear. No calvarial fracture. Evidence for a small scalp contusion along the right posterior scalp. IMPRESSION: No acute intracranial abnormality. Stable atrophy.  Evidence for chronic small vessel ischemic changes. Electronically Signed   By: Richarda Overlie M.D.   On: 09/19/2015 16:43   Dg Chest Port 1 View  09/19/2015  CLINICAL DATA:  Shortness of breath, confusion.  Lung nodule. EXAM: PORTABLE CHEST - 1 VIEW COMPARISON:  Earlier film of the same day, and previous FINDINGS: Increase in bilateral interstitial edema or infiltrates. Right lower lobe  nodule as described previously. Heart size upper limits normal for technique. Atheromatous aorta. No pneumothorax. No definite effusion. Visualized skeletal structures are unremarkable. IMPRESSION: 1. Increase in bilateral interstitial edema or infiltrates since earlier film. Electronically Signed   By: Corlis Leak M.D.   On: 09/19/2015 21:11   Dg Chest Port 1 View  09/19/2015  CLINICAL DATA:  Altered mental status.  Unwitnessed fall 1 week ago. EXAM: PORTABLE CHEST 1 VIEW COMPARISON:  02/18/2015 FINDINGS: COPD with chronic hyperinflation and apical emphysematous change. Indistinct opacity at the right base is noted, presumably persistence of the nodular lesion described 06/26/2015. Suspect small left pleural effusion. Borderline cardiomegaly for technique. Negative aortic and hilar contours. Remote proximal right humerus fracture. No acute osseous finding. IMPRESSION: 1. Probable trace left pleural effusion.  No pneumonia or edema. 2. Density at the right base correlating with suspicious nodule on chest CT 06/26/2015. Reference CT report. 3. COPD. Electronically Signed   By: Marnee Spring M.D.   On: 09/19/2015 16:45    Micro Results     Recent Results (from the past 240 hour(s))  Urine culture     Status: None   Collection Time: 09/19/15  5:30 PM  Result Value Ref Range Status   Specimen Description URINE, CATHETERIZED  Final   Special Requests NONE  Final   Culture MULTIPLE SPECIES PRESENT, SUGGEST RECOLLECTION  Final   Report Status 09/20/2015 FINAL  Final  Blood Culture (routine x 2)     Status: None (Preliminary result)   Collection Time: 09/19/15  6:28 PM  Result Value Ref Range Status   Specimen Description BLOOD LEFT ARM  Final   Special Requests IN PEDIATRIC BOTTLE 1CC  Final   Culture NO GROWTH 2 DAYS  Final   Report Status PENDING  Incomplete  MRSA PCR Screening     Status: Abnormal   Collection Time: 09/20/15  1:42 AM  Result Value Ref Range Status   MRSA by PCR POSITIVE (A)  NEGATIVE Final    Comment:        The GeneXpert MRSA Assay (FDA approved for NASAL specimens only), is one component of a comprehensive MRSA colonization surveillance program. It is not intended to diagnose MRSA infection nor to guide or monitor treatment for MRSA infections. RESULT CALLED TO, READ BACK BY AND VERIFIED WITH: M INGRAM@0415  09/20/15 MKELLY   Blood Culture (routine x 2)  Status: None (Preliminary result)   Collection Time: 09/20/15  3:08 AM  Result Value Ref Range Status   Specimen Description BLOOD LEFT ANTECUBITAL  Final   Special Requests BOTTLES DRAWN AEROBIC ONLY 4CC  Final   Culture NO GROWTH 1 DAY  Final   Report Status PENDING  Incomplete       Today   Subjective:   Harbour Nordmeyer today has no headache,no chest abdominal pain, feels much better  today.   Objective:   Blood pressure 120/85, pulse 72, temperature 98.8 F (37.1 C), temperature source Oral, resp. rate 17, height  (1.626 m), weight 72.576 kg (160 lb), SpO2 100 %.   Intake/Output Summary (Last 24 hours) at 09/22/15 1241 Last data filed at 09/22/15 1133  Gross per 24 hour  Intake    220 ml  Output   1250 ml  Net  -1030 ml    Exam Awake Alert,  [place].AT,[name] Supple Neck,No JVD,  Symmetrical Chest wall movement, Good air movement bilaterally,  Irregular, tachycardic,No Gallops,Rubs or new Murmurs, No Parasternal Heave +ve B.Sounds, Abd Soft, No tenderness, No organomegaly appriciated,  No Cyanosis, Clubbing or edema, patient has areas of moist skin loss below breast and groin area secondary to Hailey Hailey disease.   Data Review   CBC w Diff: Lab Results  Component Value Date   WBC 5.0 09/21/2015   HGB 9.1* 09/21/2015   HCT 30.0* 09/21/2015   HCT 33.0* 02/09/2015   PLT 164 09/21/2015   LYMPHOPCT 34 04/06/2015   MONOPCT 5 04/06/2015   EOSPCT 6* 04/06/2015   BASOPCT 0 04/06/2015    CMP: Lab Results  Component Value Date   NA 138 09/22/2015   K 3.2*  09/22/2015   CL 89* 09/22/2015   CO2 36* 09/22/2015   BUN 17 09/22/2015   CREATININE 1.15* 09/22/2015   PROT 5.7* 09/19/2015   ALBUMIN 1.9* 09/19/2015   BILITOT 0.5 09/19/2015   ALKPHOS 67 09/19/2015   AST 20 09/19/2015   ALT 10* 09/19/2015  .   Total Time in preparing paper work, data evaluation and todays exam - 35 minutes  Rajveer Handler M.D on 09/22/2015 at 12:41 PM  [facility]   Office  [phone]

## 2015-09-22 NOTE — Care Management Note (Signed)
Case Management Note  Patient Details  Name: Sierra KalesShelby J Singleton MRN: [id number] Date of Birth: 1933-02-11  Subjective/Objective:    Pt is from [facility], CSW is following.                            Expected Discharge Plan:  Skilled Nursing Facility  In-House Referral:  Clinical Social Work  Status of Service:  Completed, signed off  Magdalene RiverMayo, Telia Amundson T, [place]RN 09/22/2015, 11:49 AM

## 2015-09-22 NOTE — Progress Notes (Deleted)
Patient Demographics  Sierra Singleton, is a 81 y.o. female, DOB - 03/27/1933, MWU:[id number]  Admit date - 09/19/2015   Admitting Physician Alberteen Samhristopher P Danford, MD  Outpatient Primary MD for the patient is VYAS,DHRUV B., MD  LOS - 3   Chief Complaint  Patient presents with  . Hypotension         Subjective:   Sierra HousekeeperShelby Singleton today has, No headache, No chest pain, No abdominal pain - No Nausea, No new weakness tingling or numbness, No Cough - SOB.   Assessment & Plan    Principal Problem:   Atrial fibrillation with RVR (HCC) Active Problems:   COPD (chronic obstructive pulmonary disease) (HCC)   Hypothyroidism   Atrial fibrillation (HCC)   Chronic diastolic CHF (congestive heart failure) (HCC)   Protein-calorie malnutrition (HCC)   CKD (chronic kidney disease)   Acute on chronic respiratory failure (HCC)   Pressure ulcer  Atrial fibrillation with RVR:  - Rate uncontrolled on presentation, acquiring initially Cardizem drip,  on metoprolol and Cardizem at home , off cardizem drip ,  overnight, heart rate remains in the low 100s, Cardizem was increased from 120 twice a day to 180 twice a day yesterday, metoprolol increased from 25 mg twice a day to 50 mg twice a day yesterday, it increased to 75 mg twice a day today, requested cardiology assistance with medication management for A. fib with RVR. -  She has CHADS2Vasc 5, on warfarin , dosed by pharmacy. - Monitor potassium and magnesium and replete.   Fluid overload, acute diastolic CHF:  - Last EF in March was 60-65%. The patient was given boluses of fluids on arrival to the ER, developed volume overload and then required Bipap and furosemide. - Required BiPAP on admission, no further need since - on  home dose metolazone and Lasix, currently appears euvolemic.   Acute on chronic hypoxic respiratory failure - patient  is on 2 L nasal  cannula at baseline, required BiPAP overnight for volume overload, improved after diuresis, back to baseline.   COPD on home O2:  - No active wheezing, continue with nebs as needed  Acute delerium - required 1 dose of IM haloperidol. - Improving  CKD III:  Stable.  Supratherapeutic INR:  - improved with vitamin K.  Depression and chronic pain:  -Cotninue home vilazodone, hydrocodone, fentanyl, hydrocodone, and divalproex  Lung nodule - Workup as an outpatient, has upcoming PET scans  [name] - Wound consult appreciated. - Already on minocycline and methotrexate as an outpatient(currently methotrexate is on hold), will start on miconazole and Bactroban cream .  Pressure ulcers - Wound care consult appreciated  Hypokalemia, hypomagnesemia - repleted, recheck in am.  Code Status: DO NOT RESUSCITATE  Family Communication: None at bedside  Disposition Plan: Back to SNF once stable   Procedures  None   Consults   None   Medications  Scheduled Meds: . antiseptic oral rinse  7 mL Mouth Rinse BID  . atorvastatin  20 mg Oral q1800  . calcitonin (salmon)  1 spray Alternating Nares Daily  . Chlorhexidine Gluconate Cloth  6 each Topical Q0600  . diltiazem  180 mg Oral BID  . diltiazem  10 mg Intravenous Once  . divalproex  125  mg Oral BID  . feeding supplement (ENSURE ENLIVE)  237 mL Oral BID BM  . [START ON 09/23/2015] fentaNYL  12.5 mcg Transdermal Q72H  . furosemide  40 mg Oral Daily  . HYDROcodone-acetaminophen  1 tablet Oral TID  . ketoconazole   Topical BID  . levothyroxine  125 mcg Oral QAC breakfast  . magnesium sulfate 1 - 4 g bolus IVPB  4 g Intravenous Once  . [START ON 09/25/2015] metolazone  2.5 mg Oral Q Mon  . metoprolol  75 mg Oral BID  . minocycline  100 mg Oral BID  . mupirocin cream   Topical TID  . mupirocin ointment  1 application Nasal BID  . potassium chloride  40 mEq Oral Q2H  . sodium chloride  3 mL Intravenous Q12H    . Vilazodone HCl  20 mg Oral Daily  . Warfarin - Pharmacist Dosing Inpatient   Does not apply q1800   Continuous Infusions: . diltiazem (CARDIZEM) infusion 12.5 mg/hr (09/20/15 0056)   PRN Meds:.acetaminophen, HYDROcodone-acetaminophen, levalbuterol  DVT Prophylaxis  on warfarin  Lab Results  Component Value Date   PLT 164 09/21/2015    Antibiotics    Anti-infectives    Start     Dose/Rate Route Frequency Ordered Stop   09/20/15 1000  fluconazole (DIFLUCAN) tablet 150 mg  Status:  Discontinued     150 mg Oral Daily 09/20/15 0823 09/20/15 1058   09/20/15 0100  minocycline (MINOCIN,DYNACIN) capsule 100 mg     100 mg Oral 2 times daily 09/20/15 0055            Objective:   Filed Vitals:   09/22/15 0400 09/22/15 0841 09/22/15 0844 09/22/15 1039  BP: 125/67 131/51 131/51 131/51  Pulse: 60  104   Temp: 98.8 F (37.1 C) 98.8 F (37.1 C)    TempSrc: Oral Oral    Resp: 17     Height:      Weight:      SpO2: 100%       Wt Readings from Last 3 Encounters:  09/19/15 72.576 kg (160 lb)  09/11/15 72.576 kg (160 lb)  07/04/15 76.204 kg (168 lb)     Intake/Output Summary (Last 24 hours) at 09/22/15 1053 Last data filed at 09/22/15 0900  Gross per 24 hour  Intake     60 ml  Output   1250 ml  Net  -1190 ml     Physical Exam  Awake Alert,  [place].AT,[name] Supple Neck,No JVD,  Symmetrical Chest wall movement, Good air movement bilaterally,  Irregular, tachycardic,No Gallops,Rubs or new Murmurs, No Parasternal Heave +ve B.Sounds, Abd Soft, No tenderness, No organomegaly appriciated,  No Cyanosis, Clubbing or edema, patient has areas of moist skin loss below breast and groin area secondary to Hailey Hailey disease.   Data Review   Micro Results Recent Results (from the past 240 hour(s))  Urine culture     Status: None   Collection Time: 09/19/15  5:30 PM  Result Value Ref Range Status   Specimen Description URINE, CATHETERIZED  Final   Special Requests NONE   Final   Culture MULTIPLE SPECIES PRESENT, SUGGEST RECOLLECTION  Final   Report Status 09/20/2015 FINAL  Final  Blood Culture (routine x 2)     Status: None (Preliminary result)   Collection Time: 09/19/15  6:28 PM  Result Value Ref Range Status   Specimen Description BLOOD LEFT ARM  Final   Special Requests IN PEDIATRIC BOTTLE 1CC  Final  Culture NO GROWTH 2 DAYS  Final   Report Status PENDING  Incomplete  MRSA PCR Screening     Status: Abnormal   Collection Time: 09/20/15  1:42 AM  Result Value Ref Range Status   MRSA by PCR POSITIVE (A) NEGATIVE Final    Comment:        The GeneXpert MRSA Assay (FDA approved for NASAL specimens only), is one component of a comprehensive MRSA colonization surveillance program. It is not intended to diagnose MRSA infection nor to guide or monitor treatment for MRSA infections. RESULT CALLED TO, READ BACK BY AND VERIFIED WITH: M INGRAM@0415  09/20/15 MKELLY   Blood Culture (routine x 2)     Status: None (Preliminary result)   Collection Time: 09/20/15  3:08 AM  Result Value Ref Range Status   Specimen Description BLOOD LEFT ANTECUBITAL  Final   Special Requests BOTTLES DRAWN AEROBIC ONLY 4CC  Final   Culture NO GROWTH 1 DAY  Final   Report Status PENDING  Incomplete    Radiology Reports Dg Pelvis 1-2 Views  09/19/2015  CLINICAL DATA:  Fall with injury.  Initial encounter. EXAM: PELVIS - 1-2 VIEW COMPARISON:  CT of the pelvis on 04/08/2015 and 03/23/2015 FINDINGS: Displaced right-sided superior and inferior pubic rami fractures show partial healing and residual deformity. These were more acute in appearance by CT in April and May. No acute fracture or diastasis identified. Hip joints appear intact with mild osteoarthritis present bilaterally. IMPRESSION: Healing displaced fractures of the right superior and inferior pubic rami with residual deformity. No acute injuries identified. Electronically Signed   By: Irish Lack M.D.   On: 09/19/2015  16:44   Ct Head Wo Contrast  09/19/2015  CLINICAL DATA:  81 year old with a fall last week. Knot on the back of the head. Altered mental status. EXAM: CT HEAD WITHOUT CONTRAST TECHNIQUE: Contiguous axial images were obtained from the base of the skull through the vertex without contrast. COMPARISON:  03/22/2015 FINDINGS: Stable cerebral and cerebellar atrophy. Stable low-density in the periventricular white matter. No evidence for acute hemorrhage, mass lesion, midline shift, hydrocephalus or large infarct. Mild mucosal thickening in the ethmoid air cells. Otherwise, the paranasal sinuses are clear. No calvarial fracture. Evidence for a small scalp contusion along the right posterior scalp. IMPRESSION: No acute intracranial abnormality. Stable atrophy.  Evidence for chronic small vessel ischemic changes. Electronically Signed   By: Richarda Overlie M.D.   On: 09/19/2015 16:43   Dg Chest Port 1 View  09/19/2015  CLINICAL DATA:  Shortness of breath, confusion.  Lung nodule. EXAM: PORTABLE CHEST - 1 VIEW COMPARISON:  Earlier film of the same day, and previous FINDINGS: Increase in bilateral interstitial edema or infiltrates. Right lower lobe nodule as described previously. Heart size upper limits normal for technique. Atheromatous aorta. No pneumothorax. No definite effusion. Visualized skeletal structures are unremarkable. IMPRESSION: 1. Increase in bilateral interstitial edema or infiltrates since earlier film. Electronically Signed   By: Corlis Leak M.D.   On: 09/19/2015 21:11   Dg Chest Port 1 View  09/19/2015  CLINICAL DATA:  Altered mental status.  Unwitnessed fall 1 week ago. EXAM: PORTABLE CHEST 1 VIEW COMPARISON:  02/18/2015 FINDINGS: COPD with chronic hyperinflation and apical emphysematous change. Indistinct opacity at the right base is noted, presumably persistence of the nodular lesion described 06/26/2015. Suspect small left pleural effusion. Borderline cardiomegaly for technique. Negative aortic and  hilar contours. Remote proximal right humerus fracture. No acute osseous finding. IMPRESSION: 1. Probable trace  left pleural effusion.  No pneumonia or edema. 2. Density at the right base correlating with suspicious nodule on chest CT 06/26/2015. Reference CT report. 3. COPD. Electronically Signed   By: Marnee Spring M.D.   On: 09/19/2015 16:45     CBC  Recent Labs Lab 09/19/15 1634 09/20/15 0308 09/21/15 0304  WBC 4.1 3.9* 5.0  HGB 8.9* 8.1* 9.1*  HCT 28.5* 25.9* 30.0*  PLT 148* 140* 164  MCV 97.9 97.7 99.3  MCH 30.6 30.6 30.1  MCHC 31.2 31.3 30.3  RDW 16.7* 16.5* 16.5*    Chemistries   Recent Labs Lab 09/19/15 1634 09/20/15 0308 09/21/15 0304 09/22/15 0530  NA 143 141 141 138  K 4.4 3.8 3.4* 3.2*  CL 99* 97* 96* 89*  CO2 37* 30 34* 36*  GLUCOSE 95 101* 96 82  BUN 35* 25* 24* 17  CREATININE 1.30* 1.10* 1.12* 1.15*  CALCIUM 9.0 8.5* 8.6* 8.4*  MG  --   --  1.2* 1.5*  AST 20  --   --   --   ALT 10*  --   --   --   ALKPHOS 67  --   --   --   BILITOT 0.5  --   --   --    ------------------------------------------------------------------------------------------------------------------ estimated creatinine clearance is 38.1 mL/min (by C-G formula based on Cr of 1.15). ------------------------------------------------------------------------------------------------------------------ No results for input(s): HGBA1C in the last 72 hours. ------------------------------------------------------------------------------------------------------------------ No results for input(s): CHOL, HDL, LDLCALC, TRIG, CHOLHDL, LDLDIRECT in the last 72 hours. ------------------------------------------------------------------------------------------------------------------  Recent Labs  09/20/15 0555  TSH 0.567   ------------------------------------------------------------------------------------------------------------------ No results for input(s): VITAMINB12, FOLATE, FERRITIN, TIBC,  IRON, RETICCTPCT in the last 72 hours.  Coagulation profile  Recent Labs Lab 09/19/15 1634 09/20/15 0308 09/21/15 0304 09/22/15 0530  INR 5.20* 5.94* 2.79* 1.57*    No results for input(s): DDIMER in the last 72 hours.  Cardiac Enzymes  Recent Labs Lab 09/19/15 1634  TROPONINI 0.03   ------------------------------------------------------------------------------------------------------------------ Invalid input(s): POCBNP     Time Spent in minutes   40 minutes   Bastian Andreoli M.D on 09/22/2015 at 10:53 AM  Between 7am to 7pm - Pager - [phone]  After 7pm go to [email or web address] - password [facility]  [facility]   Office  [phone]

## 2015-09-22 NOTE — NC FL2 (Signed)
[place] MEDICAID FL2 LEVEL OF CARE SCREENING TOOL     IDENTIFICATION  Patient Name: Sierra Singleton Birthdate: 08/03/1933 Sex: female Admission Date (Current Location): 09/19/2015  [facility] and [place] Number: Producer, television/film/video and Address:  [facility] [place], [address] [place], [place] [address]      Provider Number: [id number]  Attending Physician Name and Address:  Starleen Arms, MD  Relative Name and Phone Number:       Current Level of Care: Hospital Recommended Level of Care: Skilled Nursing Facility Prior Approval Number:    Date Approved/Denied:   PASRR Number:    Discharge Plan: SNF    Current Diagnoses: Patient Active Problem List   Diagnosis Date Noted  . Pressure ulcer 09/21/2015  . Acute on chronic respiratory failure (HCC) 09/20/2015  . Atrial fibrillation with RVR (HCC) 09/19/2015  . Lung mass 04/18/2015  . Symptomatic anemia 04/05/2015  . Pubic ramus fracture (HCC) 03/23/2015  . Fracture of multiple pubic rami (HCC) 03/23/2015  . Chronic diastolic CHF (congestive heart failure) (HCC) 03/23/2015  . Protein-calorie malnutrition (HCC) 03/23/2015  . CKD (chronic kidney disease) 03/23/2015  . 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 Atrial fibrillation (HCC) 02/11/2015  . Chronic respiratory failure with hypoxia (HCC) 02/11/2015  . Macrocytic anemia 02/09/2015  . Hypothyroidism 02/09/2015  . COPD (chronic obstructive pulmonary disease) (HCC) 12/29/2014    Orientation ACTIVITIES/SOCIAL BLADDER RESPIRATION    Self    Continent O2 (As needed) (3L)  BEHAVIORAL SYMPTOMS/MOOD NEUROLOGICAL BOWEL NUTRITION STATUS      Incontinent Diet (cardiac)  PHYSICIAN VISITS COMMUNICATION OF NEEDS Height & Weight Skin    Verbally   160 lbs. PU Stage and Appropriate Care (deep tissue injury)          AMBULATORY STATUS RESPIRATION    Assist extensive O2 (As needed) (3L)      Personal Care Assistance Level of Assistance  Bathing,  Feeding, Dressing Bathing Assistance: Maximum assistance Feeding assistance: Limited assistance Dressing Assistance: Maximum assistance      Functional Limitations Info                SPECIAL CARE FACTORS FREQUENCY                      Additional Factors Info  Psychotropic, Code Status, Isolation Precautions Code Status Info: DNR   Psychotropic Info: depakote   Isolation Precautions Info: MRSA     Current Medications (09/22/2015): Current Facility-Administered Medications  Medication Dose Route Frequency Provider Last Rate Last Dose  . acetaminophen (TYLENOL) tablet 650 mg  650 mg Oral Q8H PRN Alberteen Sam, MD      . antiseptic oral rinse (CPC / CETYLPYRIDINIUM CHLORIDE 0.05%) solution 7 mL  7 mL Mouth Rinse BID Alberteen Sam, MD   7 mL at 09/21/15 2223  . atorvastatin (LIPITOR) tablet 20 mg  20 mg Oral q1800 Alberteen Sam, MD   20 mg at 09/21/15 1824  . calcitonin (salmon) (MIACALCIN/FORTICAL) nasal spray 1 spray  1 spray Alternating Nares Daily Alberteen Sam, MD   1 spray at 09/20/15 0956  . Chlorhexidine Gluconate Cloth 2 % PADS 6 each  6 each Topical Q0600 Alberteen Sam, MD      . diltiazem (CARDIZEM CD) 24 hr capsule 180 mg  180 mg Oral BID Starleen Arms, MD   180 mg at 09/21/15 2222  . diltiazem (CARDIZEM) 100 mg in dextrose 5 % 100 mL (  1 mg/mL) infusion  5-15 mg/hr Intravenous Titrated Alberteen Sam, MD 12.5 mL/hr at 09/20/15 0056 12.5 mg/hr at 09/20/15 0056  . diltiazem (CARDIZEM) injection 10 mg  10 mg Intravenous Once Starleen Arms, MD   10 mg at 09/21/15 0944  . divalproex (DEPAKOTE SPRINKLE) capsule 125 mg  125 mg Oral BID Alberteen Sam, MD   125 mg at 09/21/15 2221  . feeding supplement (ENSURE ENLIVE) (ENSURE ENLIVE) liquid 237 mL  237 mL Oral BID BM Ailene Ards, RD   237 mL at 09/21/15 1400  . [START ON 09/23/2015] fentaNYL (DURAGESIC - dosed mcg/hr) 12.5 mcg  12.5 mcg Transdermal  Q72H Almon Hercules, RPH      . furosemide (LASIX) tablet 40 mg  40 mg Oral Daily Alberteen Sam, MD   40 mg at 09/21/15 1220  . HYDROcodone-acetaminophen (NORCO/VICODIN) 5-325 MG per tablet 1 tablet  1 tablet Oral Q4H PRN Alberteen Sam, MD   1 tablet at 09/20/15 0149  . HYDROcodone-acetaminophen (NORCO/VICODIN) 5-325 MG per tablet 1 tablet  1 tablet Oral TID Alberteen Sam, MD   1 tablet at 09/21/15 2221  . ketoconazole (NIZORAL) 2 % cream   Topical BID Starleen Arms, MD      . levalbuterol [facility]) nebulizer solution 0.63 mg  0.63 mg Nebulization Q6H PRN Alberteen Sam, MD   0.63 mg at 09/20/15 1252  . levothyroxine (SYNTHROID, LEVOTHROID) tablet 125 mcg  125 mcg Oral QAC breakfast Alberteen Sam, MD   125 mcg at 09/22/15 0829  . [START ON 09/25/2015] metolazone (ZAROXOLYN) tablet 2.5 mg  2.5 mg Oral Q Mon Christopher Lamonte Sakai, MD      . metoprolol tartrate (LOPRESSOR) tablet 75 mg  75 mg Oral BID Starleen Arms, MD   75 mg at 09/22/15 0844  . minocycline (MINOCIN,DYNACIN) capsule 100 mg  100 mg Oral BID Alberteen Sam, MD   100 mg at 09/21/15 2221  . mupirocin cream (BACTROBAN) 2 %   Topical TID Starleen Arms, MD      . mupirocin ointment (BACTROBAN) 2 % 1 application  1 application Nasal BID Alberteen Sam, MD   1 application at 09/21/15 1826  . sodium chloride 0.9 % injection 3 mL  3 mL Intravenous Q12H Alberteen Sam, MD   3 mL at 09/21/15 2226  . Vilazodone HCl TABS 20 mg  20 mg Oral Daily Alberteen Sam, MD   20 mg at 09/21/15 1220  . Warfarin - Pharmacist Dosing Inpatient   Does not apply q1800 Herby Abraham, [facility]       Do not use this list as official medication orders. Please verify with discharge summary.  Discharge Medications:   Medication List    ASK your doctor about these medications        acetaminophen 650 MG CR tablet  Commonly known as:  TYLENOL  Take 650 mg by mouth every 8 (eight) hours as  needed for pain.     atorvastatin 20 MG tablet  Commonly known as:  LIPITOR  Take 20 mg by mouth daily at 6 PM.     calcitonin (salmon) 200 UNIT/ACT nasal spray  Commonly known as:  MIACALCIN/FORTICAL  Place 1 spray into alternate nostrils daily.     cloNIDine 0.1 MG tablet  Commonly known as:  CATAPRES  Take 0.1 mg by mouth 2 (two) times daily as needed (use if SBP > 200).  cyanocobalamin 500 MCG tablet  Place 500 mcg under the tongue daily.     diltiazem 120 MG 24 hr capsule  Commonly known as:  CARDIZEM CD  Take 120 mg by mouth 2 (two) times daily.     divalproex 125 MG capsule  Commonly known as:  DEPAKOTE SPRINKLE  Take 125 mg by mouth 2 (two) times daily.     fentaNYL 12 MCG/HR  Commonly known as:  DURAGESIC - dosed mcg/hr  Place 12.5 mcg onto the skin every 3 (three) days.     ferrous sulfate 325 (65 FE) MG tablet  Take 325 mg by mouth daily.     folic acid 1 MG tablet  Commonly known as:  FOLVITE  Take 1 mg by mouth daily.     furosemide 40 MG tablet  Commonly known as:  LASIX  Take 40 mg by mouth daily.     HYDROcodone-acetaminophen 5-325 MG tablet  Commonly known as:  NORCO/VICODIN  Take 1 tablet by mouth 3 (three) times daily.     HYDROcodone-acetaminophen 5-325 MG tablet  Commonly known as:  NORCO/VICODIN  Take 1 tablet by mouth every 4 (four) hours as needed for moderate pain.     hydrOXYzine 25 MG tablet  Commonly known as:  ATARAX/VISTARIL  Take 25 mg by mouth at bedtime.     hydrOXYzine 25 MG tablet  Commonly known as:  ATARAX/VISTARIL  Take 25 mg by mouth 3 (three) times daily as needed for itching.     levalbuterol 0.63 MG/3ML nebulizer solution  Commonly known as:  XOPENEX  Take 3 mLs (0.63 mg total) by nebulization every 6 (six) hours as needed for wheezing or shortness of breath.     levothyroxine 125 MCG tablet  Commonly known as:  SYNTHROID, LEVOTHROID  Take 125 mcg by mouth daily before breakfast.     loratadine 10 MG tablet   Commonly known as:  CLARITIN  Take 10 mg by mouth daily.     LORazepam 1 MG tablet  Commonly known as:  ATIVAN  Take 1 mg by mouth every 12 (twelve) hours as needed for anxiety.     methotrexate 2.5 MG tablet  Take 10 mg by mouth once a week. On Sunday.     metolazone 2.5 MG tablet  Commonly known as:  ZAROXOLYN  Take 2.5 mg by mouth every Monday.     metoprolol tartrate 25 MG tablet  Commonly known as:  LOPRESSOR  Take 1 tablet (25 mg total) by mouth 2 (two) times daily.     minocycline 100 MG capsule  Commonly known as:  MINOCIN,DYNACIN  Take 100 mg by mouth 2 (two) times daily.     OXYGEN  Inhale 2 L into the lungs continuous.     potassium chloride SA 20 MEQ tablet  Commonly known as:  K-DUR,KLOR-CON  Take 20 mEq by mouth 3 (three) times daily.     traZODone 100 MG tablet  Commonly known as:  DESYREL  Take 100 mg by mouth at bedtime.     VIIBRYD 20 MG Tabs  Generic drug:  Vilazodone HCl  Take 20 mg by mouth daily.     Vitamin D (Ergocalciferol) 50000 UNITS Caps capsule  Commonly known as:  DRISDOL  Take 50,000 Units by mouth every 7 (seven) days. Take on thursdays     warfarin 5 MG tablet  Commonly known as:  COUMADIN  Take 5 mg by mouth every Monday, Wednesday, and Friday at 6 PM. Take 1  tablet every Monday, Wednesday, and Friday     warfarin 2.5 MG tablet  Commonly known as:  COUMADIN  Take 2.5 mg by mouth every Tuesday, Thursday, Saturday, and Sunday at 6 PM.        Relevant Imaging Results:  Relevant Lab Results:  Recent Labs    Additional Information    [place], Binnie RailJenna M, LCSW

## 2015-09-22 NOTE — Progress Notes (Signed)
[facility] for [facility] today [facility] Joice LoftsAnnette Jones, pt alert and oriented,VSS.

## 2015-09-22 NOTE — Consult Note (Signed)
CARDIOLOGY CONSULT NOTE   Patient ID: Sierra Singleton MRN: [id number], DOB/AGE: 1933-06-19   Admit date: 09/19/2015 Date of Consult: 09/22/2015   Primary Physician: Ignatius SpeckingVYAS,DHRUV B., MD Primary Cardiologist: Dr. Verdis PrimeHenry Smith  Pt. Profile  81 year old Caucasian female with past medical history of persistent atrial fibrillation on coumadin, diastolic heart failure, COPD on home O2, chronic anemia, stage III CKD, and hypothyroidism presented with afib with RVR and hypotension. She was given IV fluid and subsequently went into acute on chronic diastolic HF.   Problem List  Past Medical History  Diagnosis Date  . Rash     Zebedee IbaHaley Haley rash  . Anxiety   . Thyroid disease   . Hyperlipidemia   . Hypertension   . COPD (chronic obstructive pulmonary disease) (HCC)   . Poor historian   . Carotid artery disease (HCC)   . Atrial fibrillation (HCC) 02/11/2015  . Chronic respiratory failure with hypoxia (HCC) 02/11/2015  . 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 Collagen vascular disease (HCC)   . CHF (congestive heart failure) [facility](HCC)     Past Surgical History  Procedure Laterality Date  . Back surgery       Allergies  Allergies  Allergen Reactions  . Ambien [Zolpidem Tartrate]     unknown  . Sulfa Antibiotics Nausea And Vomiting  . Clindamycin/Lincomycin Swelling and Rash    HPI   The patient is a 81 year old Caucasian female with past medical history of persistent atrial fibrillation on coumadin, diastolic heart failure, COPD on home O2, chronic anemia, stage III CKD, and hypothyroidism. She was last seen in the clinic on 07/04/2015 at which time she was 168 pound. She was placed on Lasix, metolazone 3 times per week, and Aldactone with good diuresis. Her lower extremity edema has completely resolved. It is unknown how long she stays in atrial fibrillation at one time, as her last EKG in May 2016 showed possible sinus rhythm, although p wave was difficult to see. I did not see any attempt  at cardioverting her since May.  [facility] with hypotension and brief altered mental status. On arrival she was noted to be A. fib with RVR but normotensive and saturating well on home O2. She was hydrated in the ED, and subsequently went into acute on chronic diastolic heart failure requiring BiPAP. She was admitted to the internal medicine service for rate control and diuresis. She is now euvolemic and back on previous 40 mg daily of Lasix. As for her rapid heart rate, her metoprolol was increased from 25 mg twice a day to 75 mg twice a day. Her diltiazem was also increased from 120 mg twice a day to 160 mg twice a day. Her heart rate is much better controlled. Cardiology has been consulted for atrial fibrillation.  She is currently confused, has no idea who Dr. Katrinka BlazingSmith (her primary cardiologist) is. Believe the year is [id number] and there is currently no president in [place]. Although she is clear she is in hospital. Majority of HPI obtained from medical record.   Inpatient Medications  . antiseptic oral rinse  7 mL Mouth Rinse BID  . atorvastatin  20 mg Oral q1800  . calcitonin (salmon)  1 spray Alternating Nares Daily  . Chlorhexidine Gluconate Cloth  6 each Topical Q0600  . diltiazem  180 mg Oral BID  . diltiazem  10 mg Intravenous Once  . divalproex  125 mg Oral BID  . feeding supplement (ENSURE ENLIVE)  237 mL  Oral BID BM  . [START ON 09/23/2015] fentaNYL  12.5 mcg Transdermal Q72H  . furosemide  40 mg Oral Daily  . HYDROcodone-acetaminophen  1 tablet Oral TID  . ketoconazole   Topical BID  . levothyroxine  125 mcg Oral QAC breakfast  . [START ON 09/25/2015] metolazone  2.5 mg Oral Q Mon  . metoprolol  75 mg Oral BID  . minocycline  100 mg Oral BID  . mupirocin cream   Topical TID  . mupirocin ointment  1 application Nasal BID  . sodium chloride  3 mL Intravenous Q12H  . Vilazodone HCl  20 mg Oral Daily  . Warfarin - Pharmacist Dosing Inpatient    Does not apply q1800    Family History Family History  Problem Relation Age of Onset  . Stroke Father   . Cancer Mother   . COPD Sister   . Emphysema Sister   . Other Brother     BRAIN TUMOR  . Colon cancer Brother      Social History Social History   Social History  . Marital Status: Divorced    Spouse Name: N/A  . Number of Children: N/A  . Years of Education: N/A   Occupational History  . Not on file.   Social History Main Topics  . Smoking status: Former Games developer  . Smokeless tobacco: Not on file  . Alcohol Use: No  . Drug Use: No  . Sexual Activity: No   Other Topics Concern  . Not on file   Social History Narrative     Review of Systems  Patient is a poor historian, pleasantly confused. Denies any fever, chill, CP, SOB. Unable to collect some ROS  Physical Exam  Blood pressure 131/51, pulse 104, temperature 98.8 F (37.1 C), temperature source Oral, resp. rate 17, height  (1.626 m), weight 160 lb (72.576 kg), SpO2 100 %.  General: Pleasantly confused. Frail Psych: Normal affect. Neuro: Alert and oriented X 1 (clear she is in hospital, not sure how she got here, year is 59s, currently there is no president in [place]). Moves all extremities spontaneously. HEENT: Normal  Neck: Supple without bruits or JVD. Lungs:  Resp regular and unlabored, anterior exam CTA. Unable to sit up or lean on the side due to frailty Heart: irregular. no s3, s4, or murmurs. Abdomen: Soft, non-tender, non-distended, BS + x 4.  Extremities: No clubbing, cyanosis or edema. DP/PT/Radials 2+ and equal bilaterally.  Labs   Recent Labs  09/19/15 1634  TROPONINI 0.03   Lab Results  Component Value Date   WBC 5.0 09/21/2015   HGB 9.1* 09/21/2015   HCT 30.0* 09/21/2015   MCV 99.3 09/21/2015   PLT 164 09/21/2015    Recent Labs Lab 09/19/15 1634  09/22/15 0530  NA 143  < > 138  K 4.4  < > 3.2*  CL 99*  < > 89*  CO2 37*  < > 36*  BUN 35*  < > 17  CREATININE 1.30*  <  > 1.15*  CALCIUM 9.0  < > 8.4*  PROT 5.7*  --   --   BILITOT 0.5  --   --   ALKPHOS 67  --   --   ALT 10*  --   --   AST 20  --   --   GLUCOSE 95  < > 82  < > = values in this interval not displayed. Lab Results  Component Value Date   TRIG 121 02/09/2015  No results found for: DDIMER  Radiology/Studies  Dg Pelvis 1-2 Views  09/19/2015  CLINICAL DATA:  Fall with injury.  Initial encounter. EXAM: PELVIS - 1-2 VIEW COMPARISON:  CT of the pelvis on 04/08/2015 and 03/23/2015 FINDINGS: Displaced right-sided superior and inferior pubic rami fractures show partial healing and residual deformity. These were more acute in appearance by CT in April and May. No acute fracture or diastasis identified. Hip joints appear intact with mild osteoarthritis present bilaterally. IMPRESSION: Healing displaced fractures of the right superior and inferior pubic rami with residual deformity. No acute injuries identified. Electronically Signed   By: Irish Lack M.D.   On: 09/19/2015 16:44   Ct Head Wo Contrast  09/19/2015  CLINICAL DATA:  81 year old with a fall last week. Knot on the back of the head. Altered mental status. EXAM: CT HEAD WITHOUT CONTRAST TECHNIQUE: Contiguous axial images were obtained from the base of the skull through the vertex without contrast. COMPARISON:  03/22/2015 FINDINGS: Stable cerebral and cerebellar atrophy. Stable low-density in the periventricular white matter. No evidence for acute hemorrhage, mass lesion, midline shift, hydrocephalus or large infarct. Mild mucosal thickening in the ethmoid air cells. Otherwise, the paranasal sinuses are clear. No calvarial fracture. Evidence for a small scalp contusion along the right posterior scalp. IMPRESSION: No acute intracranial abnormality. Stable atrophy.  Evidence for chronic small vessel ischemic changes. Electronically Signed   By: Richarda Overlie M.D.   On: 09/19/2015 16:43   Dg Chest Port 1 View  09/19/2015  CLINICAL DATA:  Shortness  of breath, confusion.  Lung nodule. EXAM: PORTABLE CHEST - 1 VIEW COMPARISON:  Earlier film of the same day, and previous FINDINGS: Increase in bilateral interstitial edema or infiltrates. Right lower lobe nodule as described previously. Heart size upper limits normal for technique. Atheromatous aorta. No pneumothorax. No definite effusion. Visualized skeletal structures are unremarkable. IMPRESSION: 1. Increase in bilateral interstitial edema or infiltrates since earlier film. Electronically Signed   By: Corlis Leak M.D.   On: 09/19/2015 21:11   Dg Chest Port 1 View  09/19/2015  CLINICAL DATA:  Altered mental status.  Unwitnessed fall 1 week ago. EXAM: PORTABLE CHEST 1 VIEW COMPARISON:  02/18/2015 FINDINGS: COPD with chronic hyperinflation and apical emphysematous change. Indistinct opacity at the right base is noted, presumably persistence of the nodular lesion described 06/26/2015. Suspect small left pleural effusion. Borderline cardiomegaly for technique. Negative aortic and hilar contours. Remote proximal right humerus fracture. No acute osseous finding. IMPRESSION: 1. Probable trace left pleural effusion.  No pneumonia or edema. 2. Density at the right base correlating with suspicious nodule on chest CT 06/26/2015. Reference CT report. 3. COPD. Electronically Signed   By: Marnee Spring M.D.   On: 09/19/2015 16:45    ECG  EKG 09/19/2015 afib with HR 108  ASSESSMENT AND PLAN  1. Persistent atrial fibrillation on coumadin  - CHA2DS2-VASc score 5 (age, female, HF, HTN), on coumadin  - metoprolol increased to  BID and diltiazem increased to  BID.   - currently rate controlled, no need to adjust medication further  - Echo 02/10/2015 EF 60-65%, grade 2 diastolic dysfunction, moderately to severely dilated LA. Unlikely to stay in NSR long term. No plan for cardioversion  2. Acute on chronic diastolic HF  - euvolemic now. Back on home dose of lasix  - weight down from previous 168 lbs on  office visit 06/2015 to now 160lbs which will serve as new dry weight  3. COPD on home O2  4. chronic anemia 5. stage III CKD 6. Hypothyroidism 7. Dementia   Signed, Azalee Course, PA-C 09/22/2015, 76:64 AM   81 year old frail appearing female with COPD on  Home O2, with persistent a-fib and chronic diastolic CHF was admitted with a-fib with RVR and acute on chronic diastolic CHF.  She is on chronic anticoagulation with coumadin, INR 2.8. Agree with increased metoprolol and diltiazem, currently rate controlled. Long term plan is rate control.  She responded well to iv Lasix , now on dome PO dose at her dry weight with some crackles at her lung bases, we will continue to monitor.  Lars Masson 09/22/2015

## 2015-09-22 NOTE — Progress Notes (Signed)
ANTICOAGULATION CONSULT NOTE - Follow Up Consult  Pharmacy Consult for Coumadin Indication: atrial fibrillation  Allergies  Allergen Reactions  . Ambien [Zolpidem Tartrate]     unknown  . Sulfa Antibiotics Nausea And Vomiting  . Clindamycin/Lincomycin Swelling and Rash    Patient Measurements: Height: 5\' 4"  (162.6 cm) Weight: 160 lb (72.576 kg) IBW/kg (Calculated) : 54.7  Vital Signs: Temp: 97.6 F (36.4 C) (10/28 1245) Temp Source: Oral (10/28 1245) BP: 120/85 mmHg (10/28 1200) Pulse Rate: 72 (10/28 1200)  Labs:  Recent Labs  09/19/15 1634 09/20/15 0308 09/21/15 0304 09/22/15 0530  HGB 8.9* 8.1* 9.1*  --   HCT 28.5* 25.9* 30.0*  --   PLT 148* 140* 164  --   LABPROT 45.6* 51.1* 29.0* 18.8*  INR 5.20* 5.94* 2.79* 1.57*  CREATININE 1.30* 1.10* 1.12* 1.15*  TROPONINI 0.03  --   --   --     Estimated Creatinine Clearance: 38.1 mL/min (by C-G formula based on Cr of 1.15).   Assessment:  Anticoagulation Coum PTA for afib; (5 mg MWF 2.5 mg TTSS) with admit INR 5.2. INR 2.79>1.57 s/p Vit K 2.5mg  po (10/27);  Hg 8.1>9.1, pltc 140>164 . Hematomas to back of head from fall at [facility] last Wednesday; head CT neg. CHADS2Vasc 5  Goal of Therapy:  INR 2-3 Monitor platelets by anticoagulation protocol: Yes   Plan:  Coumadin 4mg  po x 1 today to try and reverse Vitamin K. Daily INR   Sierra Singleton, PharmD, BCPS Clinical Staff Pharmacist Pager [phone]  Sierra Singleton, Sierra Singleton 09/22/2015,1:23 PM

## 2015-09-23 ENCOUNTER — Encounter (HOSPITAL_COMMUNITY): Payer: Self-pay | Admitting: Emergency Medicine

## 2015-09-23 ENCOUNTER — Emergency Department (HOSPITAL_COMMUNITY): Payer: Medicare Other

## 2015-09-23 ENCOUNTER — Inpatient Hospital Stay (HOSPITAL_COMMUNITY)
Admission: EM | Admit: 2015-09-23 | Discharge: 2015-09-28 | DRG: 291 | Disposition: A | Discharge status: Skilled Nursing Facility | Payer: Medicare Other | Attending: Internal Medicine | Admitting: Internal Medicine

## 2015-09-23 DIAGNOSIS — D539 Nutritional anemia, unspecified: Secondary | ICD-10-CM | POA: Diagnosis not present

## 2015-09-23 DIAGNOSIS — Z888 Allergy status to other drugs, medicaments and biological substances status: Secondary | ICD-10-CM | POA: Diagnosis not present

## 2015-09-23 DIAGNOSIS — Z515 Encounter for palliative care: Secondary | ICD-10-CM | POA: Diagnosis not present

## 2015-09-23 DIAGNOSIS — Z79899 Other long term (current) drug therapy: Secondary | ICD-10-CM | POA: Diagnosis not present

## 2015-09-23 DIAGNOSIS — J441 Chronic obstructive pulmonary disease with (acute) exacerbation: Secondary | ICD-10-CM | POA: Diagnosis present

## 2015-09-23 DIAGNOSIS — E039 Hypothyroidism, unspecified: Secondary | ICD-10-CM | POA: Diagnosis present

## 2015-09-23 DIAGNOSIS — Z825 Family history of asthma and other chronic lower respiratory diseases: Secondary | ICD-10-CM | POA: Diagnosis not present

## 2015-09-23 DIAGNOSIS — Q828 Other specified congenital malformations of skin: Secondary | ICD-10-CM | POA: Diagnosis not present

## 2015-09-23 DIAGNOSIS — R296 Repeated falls: Secondary | ICD-10-CM | POA: Diagnosis present

## 2015-09-23 DIAGNOSIS — W19XXXA Unspecified fall, initial encounter: Secondary | ICD-10-CM | POA: Diagnosis not present

## 2015-09-23 DIAGNOSIS — F0390 Unspecified dementia without behavioral disturbance: Secondary | ICD-10-CM | POA: Diagnosis present

## 2015-09-23 DIAGNOSIS — N179 Acute kidney failure, unspecified: Secondary | ICD-10-CM | POA: Diagnosis present

## 2015-09-23 DIAGNOSIS — I482 Chronic atrial fibrillation: Secondary | ICD-10-CM | POA: Diagnosis present

## 2015-09-23 DIAGNOSIS — I4891 Unspecified atrial fibrillation: Secondary | ICD-10-CM

## 2015-09-23 DIAGNOSIS — Z7901 Long term (current) use of anticoagulants: Secondary | ICD-10-CM

## 2015-09-23 DIAGNOSIS — I5033 Acute on chronic diastolic (congestive) heart failure: Secondary | ICD-10-CM | POA: Diagnosis present

## 2015-09-23 DIAGNOSIS — I429 Cardiomyopathy, unspecified: Secondary | ICD-10-CM | POA: Diagnosis present

## 2015-09-23 DIAGNOSIS — Z9981 Dependence on supplemental oxygen: Secondary | ICD-10-CM

## 2015-09-23 DIAGNOSIS — Z66 Do not resuscitate: Secondary | ICD-10-CM | POA: Diagnosis present

## 2015-09-23 DIAGNOSIS — J9621 Acute and chronic respiratory failure with hypoxia: Secondary | ICD-10-CM | POA: Diagnosis present

## 2015-09-23 DIAGNOSIS — Z881 Allergy status to other antibiotic agents status: Secondary | ICD-10-CM | POA: Diagnosis not present

## 2015-09-23 DIAGNOSIS — J449 Chronic obstructive pulmonary disease, unspecified: Secondary | ICD-10-CM | POA: Diagnosis present

## 2015-09-23 DIAGNOSIS — J9620 Acute and chronic respiratory failure, unspecified whether with hypoxia or hypercapnia: Secondary | ICD-10-CM | POA: Diagnosis not present

## 2015-09-23 DIAGNOSIS — N183 Chronic kidney disease, stage 3 unspecified: Secondary | ICD-10-CM | POA: Diagnosis present

## 2015-09-23 DIAGNOSIS — I5032 Chronic diastolic (congestive) heart failure: Secondary | ICD-10-CM | POA: Diagnosis present

## 2015-09-23 DIAGNOSIS — R918 Other nonspecific abnormal finding of lung field: Secondary | ICD-10-CM | POA: Diagnosis present

## 2015-09-23 DIAGNOSIS — I130 Hypertensive heart and chronic kidney disease with heart failure and stage 1 through stage 4 chronic kidney disease, or unspecified chronic kidney disease: Principal | ICD-10-CM | POA: Diagnosis present

## 2015-09-23 DIAGNOSIS — F419 Anxiety disorder, unspecified: Secondary | ICD-10-CM | POA: Diagnosis not present

## 2015-09-23 DIAGNOSIS — Z87891 Personal history of nicotine dependence: Secondary | ICD-10-CM | POA: Diagnosis not present

## 2015-09-23 DIAGNOSIS — Z7401 Bed confinement status: Secondary | ICD-10-CM | POA: Diagnosis not present

## 2015-09-23 DIAGNOSIS — D692 Other nonthrombocytopenic purpura: Secondary | ICD-10-CM | POA: Diagnosis not present

## 2015-09-23 DIAGNOSIS — Z22322 Carrier or suspected carrier of Methicillin resistant Staphylococcus aureus: Secondary | ICD-10-CM | POA: Diagnosis not present

## 2015-09-23 DIAGNOSIS — R410 Disorientation, unspecified: Secondary | ICD-10-CM | POA: Diagnosis present

## 2015-09-23 DIAGNOSIS — J438 Other emphysema: Secondary | ICD-10-CM | POA: Diagnosis not present

## 2015-09-23 DIAGNOSIS — N189 Chronic kidney disease, unspecified: Secondary | ICD-10-CM | POA: Diagnosis present

## 2015-09-23 DIAGNOSIS — E785 Hyperlipidemia, unspecified: Secondary | ICD-10-CM | POA: Diagnosis present

## 2015-09-23 LAB — CBC WITH DIFFERENTIAL/PLATELET
Basophils Absolute: 0 10*3/uL (ref 0.0–0.1)
Basophils Relative: 0 %
Eosinophils Absolute: 0.1 10*3/uL (ref 0.0–0.7)
Eosinophils Relative: 1 %
HEMATOCRIT: 30 % — AB (ref 36.0–46.0)
HEMOGLOBIN: 9.5 g/dL — AB (ref 12.0–15.0)
LYMPHS ABS: 2.2 10*3/uL (ref 0.7–4.0)
LYMPHS PCT: 27 %
MCH: 30.8 pg (ref 26.0–34.0)
MCHC: 31.7 g/dL (ref 30.0–36.0)
MCV: 97.4 fL (ref 78.0–100.0)
Monocytes Absolute: 0.7 10*3/uL (ref 0.1–1.0)
Monocytes Relative: 9 %
NEUTROS ABS: 5.1 10*3/uL (ref 1.7–7.7)
NEUTROS PCT: 63 %
Platelets: 234 10*3/uL (ref 150–400)
RBC: 3.08 MIL/uL — AB (ref 3.87–5.11)
RDW: 16.1 % — ABNORMAL HIGH (ref 11.5–15.5)
WBC: 8.1 10*3/uL (ref 4.0–10.5)

## 2015-09-23 LAB — BASIC METABOLIC PANEL
Anion gap: 12 (ref 5–15)
BUN: 21 mg/dL — AB (ref 6–20)
CHLORIDE: 90 mmol/L — AB (ref 101–111)
CO2: 35 mmol/L — ABNORMAL HIGH (ref 22–32)
Calcium: 8.7 mg/dL — ABNORMAL LOW (ref 8.9–10.3)
Creatinine, Ser: 1.22 mg/dL — ABNORMAL HIGH (ref 0.44–1.00)
GFR calc Af Amer: 47 mL/min — ABNORMAL LOW (ref >60)
GFR calc non Af Amer: 41 mL/min — ABNORMAL LOW (ref >60)
GLUCOSE: 101 mg/dL — AB (ref 65–99)
POTASSIUM: 4.4 mmol/L (ref 3.5–5.1)
Sodium: 137 mmol/L (ref 135–145)

## 2015-09-23 LAB — URINALYSIS, ROUTINE W REFLEX MICROSCOPIC
BILIRUBIN URINE: NEGATIVE
Glucose, UA: NEGATIVE mg/dL
HGB URINE DIPSTICK: NEGATIVE
Ketones, ur: NEGATIVE mg/dL
NITRITE: NEGATIVE
Protein, ur: 30 mg/dL — AB
Specific Gravity, Urine: 1.022 (ref 1.005–1.030)
UROBILINOGEN UA: 1 mg/dL (ref 0.0–1.0)
pH: 5.5 (ref 5.0–8.0)

## 2015-09-23 LAB — I-STAT TROPONIN, ED: TROPONIN I, POC: 0.01 ng/mL (ref 0.00–0.08)

## 2015-09-23 LAB — PROTIME-INR
INR: 1.73 — ABNORMAL HIGH (ref 0.00–1.49)
Prothrombin Time: 20.3 seconds — ABNORMAL HIGH (ref 11.6–15.2)

## 2015-09-23 LAB — URINE MICROSCOPIC-ADD ON

## 2015-09-23 LAB — INFLUENZA PANEL BY PCR (TYPE A & B)
H1N1 flu by pcr: NOT DETECTED
INFLAPCR: NEGATIVE
INFLBPCR: NEGATIVE

## 2015-09-23 LAB — MAGNESIUM: MAGNESIUM: 2.3 mg/dL (ref 1.7–2.4)

## 2015-09-23 LAB — CK: CK TOTAL: 42 U/L (ref 38–234)

## 2015-09-23 LAB — MRSA PCR SCREENING: MRSA by PCR: POSITIVE — AB

## 2015-09-23 LAB — BRAIN NATRIURETIC PEPTIDE: B Natriuretic Peptide: 451.4 pg/mL — ABNORMAL HIGH (ref 0.0–100.0)

## 2015-09-23 LAB — TROPONIN I: Troponin I: <0.03 ng/mL (ref <0.031)

## 2015-09-23 MED ORDER — SODIUM CHLORIDE 0.9 % IV SOLN
INTRAVENOUS | Status: DC
  Administered 2015-09-23: 16:00:00 via INTRAVENOUS

## 2015-09-23 MED ORDER — FENTANYL 12 MCG/HR TD PT72
12.5000 ug | MEDICATED_PATCH | TRANSDERMAL | Status: DC
  Administered 2015-09-25: 12.5 ug via TRANSDERMAL
  Filled 2015-09-23: qty 1

## 2015-09-23 MED ORDER — BISACODYL 10 MG RE SUPP: 10.0000 mg | Freq: Every day | RECTAL | Status: DC | PRN

## 2015-09-23 MED ORDER — WARFARIN - PHARMACIST DOSING INPATIENT
Freq: Every day | Status: DC
  Administered 2015-09-23: 17:00:00

## 2015-09-23 MED ORDER — ACETAMINOPHEN 325 MG PO TABS
650.0000 mg | ORAL_TABLET | Freq: Four times a day (QID) | ORAL | Status: DC | PRN
  Filled 2015-09-23: qty 2

## 2015-09-23 MED ORDER — ONDANSETRON HCL 4 MG PO TABS: 4.0000 mg | ORAL_TABLET | Freq: Four times a day (QID) | ORAL | Status: DC | PRN

## 2015-09-23 MED ORDER — HYDROXYZINE HCL 25 MG PO TABS
25.0000 mg | ORAL_TABLET | Freq: Three times a day (TID) | ORAL | Status: DC | PRN
Start: 2015-09-23 — End: 2015-09-28
  Administered 2015-09-23 – 2015-09-25 (×2): 25 mg via ORAL
  Filled 2015-09-23 (×5): qty 1

## 2015-09-23 MED ORDER — METOPROLOL TARTRATE 50 MG PO TABS
75.0000 mg | ORAL_TABLET | Freq: Two times a day (BID) | ORAL | Status: DC
  Administered 2015-09-23: 75 mg via ORAL
  Filled 2015-09-23 (×2): qty 1

## 2015-09-23 MED ORDER — FUROSEMIDE 10 MG/ML IJ SOLN
40.0000 mg | Freq: Two times a day (BID) | INTRAMUSCULAR | Status: DC
  Administered 2015-09-24 (×2): 40 mg via INTRAVENOUS
  Filled 2015-09-23 (×2): qty 4

## 2015-09-23 MED ORDER — CHLORHEXIDINE GLUCONATE CLOTH 2 % EX PADS
6.0000 | MEDICATED_PAD | Freq: Every day | CUTANEOUS | Status: AC
  Administered 2015-09-24 – 2015-09-28 (×5): 6 via TOPICAL

## 2015-09-23 MED ORDER — FERROUS SULFATE 325 (65 FE) MG PO TABS
325.0000 mg | ORAL_TABLET | Freq: Every day | ORAL | Status: DC
  Administered 2015-09-23 – 2015-09-28 (×6): 325 mg via ORAL
  Filled 2015-09-23 (×6): qty 1

## 2015-09-23 MED ORDER — IPRATROPIUM-ALBUTEROL 0.5-2.5 (3) MG/3ML IN SOLN: 3.0000 mL | RESPIRATORY_TRACT | Status: DC

## 2015-09-23 MED ORDER — TRAZODONE HCL 100 MG PO TABS
100.0000 mg | ORAL_TABLET | Freq: Every day | ORAL | Status: DC
  Administered 2015-09-23 – 2015-09-27 (×5): 100 mg via ORAL
  Filled 2015-09-23: qty 2
  Filled 2015-09-23: qty 1
  Filled 2015-09-23: qty 2
  Filled 2015-09-23: qty 1
  Filled 2015-09-23 (×2): qty 2

## 2015-09-23 MED ORDER — METHOTREXATE 2.5 MG PO TABS
10.0000 mg | ORAL_TABLET | ORAL | Status: DC
  Administered 2015-09-24: 10 mg via ORAL
  Filled 2015-09-23: qty 4

## 2015-09-23 MED ORDER — LORAZEPAM 1 MG PO TABS
1.0000 mg | ORAL_TABLET | Freq: Two times a day (BID) | ORAL | Status: DC | PRN
  Administered 2015-09-23: 1 mg via ORAL
  Filled 2015-09-23: qty 1

## 2015-09-23 MED ORDER — CLONIDINE HCL 0.1 MG PO TABS: 0.1000 mg | ORAL_TABLET | Freq: Two times a day (BID) | ORAL | Status: DC | PRN

## 2015-09-23 MED ORDER — FOLIC ACID 1 MG PO TABS
1.0000 mg | ORAL_TABLET | Freq: Every day | ORAL | Status: DC
  Administered 2015-09-23 – 2015-09-28 (×6): 1 mg via ORAL
  Filled 2015-09-23 (×6): qty 1

## 2015-09-23 MED ORDER — WARFARIN SODIUM 4 MG PO TABS
4.0000 mg | ORAL_TABLET | Freq: Once | ORAL | Status: AC
  Administered 2015-09-23: 4 mg via ORAL
  Filled 2015-09-23: qty 1

## 2015-09-23 MED ORDER — ACETAMINOPHEN 650 MG RE SUPP: 650.0000 mg | Freq: Four times a day (QID) | RECTAL | Status: DC | PRN

## 2015-09-23 MED ORDER — POTASSIUM CHLORIDE CRYS ER 20 MEQ PO TBCR
20.0000 meq | EXTENDED_RELEASE_TABLET | Freq: Three times a day (TID) | ORAL | Status: DC
  Administered 2015-09-23 – 2015-09-24 (×6): 20 meq via ORAL
  Filled 2015-09-23 (×6): qty 1

## 2015-09-23 MED ORDER — HYDROCODONE-ACETAMINOPHEN 5-325 MG PO TABS
1.0000 | ORAL_TABLET | ORAL | Status: DC | PRN
  Administered 2015-09-23 – 2015-09-28 (×13): 1 via ORAL
  Filled 2015-09-23 (×14): qty 1

## 2015-09-23 MED ORDER — IPRATROPIUM-ALBUTEROL 0.5-2.5 (3) MG/3ML IN SOLN
6.0000 mL | RESPIRATORY_TRACT | Status: DC
Start: 2015-09-23 — End: 2015-09-23
  Administered 2015-09-23 (×3): 6 mL via RESPIRATORY_TRACT
  Filled 2015-09-23 (×2): qty 3
  Filled 2015-09-23: qty 6
  Filled 2015-09-23: qty 3

## 2015-09-23 MED ORDER — MUPIROCIN 2 % EX OINT
1.0000 "application " | TOPICAL_OINTMENT | Freq: Two times a day (BID) | CUTANEOUS | Status: AC
  Administered 2015-09-23 – 2015-09-27 (×10): 1 via NASAL
  Filled 2015-09-23 (×4): qty 22

## 2015-09-23 MED ORDER — METHOTREXATE 2.5 MG PO TABS: 10.0000 mg | ORAL_TABLET | ORAL | Status: DC

## 2015-09-23 MED ORDER — IPRATROPIUM-ALBUTEROL 0.5-2.5 (3) MG/3ML IN SOLN
3.0000 mL | Freq: Four times a day (QID) | RESPIRATORY_TRACT | Status: DC
  Administered 2015-09-23 – 2015-09-24 (×3): 3 mL via RESPIRATORY_TRACT
  Filled 2015-09-23 (×4): qty 3

## 2015-09-23 MED ORDER — ONDANSETRON HCL 4 MG/2ML IJ SOLN: 4.0000 mg | Freq: Four times a day (QID) | INTRAMUSCULAR | Status: DC | PRN

## 2015-09-23 MED ORDER — SODIUM CHLORIDE 0.9 % IV BOLUS (SEPSIS)
500.0000 mL | Freq: Once | INTRAVENOUS | Status: AC
  Administered 2015-09-23: 250 mL via INTRAVENOUS

## 2015-09-23 MED ORDER — DILTIAZEM HCL 100 MG IV SOLR
5.0000 mg/h | Freq: Once | INTRAVENOUS | Status: AC
  Administered 2015-09-23: 5 mg/h via INTRAVENOUS
  Filled 2015-09-23: qty 100

## 2015-09-23 MED ORDER — MINOCYCLINE HCL 100 MG PO CAPS
100.0000 mg | ORAL_CAPSULE | Freq: Two times a day (BID) | ORAL | Status: DC
  Administered 2015-09-23 – 2015-09-28 (×11): 100 mg via ORAL
  Filled 2015-09-23 (×15): qty 1

## 2015-09-23 MED ORDER — ATORVASTATIN CALCIUM 20 MG PO TABS
20.0000 mg | ORAL_TABLET | Freq: Every day | ORAL | Status: DC
  Administered 2015-09-23 – 2015-09-26 (×4): 20 mg via ORAL
  Filled 2015-09-23 (×4): qty 1

## 2015-09-23 MED ORDER — METOPROLOL TARTRATE 25 MG PO TABS
75.0000 mg | ORAL_TABLET | Freq: Two times a day (BID) | ORAL | Status: DC
  Filled 2015-09-23 (×2): qty 1

## 2015-09-23 MED ORDER — FUROSEMIDE 10 MG/ML IJ SOLN: 40.0000 mg | Freq: Two times a day (BID) | INTRAMUSCULAR | Status: DC

## 2015-09-23 MED ORDER — DILTIAZEM HCL 100 MG IV SOLR
5.0000 mg/h | INTRAVENOUS | Status: DC
  Administered 2015-09-23: 7.5 mg/h via INTRAVENOUS

## 2015-09-23 MED ORDER — SENNOSIDES-DOCUSATE SODIUM 8.6-50 MG PO TABS: 1.0000 | ORAL_TABLET | Freq: Every evening | ORAL | Status: DC | PRN

## 2015-09-23 MED ORDER — TETANUS-DIPHTH-ACELL PERTUSSIS 5-2.5-18.5 LF-MCG/0.5 IM SUSP
0.5000 mL | Freq: Once | INTRAMUSCULAR | Status: AC
  Administered 2015-09-23: 0.5 mL via INTRAMUSCULAR
  Filled 2015-09-23: qty 0.5

## 2015-09-23 MED ORDER — DILTIAZEM HCL 25 MG/5ML IV SOLN
10.0000 mg | Freq: Once | INTRAVENOUS | Status: AC
Start: 2015-09-23 — End: 2015-09-23
  Administered 2015-09-23: 10 mg via INTRAVENOUS
  Filled 2015-09-23: qty 5

## 2015-09-23 MED ORDER — LEVOTHYROXINE SODIUM 125 MCG PO TABS
125.0000 ug | ORAL_TABLET | Freq: Every day | ORAL | Status: DC
  Administered 2015-09-24 – 2015-09-28 (×5): 125 ug via ORAL
  Filled 2015-09-23 (×7): qty 1

## 2015-09-23 MED ORDER — MORPHINE SULFATE (PF) 2 MG/ML IV SOLN
2.0000 mg | Freq: Once | INTRAVENOUS | Status: AC
  Administered 2015-09-23: 2 mg via INTRAVENOUS
  Filled 2015-09-23: qty 1

## 2015-09-23 MED ORDER — DILTIAZEM HCL 25 MG/5ML IV SOLN
10.0000 mg | Freq: Once | INTRAVENOUS | Status: AC
  Administered 2015-09-23: 10 mg via INTRAVENOUS

## 2015-09-23 MED ORDER — DIVALPROEX SODIUM 125 MG PO CSDR
125.0000 mg | DELAYED_RELEASE_CAPSULE | Freq: Two times a day (BID) | ORAL | Status: DC
  Administered 2015-09-23 – 2015-09-27 (×9): 125 mg via ORAL
  Filled 2015-09-23 (×10): qty 1

## 2015-09-23 MED ORDER — DILTIAZEM HCL ER COATED BEADS 180 MG PO CP24
180.0000 mg | ORAL_CAPSULE | Freq: Two times a day (BID) | ORAL | Status: DC
  Administered 2015-09-23: 180 mg via ORAL
  Filled 2015-09-23 (×2): qty 1

## 2015-09-23 MED ORDER — FUROSEMIDE 10 MG/ML IJ SOLN
40.0000 mg | INTRAMUSCULAR | Status: AC
  Administered 2015-09-23: 40 mg via INTRAVENOUS
  Filled 2015-09-23: qty 4

## 2015-09-23 NOTE — Progress Notes (Signed)
ANTICOAGULATION CONSULT NOTE - New Consult  Pharmacy Consult for Coumadin Indication: atrial fibrillation  Allergies  Allergen Reactions  . Ambien [Zolpidem Tartrate]     unknown  . Sulfa Antibiotics Nausea And Vomiting  . Clindamycin/Lincomycin Swelling and Rash    Patient Measurements:    Vital Signs: Temp: 100.4 F (38 C) (10/29 0906) Temp Source: Rectal (10/29 0906) BP: 135/67 mmHg (10/29 0900) Pulse Rate: 141 (10/29 0900)  Labs:  Recent Labs  09/21/15 0304 09/22/15 0530 09/23/15 0635  HGB 9.1*  --  9.5*  HCT 30.0*  --  30.0*  PLT 164  --  234  LABPROT 29.0* 18.8* 20.3*  INR 2.79* 1.57* 1.73*  CREATININE 1.12* 1.15* 1.22*  CKTOTAL  --   --  42    Estimated Creatinine Clearance: 35.9 mL/min (by C-G formula based on Cr of 1.22).   Assessment: Pt discharge 10/28, [facility]. ruised/swollen area at R temple and bruising with small laceration on R elbow  Anticoagulation:  Coum PTA for afib; (5 mg MWF 2.5 mg TTSS) with admit INR 5.2 on previous admission. INR 2.79>1.57 s/p Vit K 2.5mg  po (10/27);  Hg 8.1>9.1, pltc 140>164 . Hematomas to back of head from fall at SNF last Wednesday; head CT neg. CHADS2Vasc 5. INR up to 1.73 today. CBC stable from previous.    Goal of Therapy:  INR 2-3 Monitor platelets by anticoagulation protocol: Yes   Plan:  Coumadin 4mg  po x 1 again today to try and reverse Vitamin K. Daily INR   Kem Hensen S. Merilynn Finlandobertson, PharmD, BCPS Clinical Staff Pharmacist Pager [phone]  Misty Stanleyobertson, Mayerly Kaman Stillinger 09/23/2015,9:50 AM

## 2015-09-23 NOTE — ED Notes (Signed)
Patient transported to CT 

## 2015-09-23 NOTE — Progress Notes (Signed)
Noted low bp 77/43 NP made aware.Diltiazem turned off. Will monitor vital every hr per order.

## 2015-09-23 NOTE — ED Notes (Signed)
IV team at bedside at this time. 

## 2015-09-23 NOTE — H&P (Addendum)
[facility] History and Physical  QUINNLEY COLASURDO ZHY:[id number] DOB: 1933/02/27 DOA: 09/23/2015  Referring physician: hedges PCP: Ignatius Specking., MD   Chief Complaint: fall/sob  HPI: WALLACE COGLIANO is a 81 y.o. female with past medical history of chronic respiratory failure on 2 L of oxygen, COPD, CHF, A. Fib, , hypertension, [name] presents to the emergency department after being found on the floor at the skilled nursing facility where she resides. Initial evaluation in the emergency department reveals acute on chronic respiratory failure with hypoxia, A. fib with RVR, Acute on chronic kidney disease, acute on chronic diastolic heart failure with mild COPD exacerbation.  Information is obtained from chart and emergency room staff. She was found on the floor at the facility EMS reports oxygen saturation level in the 80s in the field. Her oxygen was up to 4 L she was transported to the emergency department. EMS also reports that the facility staff reported her mentation was at baseline. No report of any recent fever cough nausea vomiting diarrhea. No report of chest pain palpitations syncope or near-syncope. Patient did complaint of "pain all over".  Workup in the emergency department includes basic metabolic panel significant for chloride of 90, CO2 35 BUN 21 creatinine 1.22, complete blood count with hemoglobin of 9.5 hematocrit 30.0, BNP 451.4. INR 1.73. EKG reveals A. fib with RVR. Magnesium CK within the limits of normal. In the emergency department she is afebrile hemodynamically stable but tachycardic in the 120s to 130s. Oxygen saturation level greater than 90% on 4 L. She is provided with diltiazem loading dose and extended release capsule with little improvement therefore diltiazem drip was started. She was also provided with DuoNeb's and morphine.  Of note patient had a admission 2 days ago related to similar issues.   Review of Systems:  10 point review of  systems complete and all systems negative except as indicated in the history of present illness. Of note at the time of interview patient with somewhat confused and information may be unreliable. Other information obtained from the chart Past Medical History  Diagnosis Date  . Rash     [name] rash  . Anxiety   . Thyroid disease   . Hyperlipidemia   . Hypertension   . COPD (chronic obstructive pulmonary disease) (HCC)   . Poor historian   . Carotid artery disease (HCC)   . Atrial fibrillation (HCC) 02/11/2015  . Chronic respiratory failure with hypoxia (HCC) 02/11/2015  . 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 Collagen vascular disease (HCC)   . CHF (congestive heart failure) [facility])    Past Surgical History  Procedure Laterality Date  . Back surgery     Social History:  reports that she has quit smoking. She does not have any smokeless tobacco history on file. She reports that she does not drink alcohol or use illicit drugs. She is in a skilled nursing facility mostly bedbound. She is usually alert with intermittent memory issues. She is able to make her once and needs known Allergies  Allergen Reactions  . Ambien [Zolpidem Tartrate]     unknown  . Sulfa Antibiotics Nausea And Vomiting  . Clindamycin/Lincomycin Swelling and Rash    Family History  Problem Relation Age of Onset  . Stroke Father   . Cancer Mother   . COPD Sister   . Emphysema Sister   . Other Brother     BRAIN TUMOR  . Colon cancer Brother      Prior to Admission medications  Medication Sig Start Date End Date Taking? Authorizing Provider  acetaminophen (TYLENOL) 650 MG CR tablet Take 650 mg by mouth every 8 (eight) hours as needed for pain.   Yes Historical Provider, MD  atorvastatin (LIPITOR) 20 MG tablet Take 20 mg by mouth daily at 6 PM.    Yes Historical Provider, MD  calcitonin, salmon, (MIACALCIN/FORTICAL) 200 UNIT/ACT nasal spray Place 1 spray into alternate nostrils daily.   Yes Historical  Provider, MD  cloNIDine (CATAPRES) 0.1 MG tablet Take 0.1 mg by mouth 2 (two) times daily as needed (Take only if Blood pressure greater than 200).   Yes Historical Provider, MD  cyanocobalamin 500 MCG tablet Place 500 mcg under the tongue daily.   Yes Historical Provider, MD  diltiazem (CARDIZEM CD) 180 MG 24 hr capsule Take 1 capsule (180 mg total) by mouth 2 (two) times daily. 09/22/15  Yes Starleen Armsawood S Elgergawy, MD  divalproex (DEPAKOTE SPRINKLE) 125 MG capsule Take 125 mg by mouth 2 (two) times daily.   Yes Historical Provider, MD  feeding supplement, ENSURE ENLIVE, (ENSURE ENLIVE) LIQD Take 237 mLs by mouth 2 (two) times daily between meals. 09/22/15  Yes Starleen Armsawood S Elgergawy, MD  fentaNYL (DURAGESIC - DOSED MCG/HR) 12 MCG/HR Place 1 patch (12.5 mcg total) onto the skin every 3 (three) days. 09/22/15  Yes Starleen Armsawood S Elgergawy, MD  ferrous sulfate 325 (65 FE) MG tablet Take 325 mg by mouth daily.   Yes Historical Provider, MD  folic acid (FOLVITE) 1 MG tablet Take 1 mg by mouth daily.   Yes Historical Provider, MD  furosemide (LASIX) 40 MG tablet Take 40 mg by mouth daily.   Yes Historical Provider, MD  HYDROcodone-acetaminophen (NORCO/VICODIN) 5-325 MG tablet Take 1 tablet by mouth every 4 (four) hours as needed for moderate pain. 09/22/15  Yes Starleen Armsawood S Elgergawy, MD  hydrOXYzine (ATARAX/VISTARIL) 25 MG tablet Take 25 mg by mouth at bedtime.    Yes Historical Provider, MD  hydrOXYzine (ATARAX/VISTARIL) 25 MG tablet Take 25 mg by mouth 3 (three) times daily as needed for itching.   Yes Historical Provider, MD  levalbuterol Pauline Aus(XOPENEX) 0.63 MG/3ML nebulizer solution Take 3 mLs (0.63 mg total) by nebulization every 6 (six) hours as needed for wheezing or shortness of breath. 02/17/15  Yes Erick BlinksJehanzeb Memon, MD  levothyroxine (SYNTHROID, LEVOTHROID) 125 MCG tablet Take 125 mcg by mouth daily before breakfast.   Yes Historical Provider, MD  loratadine (CLARITIN) 10 MG tablet Take 10 mg by mouth daily.   Yes  Historical Provider, MD  LORazepam (ATIVAN) 1 MG tablet Take 1 tablet (1 mg total) by mouth every 12 (twelve) hours as needed for anxiety. 09/22/15  Yes Starleen Armsawood S Elgergawy, MD  methotrexate 2.5 MG tablet Take 10 mg by mouth once a week. On Sunday.   Yes Historical Provider, MD  metolazone (ZAROXOLYN) 2.5 MG tablet Take 2.5 mg by mouth every Monday.    Yes Historical Provider, MD  metoprolol tartrate (LOPRESSOR) 25 MG tablet Take 3 tablets (75 mg total) by mouth 2 (two) times daily. 09/22/15  Yes Starleen Armsawood S Elgergawy, MD  minocycline (MINOCIN,DYNACIN) 100 MG capsule Take 100 mg by mouth 2 (two) times daily.   Yes Historical Provider, MD  OXYGEN Inhale 2 L into the lungs continuous.   Yes Historical Provider, MD  potassium chloride SA (K-DUR,KLOR-CON) 20 MEQ tablet Take 20 mEq by mouth 3 (three) times daily. For 3 days   Yes Historical Provider, MD  traZODone (DESYREL) 100 MG tablet Take 100  mg by mouth at bedtime.   Yes Historical Provider, MD  Vilazodone HCl (VIIBRYD) 20 MG TABS Take 20 mg by mouth daily.   Yes Historical Provider, MD  Vitamin D, Ergocalciferol, (DRISDOL) 50000 UNITS CAPS capsule Take 50,000 Units by mouth every 7 (seven) days. Take on thursdays   Yes Historical Provider, MD  warfarin (COUMADIN) 2.5 MG tablet Take 2.5 mg by mouth every Tuesday, Thursday, Saturday, and Sunday at 6 PM.    Yes Historical Provider, MD  warfarin (COUMADIN) 5 MG tablet Take 5 mg by mouth every Monday, Wednesday, and Friday at 6 PM. Take 1 tablet every Monday, Wednesday, and Friday   Yes Historical Provider, MD   Physical Exam: Filed Vitals:   09/23/15 0806 09/23/15 0830 09/23/15 0900 09/23/15 0906  BP: 131/57 144/52 135/67   Pulse: 152 133 141   Temp:    100.4 F (38 C)  TempSrc:    Rectal  Resp: 20 24 22    SpO2: 94% 96% 100%     Wt Readings from Last 3 Encounters:  09/19/15 72.576 kg (160 lb)  09/11/15 72.576 kg (160 lb)  07/04/15 76.204 kg (168 lb)    General:  Appears slightly anxious,  irritable and uncomfortable Eyes: PERRL, normal lids, irises & conjunctiva ENT: grossly normal hearing, mucous membranes of her mouth are pink but dry. Oropharynx without erythema or exudate Neck: no LAD, masses or thyromegaly Cardiovascular: Irregularly irregular I hear no murmur no gallop no rub trace lower extremity edema  Respiratory:  Normal respiratory effort but rest percent shallow. Breath sounds diffusely diminished with fine crackles particularly in the bases faint expiratory wheeze Abdomen: soft, ntnd positive bowel sounds throughout no guarding or rebounding Skin: Some erythema and coordination in skin folds particularly under breasts, backside with purpura and fluid-filled blisters. Musculoskeletal: grossly normal tone BUE/BLE Psychiatric: Lightly agitated and somewhat resistant to physical exam. Neurologic: Moves all extremities speech clear facial symmetry bilateral grip 5 out of 5 oriented to self only somewhat uncooperative           Labs on Admission:  Basic Metabolic Panel:  Recent Labs Lab 09/19/15 1634 09/20/15 0308 09/21/15 0304 09/22/15 0530 09/23/15 0635  NA 143 141 141 138 137  K 4.4 3.8 3.4* 3.2* 4.4  CL 99* 97* 96* 89* 90*  CO2 37* 30 34* 36* 35*  GLUCOSE 95 101* 96 82 101*  BUN 35* 25* 24* 17 21*  CREATININE 1.30* 1.10* 1.12* 1.15* 1.22*  CALCIUM 9.0 8.5* 8.6* 8.4* 8.7*  MG  --   --  1.2* 1.5* 2.3   Liver Function Tests:  Recent Labs Lab 09/19/15 1634  AST 20  ALT 10*  ALKPHOS 67  BILITOT 0.5  PROT 5.7*  ALBUMIN 1.9*   No results for input(s): LIPASE, AMYLASE in the last 168 hours. No results for input(s): AMMONIA in the last 168 hours. CBC:  Recent Labs Lab 09/19/15 1634 09/20/15 0308 09/21/15 0304 09/23/15 0635  WBC 4.1 3.9* 5.0 8.1  NEUTROABS  --   --   --  5.1  HGB 8.9* 8.1* 9.1* 9.5*  HCT 28.5* 25.9* 30.0* 30.0*  MCV 97.9 97.7 99.3 97.4  PLT 148* 140* 164 234   Cardiac Enzymes:  Recent Labs Lab 09/19/15 1634  09/23/15 0635  CKTOTAL  --  42  TROPONINI 0.03  --     BNP (last 3 results)  Recent Labs  12/29/14 1258 02/09/15 1030 09/23/15 0635  BNP 593.0* 676.0* 451.4*    ProBNP (last  3 results) No results for input(s): PROBNP in the last 8760 hours.  CBG: No results for input(s): GLUCAP in the last 168 hours.  Radiological Exams on Admission: Dg Chest 2 View  09/23/2015  CLINICAL DATA:  Status post fall, with right arm pain and weakness. Concern for chest injury. Initial encounter. EXAM: CHEST  2 VIEW COMPARISON:  Chest radiograph performed 09/19/2015 FINDINGS: The lungs are well-aerated. Vascular congestion is noted. Mildly increased interstitial markings raise concern for mild interstitial edema. Mild bibasilar opacities likely reflect atelectasis. Small bilateral pleural effusions are seen. No pneumothorax is identified. The heart is mildly enlarged. No acute osseous abnormalities are seen. There is chronic deformity of the right humeral head. IMPRESSION: 1. Vascular congestion and mild cardiomegaly. Mildly increased interstitial markings raise concern for mild interstitial edema. Mild bibasilar opacities likely reflect atelectasis. Small bilateral pleural effusions seen. 2. No displaced rib fracture seen. Electronically Signed   By: Roanna Raider M.D.   On: 09/23/2015 05:52   Dg Forearm Right  09/23/2015  CLINICAL DATA:  Larey Seat today, forehead bruising, weakness RIGHT arm. EXAM: RIGHT FOREARM - 2 VIEW COMPARISON:  None. FINDINGS: There is no evidence of fracture or other focal bone lesions. Soft tissues are unremarkable. IMPRESSION: Negative. Electronically Signed   By: Awilda Metro M.D.   On: 09/23/2015 05:58   Ct Head Wo Contrast  09/23/2015  CLINICAL DATA:  Status post fall, with large right parietal scalp hematoma. Initial encounter. EXAM: CT HEAD WITHOUT CONTRAST CT MAXILLOFACIAL WITHOUT CONTRAST TECHNIQUE: Multidetector CT imaging of the head and maxillofacial structures were  performed using the standard protocol without intravenous contrast. Multiplanar CT image reconstructions of the maxillofacial structures were also generated. COMPARISON:  CT of the head performed 09/19/2015 FINDINGS: CT HEAD FINDINGS There is no evidence of acute infarction, mass lesion, or intra- or extra-axial hemorrhage on CT. Prominence of the ventricles and sulci reflects mild cortical volume loss. Mild cerebellar atrophy is noted. Scattered periventricular and subcortical white matter change likely reflects small vessel ischemic microangiopathy. The brainstem and fourth ventricle are within normal limits. The basal ganglia are unremarkable in appearance. The cerebral hemispheres demonstrate grossly normal gray-white differentiation. No mass effect or midline shift is seen. There is no evidence of fracture; visualized osseous structures are unremarkable in appearance. The orbits are within normal limits. The paranasal sinuses and mastoid air cells are well-aerated. Soft tissue swelling is noted overlying the right parietal calvarium. CT MAXILLOFACIAL FINDINGS There is no evidence of fracture or dislocation. The maxilla and mandible appear intact. The nasal bone is unremarkable in appearance. The visualized dentition demonstrates no acute abnormality. The orbits are intact bilaterally. The visualized paranasal sinuses and mastoid air cells are well-aerated. Soft tissue swelling is noted lateral to the right orbit. The parapharyngeal fat planes are preserved. The nasopharynx, oropharynx and hypopharynx are unremarkable in appearance. The visualized portions of the valleculae and piriform sinuses are grossly unremarkable. The parotid and submandibular glands are within normal limits. No cervical lymphadenopathy is seen. IMPRESSION: 1. No evidence of traumatic intracranial injury or fracture. 2. No evidence of fracture or dislocation with regard to the maxillofacial structures. 3. Soft tissue swelling overlying the  right parietal calvarium, extending lateral to the right orbit. 4. Mild cortical volume loss and scattered small vessel ischemic microangiopathy. Electronically Signed   By: Roanna Raider M.D.   On: 09/23/2015 05:42   Ct Maxillofacial Wo Cm  09/23/2015  CLINICAL DATA:  Status post fall, with large right parietal scalp hematoma. Initial  encounter. EXAM: CT HEAD WITHOUT CONTRAST CT MAXILLOFACIAL WITHOUT CONTRAST TECHNIQUE: Multidetector CT imaging of the head and maxillofacial structures were performed using the standard protocol without intravenous contrast. Multiplanar CT image reconstructions of the maxillofacial structures were also generated. COMPARISON:  CT of the head performed 09/19/2015 FINDINGS: CT HEAD FINDINGS There is no evidence of acute infarction, mass lesion, or intra- or extra-axial hemorrhage on CT. Prominence of the ventricles and sulci reflects mild cortical volume loss. Mild cerebellar atrophy is noted. Scattered periventricular and subcortical white matter change likely reflects small vessel ischemic microangiopathy. The brainstem and fourth ventricle are within normal limits. The basal ganglia are unremarkable in appearance. The cerebral hemispheres demonstrate grossly normal gray-white differentiation. No mass effect or midline shift is seen. There is no evidence of fracture; visualized osseous structures are unremarkable in appearance. The orbits are within normal limits. The paranasal sinuses and mastoid air cells are well-aerated. Soft tissue swelling is noted overlying the right parietal calvarium. CT MAXILLOFACIAL FINDINGS There is no evidence of fracture or dislocation. The maxilla and mandible appear intact. The nasal bone is unremarkable in appearance. The visualized dentition demonstrates no acute abnormality. The orbits are intact bilaterally. The visualized paranasal sinuses and mastoid air cells are well-aerated. Soft tissue swelling is noted lateral to the right orbit. The  parapharyngeal fat planes are preserved. The nasopharynx, oropharynx and hypopharynx are unremarkable in appearance. The visualized portions of the valleculae and piriform sinuses are grossly unremarkable. The parotid and submandibular glands are within normal limits. No cervical lymphadenopathy is seen. IMPRESSION: 1. No evidence of traumatic intracranial injury or fracture. 2. No evidence of fracture or dislocation with regard to the maxillofacial structures. 3. Soft tissue swelling overlying the right parietal calvarium, extending lateral to the right orbit. 4. Mild cortical volume loss and scattered small vessel ischemic microangiopathy. Electronically Signed   By: Roanna Raider M.D.   On: 09/23/2015 05:42    EKG: Independently reviewed A. fib with RVR  Assessment/Plan Principal Problem:   Acute on chronic respiratory failure [facility]): Multifactorial predominantly acute on chronic diastolic heart failure with A. fib and RVR in the setting of mild COPD exacerbation. Will admit to step down. She is requiring more oxygen than her usual 2 L to keep her oxygen saturation level greater than 90%. Chest x-ray with some pulmonary edema. All continue Cardizem drip initiated in the emergency department. Will start Lasix for gentle diuresis. Monitor intake and output obtain daily weights. Will continue beta blocker with parameters. Wean oxygen to baseline as able. Active Problems:  Atrial fibrillation with RVR [facility]): Chads score 5 We'll continue Cardizem drip and titrate to heart rate. We'll transition over to oral when able. Home diltiazem dose is 180 mg twice a day according to med rec. INR subtherapeutic. Will ask pharmacy to dose Coumadin. Monitor and step down. Of note she had a cardiology consult last admission at that point metoprolol was increased as well as diltiazem.  Chronic diastolic CHF (congestive heart failure) (HCC): Acute on chronic diastolic. Chest x-ray with pulmonary congestion. BNP slightly  elevated. Home medications include Lasix 40 mg daily, beta blocker, Zaroxolyn. Will gently diurese IV Lasix. Monitor intake and output obtain daily weights. Will transition to oral when able. Recent echo in March of this year reveals an EF of 60% with grade 2 diastolic dysfunction. Of note last hospitalization dry weight noted to be 160 pounds.    COPD (chronic obstructive pulmonary disease) (HCC): With mild exacerbation. Faint wheeze on exam. Will continue  scheduled nebulizers. She wears oxygen 2 L and facility. Lab work consistent with retainer. She does have some  nebulizers on her home medications when necessary.   Acute renal failure superimposed on stage 3 chronic kidney disease (HCC) Acute on chronic. Current creatinine slightly above baseline. Hold nephrotoxins except as noted above. Follow urine output.  Fall.: Etiology unclear. Patient reports not remembering falling 1 minute and states she tripped  CT of her head without acute abnormality. X-ray of forearm on right and facial with tissue swelling no fracture. Initial troponin negative. We'll cycle for thoroughness. No signs of infection per chest x-ray, no leukocytosis. Rectal temp 100.4. will obtain a urinalysis. She is hemodynamically stable and alert. Will check a lactic acid influenza panel. She is bedbound at facility. Physical therapy evaluation    Macrocytic anemia: Current hemoglobin appears close to baseline. No signs symptoms of active bleeding. Likely related to chronic disease. Will continue iron supplement.    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 Skin with excoriation erythema articularly skinfolds. Backside with purpura and fluid-filled blisters. Very tender to touch. Will request wound consult    Lung mass: Per imaging. Workup as outpatient scheduled. Pulmonology appointment OP.  PET scan to be done outpatient. Son confirms  family hx cancer   Code Status: DNR DVT Prophylaxis: Patient  on oral anticoagulation with warfarin, SCDs. Family Communication: son per phone with Dr Linna Darner Disposition Plan: back to facility  Time spent: 75 minutes  [facility]  Attestation: Patient seen and examined. Agree with Ms. Maia Plan assessment and recommendations. 81 year old lady from a skilled nursing facility currently being admitted to the hospitalist service for acute on chronic hypoxic respiratory failure, heart failure with preserved ejection fraction, atrial fibrillation with rapid ventricular response in the setting of recent falls, recently undergoing workup for lung nodule. Call placed to patient's son Mertie Clause at [phone]. Patient has out of facility DO NOT RESUSCITATE that has been discussed with the patient's son. This will be honored for this hospitalization. Patient has baseline dementia. IV diuretics, Cardizem drip, cycling of cardiac enzymes, supplemental oxygen, nebulized breathing treatments on an as-needed basis. Low-grade rectal temperature in the emergency department follow-up on urine analysis and flu testing. Will need to ponder on whether to continue Coumadin in light of frequent falls.   Rosalin Hawking MD [facility] [place] [phone]

## 2015-09-23 NOTE — ED Provider Notes (Signed)
  89 YOF signed out to me at time of shift change by Tomasita CrumbleAdeleke Oni MD pending lab results and hospital admission. Pt presents SP fall with multiple injuries, hypoxic, with A-fib RVR. History of the same that required hospital admission. Pt given duoneb x 2  here in the ED with minor improvement in hypoxemia and wheeze, still requiring 4 liters NS (home 02 2L) with sats at 94%. Two doses of 10mg  Diltiazem with no improvement in RVR, started on drip. Pt was extremely agitated with complaints of multiple injuries from her fall, she was given morphine 2mg  that improved discomfort and agitation. Hospital service consulted for admission.   Eyvonne MechanicJeffrey Taralee Marcus, PA-C 09/23/15 2027  Leta BaptistEmily Roe Nguyen, MD 09/23/15 [phone]

## 2015-09-23 NOTE — ED Provider Notes (Signed)
CSN: [id number]     Arrival date & time 09/23/15  0424 History   First MD Initiated Contact with Patient 09/23/15 0425     Chief Complaint  Patient presents with  . Fall     (Consider location/radiation/quality/duration/timing/severity/associated sxs/prior Treatment) HPI Sierra Singleton is a 81yo female, PMH of a fib on coumadin, COPD on home2L O2, here with a fall.  Patient was found down at nursing home.  She was trying to go to the bathroom.  She was on the floor for 1hr before they got her up.  She has obvious injury to her R forearm and R frontal bone.  She denies pain anywhere however she can not give her own history and is a known poor historian.  I see no dementia on her chart.  History was obtained from EMS.  Per EMS, nursing home stated that the patient is at her baseline.   Past Medical History  Diagnosis Date  . Rash     [name] rash  . Anxiety   . Thyroid disease   . Hyperlipidemia   . Hypertension   . COPD (chronic obstructive pulmonary disease) (HCC)   . Poor historian   . Carotid artery disease (HCC)   . Atrial fibrillation (HCC) 02/11/2015  . Chronic respiratory failure with hypoxia (HCC) 02/11/2015  . 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 Collagen vascular disease (HCC)   . CHF (congestive heart failure) [facility])    Past Surgical History  Procedure Laterality Date  . Back surgery     Family History  Problem Relation Age of Onset  . Stroke Father   . Cancer Mother   . COPD Sister   . Emphysema Sister   . Other Brother     BRAIN TUMOR  . Colon cancer Brother    Social History  Substance Use Topics  . Smoking status: Former Games developer  . Smokeless tobacco: None  . Alcohol Use: No   OB History    No data available     Review of Systems  Unable to perform ROS: Age      Allergies  Ambien; Sulfa antibiotics; and Clindamycin/lincomycin  Home Medications   Prior to Admission medications   Medication Sig Start Date End Date Taking? Authorizing Provider   acetaminophen (TYLENOL) 650 MG CR tablet Take 650 mg by mouth every 8 (eight) hours as needed for pain.    Historical Provider, MD  atorvastatin (LIPITOR) 20 MG tablet Take 20 mg by mouth daily at 6 PM.     Historical Provider, MD  calcitonin, salmon, (MIACALCIN/FORTICAL) 200 UNIT/ACT nasal spray Place 1 spray into alternate nostrils daily.    Historical Provider, MD  cyanocobalamin 500 MCG tablet Place 500 mcg under the tongue daily.    Historical Provider, MD  diltiazem (CARDIZEM CD) 180 MG 24 hr capsule Take 1 capsule (180 mg total) by mouth 2 (two) times daily. 09/22/15   Leana Roe Elgergawy, MD  divalproex (DEPAKOTE SPRINKLE) 125 MG capsule Take 125 mg by mouth 2 (two) times daily.    Historical Provider, MD  feeding supplement, ENSURE ENLIVE, (ENSURE ENLIVE) LIQD Take 237 mLs by mouth 2 (two) times daily between meals. 09/22/15   Leana Roe Elgergawy, MD  fentaNYL (DURAGESIC - DOSED MCG/HR) 12 MCG/HR Place 1 patch (12.5 mcg total) onto the skin every 3 (three) days. 09/22/15   Leana Roe Elgergawy, MD  ferrous sulfate 325 (65 FE) MG tablet Take 325 mg by mouth daily.    Historical Provider, MD  folic  acid (FOLVITE) 1 MG tablet Take 1 mg by mouth daily.    Historical Provider, MD  furosemide (LASIX) 40 MG tablet Take 40 mg by mouth daily.    Historical Provider, MD  HYDROcodone-acetaminophen (NORCO/VICODIN) 5-325 MG tablet Take 1 tablet by mouth every 4 (four) hours as needed for moderate pain. 09/22/15   Leana Roe Elgergawy, MD  hydrOXYzine (ATARAX/VISTARIL) 25 MG tablet Take 25 mg by mouth at bedtime.     Historical Provider, MD  hydrOXYzine (ATARAX/VISTARIL) 25 MG tablet Take 25 mg by mouth 3 (three) times daily as needed for itching.    Historical Provider, MD  levalbuterol Pauline Aus) 0.63 MG/3ML nebulizer solution Take 3 mLs (0.63 mg total) by nebulization every 6 (six) hours as needed for wheezing or shortness of breath. 02/17/15   Erick Blinks, MD  levothyroxine (SYNTHROID, LEVOTHROID) 125 MCG  tablet Take 125 mcg by mouth daily before breakfast.    Historical Provider, MD  loratadine (CLARITIN) 10 MG tablet Take 10 mg by mouth daily.    Historical Provider, MD  LORazepam (ATIVAN) 1 MG tablet Take 1 tablet (1 mg total) by mouth every 12 (twelve) hours as needed for anxiety. 09/22/15   Leana Roe Elgergawy, MD  methotrexate 2.5 MG tablet Take 10 mg by mouth once a week. On Sunday.    Historical Provider, MD  metolazone (ZAROXOLYN) 2.5 MG tablet Take 2.5 mg by mouth every Monday.     Historical Provider, MD  metoprolol tartrate (LOPRESSOR) 25 MG tablet Take 3 tablets (75 mg total) by mouth 2 (two) times daily. 09/22/15   Leana Roe Elgergawy, MD  minocycline (MINOCIN,DYNACIN) 100 MG capsule Take 100 mg by mouth 2 (two) times daily.    Historical Provider, MD  OXYGEN Inhale 2 L into the lungs continuous.    Historical Provider, MD  potassium chloride SA (K-DUR,KLOR-CON) 20 MEQ tablet Take 20 mEq by mouth 3 (three) times daily.    Historical Provider, MD  traZODone (DESYREL) 100 MG tablet Take 100 mg by mouth at bedtime.    Historical Provider, MD  Vilazodone HCl (VIIBRYD) 20 MG TABS Take 20 mg by mouth daily.    Historical Provider, MD  Vitamin D, Ergocalciferol, (DRISDOL) 50000 UNITS CAPS capsule Take 50,000 Units by mouth every 7 (seven) days. Take on thursdays    Historical Provider, MD  warfarin (COUMADIN) 2.5 MG tablet Take 2.5 mg by mouth every Tuesday, Thursday, Saturday, and Sunday at 6 PM.     Historical Provider, MD  warfarin (COUMADIN) 5 MG tablet Take 5 mg by mouth every Monday, Wednesday, and Friday at 6 PM. Take 1 tablet every Monday, Wednesday, and Friday    Historical Provider, MD   BP 161/59 mmHg  Pulse 70  Temp(Src) 98.4 F (36.9 C) (Oral)  Resp 26  SpO2 100% Physical Exam  Constitutional: She appears well-developed and well-nourished. No distress.  HENT:  Head: Normocephalic.  Nose: Nose normal.  Mouth/Throat: Oropharynx is clear and moist. No oropharyngeal exudate.   Soft tissue swelling to the R frontal bone  Sanger in place  Eyes: Conjunctivae and EOM are normal. Pupils are equal, round, and reactive to light. No scleral icterus.  Neck: Normal range of motion. Neck supple. No JVD present. No tracheal deviation present. No thyromegaly present.  Cardiovascular: Normal heart sounds.  Exam reveals no gallop and no friction rub.   No murmur heard. irreg irreg rhythm.  tachycardia  Pulmonary/Chest: Effort normal. No respiratory distress. She has wheezes. She exhibits no tenderness.  tachypneic  Abdominal: Soft. Bowel sounds are normal. She exhibits no distension and no mass. There is no tenderness. There is no rebound and no guarding.  Musculoskeletal: Normal range of motion. She exhibits no edema or tenderness.  Lymphadenopathy:    She has no cervical adenopathy.  Neurological: She is alert. No cranial nerve deficit. She exhibits normal muscle tone.  Moves all extremities  Skin: Skin is warm and dry. No rash noted. No erythema. No pallor.  Nursing note and vitals reviewed.   ED Course  Procedures (including critical care time) Labs Review Labs Reviewed  CBC WITH DIFFERENTIAL/PLATELET  BASIC METABOLIC PANEL  BRAIN NATRIURETIC PEPTIDE  PROTIME-INR  MAGNESIUM  CK  I-STAT TROPOININ, ED    Imaging Review Dg Chest 2 View  09/23/2015  CLINICAL DATA:  Status post fall, with right arm pain and weakness. Concern for chest injury. Initial encounter. EXAM: CHEST  2 VIEW COMPARISON:  Chest radiograph performed 09/19/2015 FINDINGS: The lungs are well-aerated. Vascular congestion is noted. Mildly increased interstitial markings raise concern for mild interstitial edema. Mild bibasilar opacities likely reflect atelectasis. Small bilateral pleural effusions are seen. No pneumothorax is identified. The heart is mildly enlarged. No acute osseous abnormalities are seen. There is chronic deformity of the right humeral head. IMPRESSION: 1. Vascular congestion and mild  cardiomegaly. Mildly increased interstitial markings raise concern for mild interstitial edema. Mild bibasilar opacities likely reflect atelectasis. Small bilateral pleural effusions seen. 2. No displaced rib fracture seen. Electronically Signed   By: Roanna Raider M.D.   On: 09/23/2015 05:52   Dg Forearm Right  09/23/2015  CLINICAL DATA:  Larey Seat today, forehead bruising, weakness RIGHT arm. EXAM: RIGHT FOREARM - 2 VIEW COMPARISON:  None. FINDINGS: There is no evidence of fracture or other focal bone lesions. Soft tissues are unremarkable. IMPRESSION: Negative. Electronically Signed   By: Awilda Metro M.D.   On: 09/23/2015 05:58   Ct Head Wo Contrast  09/23/2015  CLINICAL DATA:  Status post fall, with large right parietal scalp hematoma. Initial encounter. EXAM: CT HEAD WITHOUT CONTRAST CT MAXILLOFACIAL WITHOUT CONTRAST TECHNIQUE: Multidetector CT imaging of the head and maxillofacial structures were performed using the standard protocol without intravenous contrast. Multiplanar CT image reconstructions of the maxillofacial structures were also generated. COMPARISON:  CT of the head performed 09/19/2015 FINDINGS: CT HEAD FINDINGS There is no evidence of acute infarction, mass lesion, or intra- or extra-axial hemorrhage on CT. Prominence of the ventricles and sulci reflects mild cortical volume loss. Mild cerebellar atrophy is noted. Scattered periventricular and subcortical white matter change likely reflects small vessel ischemic microangiopathy. The brainstem and fourth ventricle are within normal limits. The basal ganglia are unremarkable in appearance. The cerebral hemispheres demonstrate grossly normal gray-white differentiation. No mass effect or midline shift is seen. There is no evidence of fracture; visualized osseous structures are unremarkable in appearance. The orbits are within normal limits. The paranasal sinuses and mastoid air cells are well-aerated. Soft tissue swelling is noted  overlying the right parietal calvarium. CT MAXILLOFACIAL FINDINGS There is no evidence of fracture or dislocation. The maxilla and mandible appear intact. The nasal bone is unremarkable in appearance. The visualized dentition demonstrates no acute abnormality. The orbits are intact bilaterally. The visualized paranasal sinuses and mastoid air cells are well-aerated. Soft tissue swelling is noted lateral to the right orbit. The parapharyngeal fat planes are preserved. The nasopharynx, oropharynx and hypopharynx are unremarkable in appearance. The visualized portions of the valleculae and piriform sinuses are grossly unremarkable. The  parotid and submandibular glands are within normal limits. No cervical lymphadenopathy is seen. IMPRESSION: 1. No evidence of traumatic intracranial injury or fracture. 2. No evidence of fracture or dislocation with regard to the maxillofacial structures. 3. Soft tissue swelling overlying the right parietal calvarium, extending lateral to the right orbit. 4. Mild cortical volume loss and scattered small vessel ischemic microangiopathy. Electronically Signed   By: Roanna RaiderJeffery  Chang M.D.   On: 09/23/2015 05:42   Ct Maxillofacial Wo Cm  09/23/2015  CLINICAL DATA:  Status post fall, with large right parietal scalp hematoma. Initial encounter. EXAM: CT HEAD WITHOUT CONTRAST CT MAXILLOFACIAL WITHOUT CONTRAST TECHNIQUE: Multidetector CT imaging of the head and maxillofacial structures were performed using the standard protocol without intravenous contrast. Multiplanar CT image reconstructions of the maxillofacial structures were also generated. COMPARISON:  CT of the head performed 09/19/2015 FINDINGS: CT HEAD FINDINGS There is no evidence of acute infarction, mass lesion, or intra- or extra-axial hemorrhage on CT. Prominence of the ventricles and sulci reflects mild cortical volume loss. Mild cerebellar atrophy is noted. Scattered periventricular and subcortical white matter change likely  reflects small vessel ischemic microangiopathy. The brainstem and fourth ventricle are within normal limits. The basal ganglia are unremarkable in appearance. The cerebral hemispheres demonstrate grossly normal gray-white differentiation. No mass effect or midline shift is seen. There is no evidence of fracture; visualized osseous structures are unremarkable in appearance. The orbits are within normal limits. The paranasal sinuses and mastoid air cells are well-aerated. Soft tissue swelling is noted overlying the right parietal calvarium. CT MAXILLOFACIAL FINDINGS There is no evidence of fracture or dislocation. The maxilla and mandible appear intact. The nasal bone is unremarkable in appearance. The visualized dentition demonstrates no acute abnormality. The orbits are intact bilaterally. The visualized paranasal sinuses and mastoid air cells are well-aerated. Soft tissue swelling is noted lateral to the right orbit. The parapharyngeal fat planes are preserved. The nasopharynx, oropharynx and hypopharynx are unremarkable in appearance. The visualized portions of the valleculae and piriform sinuses are grossly unremarkable. The parotid and submandibular glands are within normal limits. No cervical lymphadenopathy is seen. IMPRESSION: 1. No evidence of traumatic intracranial injury or fracture. 2. No evidence of fracture or dislocation with regard to the maxillofacial structures. 3. Soft tissue swelling overlying the right parietal calvarium, extending lateral to the right orbit. 4. Mild cortical volume loss and scattered small vessel ischemic microangiopathy. Electronically Signed   By: Roanna RaiderJeffery  Chang M.D.   On: 09/23/2015 05:42   I have personally reviewed and evaluated these images and lab results as part of my medical decision-making.   EKG Interpretation   Date/Time:  Saturday September 23 2015 04:33:27 EDT Ventricular Rate:  137 PR Interval:    QRS Duration: 82 QT Interval:  317 QTC Calculation:  479 R Axis:   79 Text Interpretation:  Atrial fibrillation with rapid V-rate tachycardia  now present Confirmed by Erroll Lunani, Zienna Ahlin Ayokunle [phone]([id number]) on 09/23/2015  4:48:11 AM      MDM   Final diagnoses:  None    Patient presents to the ED after a fall.  CT scan and xrays are negative.  She is in a fib with RVR in the 130s, given dilt 10mg  for treatment.  She continues to be hypoxic on her home O2 2L [place].  She is currently on 4L.  Likely from COPD exacerbation as there is no evidence of fluid overload.  Bedside US does not show any B lines.  Patient given duoneb and will require  admission for further care.  Patient singed out as labs studies are not back yet.   EMERGENCY DEPARTMENT [place] LUNG EXAM "Study: Limited Ultrasound of the lung and thorax"   INDICATIONS: Dyspnea  Multiple views of both lungs using sagittal orientation were obtained.  PERFORMED BY: Myself  IMAGES ARCHIVED?: Yes  FINDINGS: Normal lung parenchyma  LIMITATIONS: None  VIEWS USED: Anterior Lung Fields  INTERPRETATION: No Pleural Effusion  CPT Code: 29562-13 (limited transthoracic lung)      Tomasita Crumble, MD 09/23/15 [phone]

## 2015-09-23 NOTE — ED Notes (Addendum)
Large reddened area under right breast radiating into right back, non-blanchable. PA Trey PaulaJeff notified of skin problem. At bedside to assess.

## 2015-09-23 NOTE — ED Notes (Signed)
Tried placing pt in hospital gown. Pt became agitated and did not want to be removed out of clothes. Pt also complained of pain. Kendal HymenBonnie - RN aware.

## 2015-09-23 NOTE — ED Notes (Signed)
Patient arrived via PTAR. PTAR reports: patient experienced unwitnessed fall in bedroom. [facility] [place] Unknown LOC. Patient has bruised/swollen area at R temple and bruising with small laceration on R elbow. Patient takes Coumadin, and has just been discharged from hospital. BP 142/60, Pulse 84, 92% on 4 LPM via nasal cannula.

## 2015-09-24 DIAGNOSIS — J9620 Acute and chronic respiratory failure, unspecified whether with hypoxia or hypercapnia: Secondary | ICD-10-CM

## 2015-09-24 LAB — CBC
HEMATOCRIT: 26.7 % — AB (ref 36.0–46.0)
Hemoglobin: 8 g/dL — ABNORMAL LOW (ref 12.0–15.0)
MCH: 29.6 pg (ref 26.0–34.0)
MCHC: 30 g/dL (ref 30.0–36.0)
MCV: 98.9 fL (ref 78.0–100.0)
Platelets: 212 10*3/uL (ref 150–400)
RBC: 2.7 MIL/uL — ABNORMAL LOW (ref 3.87–5.11)
RDW: 16.8 % — AB (ref 11.5–15.5)
WBC: 5.1 10*3/uL (ref 4.0–10.5)

## 2015-09-24 LAB — BASIC METABOLIC PANEL
Anion gap: 9 (ref 5–15)
BUN: 26 mg/dL — AB (ref 6–20)
CALCIUM: 8.6 mg/dL — AB (ref 8.9–10.3)
CO2: 33 mmol/L — ABNORMAL HIGH (ref 22–32)
Chloride: 97 mmol/L — ABNORMAL LOW (ref 101–111)
Creatinine, Ser: 1.64 mg/dL — ABNORMAL HIGH (ref 0.44–1.00)
GFR calc Af Amer: 33 mL/min — ABNORMAL LOW (ref >60)
GFR, EST NON AFRICAN AMERICAN: 28 mL/min — AB (ref >60)
GLUCOSE: 89 mg/dL (ref 65–99)
Potassium: 5.4 mmol/L — ABNORMAL HIGH (ref 3.5–5.1)
Sodium: 139 mmol/L (ref 135–145)

## 2015-09-24 LAB — PROTIME-INR
INR: 2.6 — ABNORMAL HIGH (ref 0.00–1.49)
PROTHROMBIN TIME: 27.5 s — AB (ref 11.6–15.2)

## 2015-09-24 LAB — CULTURE, BLOOD (ROUTINE X 2): CULTURE: NO GROWTH

## 2015-09-24 LAB — TROPONIN I

## 2015-09-24 MED ORDER — FUROSEMIDE 10 MG/ML IJ SOLN
60.0000 mg | Freq: Two times a day (BID) | INTRAMUSCULAR | Status: DC
Start: 2015-09-25 — End: 2015-09-26
  Administered 2015-09-25 – 2015-09-26 (×3): 60 mg via INTRAVENOUS
  Filled 2015-09-24 (×3): qty 6

## 2015-09-24 MED ORDER — LEVALBUTEROL HCL 0.63 MG/3ML IN NEBU
0.6300 mg | INHALATION_SOLUTION | Freq: Four times a day (QID) | RESPIRATORY_TRACT | Status: DC
  Administered 2015-09-24 – 2015-09-26 (×6): 0.63 mg via RESPIRATORY_TRACT
  Filled 2015-09-24 (×6): qty 3

## 2015-09-24 MED ORDER — IPRATROPIUM BROMIDE 0.02 % IN SOLN
0.5000 mg | Freq: Four times a day (QID) | RESPIRATORY_TRACT | Status: DC
  Administered 2015-09-24 – 2015-09-26 (×6): 0.5 mg via RESPIRATORY_TRACT
  Filled 2015-09-24 (×6): qty 2.5

## 2015-09-24 MED ORDER — METOPROLOL TARTRATE 100 MG PO TABS
100.0000 mg | ORAL_TABLET | Freq: Two times a day (BID) | ORAL | Status: DC
  Administered 2015-09-24 – 2015-09-25 (×2): 100 mg via ORAL
  Filled 2015-09-24 (×2): qty 1

## 2015-09-24 MED ORDER — METOPROLOL TARTRATE 50 MG PO TABS
75.0000 mg | ORAL_TABLET | Freq: Two times a day (BID) | ORAL | Status: DC
  Administered 2015-09-24: 75 mg via ORAL
  Filled 2015-09-24 (×3): qty 1

## 2015-09-24 MED ORDER — DILTIAZEM HCL ER COATED BEADS 180 MG PO CP24
180.0000 mg | ORAL_CAPSULE | Freq: Two times a day (BID) | ORAL | Status: DC
  Administered 2015-09-24 – 2015-09-28 (×8): 180 mg via ORAL
  Filled 2015-09-24 (×7): qty 1

## 2015-09-24 NOTE — Consult Note (Signed)
WOC wound consult note Please see WOC Nurse note from Wednesday, 09/20/15 Archer Asa(D. Engels).  Presentation is unchanged, condition is outside the scope of WOC Nursing practice.  WOC nursing team did not see and will not follow. Thanks, Ladona MowLaurie Deniece Rankin, MSN, RN, GNP, [place], CWON-AP [phone])

## 2015-09-24 NOTE — Evaluation (Signed)
Physical Therapy Evaluation Patient Details Name: Sierra Singleton MRN: [id number] DOB: Feb 27, 1933 Today's Date: 09/24/2015   History of Present Illness  HPI: Sierra Singleton is a 81 y.o. female with past medical history of chronic respiratory failure on 2 L of oxygen, COPD, CHF, A. Fib, , hypertension, [name] presents to the emergency department after being found on the floor at the skilled nursing facility where she resides. Initial evaluation in the emergency department reveals acute on chronic respiratory failure with hypoxia, A. fib with RVR, Acute on chronic kidney disease, acute on chronic diastolic heart failure with mild COPD exacerbation.  Clinical Impression   Pt admitted with above diagnosis. Pt currently with functional limitations due to the deficits listed below (see PT Problem List).  Pt will benefit from skilled PT to increase their independence and safety with mobility to allow discharge to the venue listed below.    Will recommend making sure she continues with PT at SNF     Follow Up Recommendations SNF    Equipment Recommendations  None recommended by PT    Recommendations for Other Services       Precautions / Restrictions Precautions Precautions: Fall Restrictions Weight Bearing Restrictions: No      Mobility  Bed Mobility Overal bed mobility: Needs Assistance;+2 for physical assistance Bed Mobility: Supine to Sit;Sit to Supine     Supine to sit: +2 for physical assistance;Mod assist Sit to supine: +2 for physical assistance;Mod assist   General bed mobility comments: Heavy mod assist to clear LEs from EOB and bring trunk up to sit  Transfers Overall transfer level: Needs assistance Equipment used: 2 person hand held assist Transfers: Sit to/from Stand Sit to Stand: Mod assist;+2 physical assistance         General transfer comment: Mod assist to power up, but noted good recruitment for sit to stand; performed sit to stand  twice  Ambulation/Gait             General Gait Details: Not tested; has not been ambulatory at SNF per son  Stairs            Wheelchair Mobility    Modified Rankin (Stroke Patients Only)       Balance Overall balance assessment: Needs assistance   Sitting balance-Leahy Scale: Fair       Standing balance-Leahy Scale: Poor                               Pertinent Vitals/Pain Pain Assessment: Faces Faces Pain Scale: Hurts whole lot Pain Location: Pt did not specify pain location; Grimace and occasionally calling out with movement; at times reporting difficulty breathing Pain Descriptors / Indicators: Discomfort;Grimacing;Guarding;Crying Pain Intervention(s): Limited activity within patient's tolerance;Monitored during session;Repositioned    Home Living Family/patient expects to be discharged to:: Skilled nursing facility                 Additional Comments: Has been at SNF for rehab since fall with pubic rami fxs and pelvic hemorrhage in May    Prior Function Level of Independence: Needs assistance         Comments: Pt's son reports she needs assist at SNF to get into wheelchair; he is not sure if she gets OOB daily , or only when he is there     Hand Dominance        Extremity/Trunk Assessment   Upper Extremity Assessment: Generalized weakness  Lower Extremity Assessment: Generalized weakness         Communication   Communication: No difficulties  Cognition Arousal/Alertness: Awake/alert Behavior During Therapy: WFL for tasks assessed/performed;Restless Overall Cognitive Status: History of cognitive impairments - at baseline                      General Comments General comments (skin integrity, edema, etc.): HR ranged 103-137 during session; BP supine after sitting upright a few minutes was 100/58    Exercises        Assessment/Plan    PT Assessment Patient needs continued PT services  (trial basis, depending on ability to participate)  PT Diagnosis Generalized weakness;Acute pain   PT Problem List Decreased strength;Decreased range of motion;Decreased activity tolerance;Decreased balance;Decreased mobility;Decreased knowledge of use of DME;Decreased safety awareness;Decreased cognition;Cardiopulmonary status limiting activity;Pain;Decreased skin integrity  PT Treatment Interventions DME instruction;Functional mobility training;Therapeutic activities;Therapeutic exercise;Balance training;Patient/family education   PT Goals (Current goals can be found in the Care Plan section) Acute Rehab PT Goals Patient Stated Goal: did not state PT Goal Formulation: With family Time For Goal Achievement: 10/08/15 Potential to Achieve Goals: Fair    Frequency Min 3X/week   Barriers to discharge        Co-evaluation               End of Session Equipment Utilized During Treatment: Oxygen (bed pad) Activity Tolerance: Patient limited by pain Patient left: in bed;with call bell/phone within reach;with family/visitor present           Time: 1410-1426 PT Time Calculation (min) (ACUTE ONLY): 16 min   Charges:   PT Evaluation $Initial PT Evaluation Tier I: 1 Procedure     PT G CodesVan Clines:        Lourdes Kucharski Hamff 09/24/2015, 4:28 PM  Van ClinesHolly Woodie Trusty, PT  Acute Rehabilitation Services Pager [phone] Office [phone]

## 2015-09-24 NOTE — Progress Notes (Signed)
[facility] TEAM 1 - Stepdown/ICU TEAM PROGRESS NOTE  Sierra Singleton ZOX:[id number] DOB: [birth year] DOA: 09/23/2015 PCP: Ignatius Specking., MD  Admit HPI / Brief Narrative: 81 y.o. female with history of chronic respiratory failure on 2 L of oxygen, COPD, CHF, A. Fib, , hypertension, and [name] (familial benign chronic pemphigus) who presented to the ED after being found on the floor at the skilled nursing facility where she resides. Initial evaluation in the emergency department revealed acute on chronic respiratory failure with hypoxia, A. fib with RVR, Acute on chronic kidney disease, acute on chronic diastolic heart failure, and a mild COPD exacerbation.  Workup in the emergency department includes basic metabolic panel significant for chloride of 90, CO2 35 BUN 21 creatinine 1.22, complete blood count with hemoglobin of 9.5 hematocrit 30.0, BNP 451.4. INR 1.73. EKG revealed A. fib with RVR. She was afebrile and hemodynamically stable but tachycardic in the 120s to 130s. Oxygen saturation level greater than 90% on 4 L. She was provided with diltiazem loading dose and extended release capsule with little improvement therefore diltiazem drip was started.   HPI/Subjective: The patient is resting comfortably in bed.  She is somewhat confused.  She complains of "hurting all over" but denies being significantly short of breath.  Assessment/Plan:  Acute on chronic hypoxic respiratory failure Multifactorial predominantly acute on chronic diastolic heart failure with A. fib and RVR in the setting of mild COPD exacerbation - see each issue detailed below - requiring more oxygen than her usual 2 L to keep her oxygen saturation level greater than 90%  Chronic Atrial fibrillation with actue RVR CHA2DS2 - VASc 5 - INR subtherapeutic - pharmacy to dose Coumadin - rate now better controlled but not yet ideal - adjust medication and follow  Acute on chronic grade 2 diastolic CHF Chest x-ray with  pulmonary congestion - echo in March of this year EF 60% with grade 2 diastolic dysfunction - dry weight 160 pounds - presently 163 pounds - nurse has noted patient drinks very large volumes of sweet tea during the day  COPD with mild acute bronchospastic exacerbation Slowly improving - continue usual therapy and follow  Acute renal failure superimposed on stage 3 chronic kidney disease  Creatinine trending upward - follow trend  Fall Etiology unclear - CT head without acute abnormality - is reportedly bedbound at the facility  Macrocytic anemia No sign of active bleeding but hemoglobin drifting downward - following serial fashion  Hypothyroidism TSH 0.567 - continue Synthroid  [name] diagnosed 60 years ago - back side with purpura and fluid-filled blisters - request wound consult  Lung mass Workup as outpatient scheduled. Pulmonology appointment OP. PET scan to be done outpatient  Dementia?  MRSA screen +  Code Status: DNR - NO CODE  Family Communication: no family present at time of exam Disposition Plan: SDU  Consultants: none  Procedures: none  Antibiotics: Minocycline - chronic use   DVT prophylaxis: warfarin  Objective: Blood pressure 119/68, pulse 113, temperature 98.5 F (36.9 C), temperature source Oral, resp. rate 29, height  (1.626 m), SpO2 99 %.  Intake/Output Summary (Last 24 hours) at 09/24/15 1723 Last data filed at 09/24/15 1600  Gross per 24 hour  Intake   2510 ml  Output    200 ml  Net   2310 ml   Exam: General: No acute respiratory distress - mildly confused Lungs: Faint diffuse wheezing with mild bibasilar crackles  Cardiovascular: Irregularly irregular with rate approximately 105 bpm  without appreciable murmur Abdomen: Nontender, nondistended, soft, bowel sounds positive, no rebound, no ascites, no appreciable mass Extremities: No significant cyanosis, or clubbing; 1+ edema bilateral lower extremities  Data  Reviewed: Basic Metabolic Panel:  Recent Labs Lab 09/20/15 0308 09/21/15 0304 09/22/15 0530 09/23/15 0635 09/24/15 0324  NA 141 141 138 137 139  K 3.8 3.4* 3.2* 4.4 5.4*  CL 97* 96* 89* 90* 97*  CO2 30 34* 36* 35* 33*  GLUCOSE 101* 96 82 101* 89  BUN 25* 24* 17 21* 26*  CREATININE 1.10* 1.12* 1.15* 1.22* 1.64*  CALCIUM 8.5* 8.6* 8.4* 8.7* 8.6*  MG  --  1.2* 1.5* 2.3  --     CBC:  Recent Labs Lab 09/19/15 1634 09/20/15 0308 09/21/15 0304 09/23/15 0635 09/24/15 0324  WBC 4.1 3.9* 5.0 8.1 5.1  NEUTROABS  --   --   --  5.1  --   HGB 8.9* 8.1* 9.1* 9.5* 8.0*  HCT 28.5* 25.9* 30.0* 30.0* 26.7*  MCV 97.9 97.7 99.3 97.4 98.9  PLT 148* 140* 164 234 212    Liver Function Tests:  Recent Labs Lab 09/19/15 1634  AST 20  ALT 10*  ALKPHOS 67  BILITOT 0.5  PROT 5.7*  ALBUMIN 1.9*    Coags:  Recent Labs Lab 09/20/15 0308 09/21/15 0304 09/22/15 0530 09/23/15 0635 09/24/15 0324  INR 5.94* 2.79* 1.57* 1.73* 2.60*   Cardiac Enzymes:  Recent Labs Lab 09/19/15 1634 09/23/15 0635 09/23/15 1646 09/24/15 0324  CKTOTAL  --  42  --   --   TROPONINI 0.03  --  <0.03 <0.03    Recent Results (from the past 240 hour(s))  Urine culture     Status: None   Collection Time: 09/19/15  5:30 PM  Result Value Ref Range Status   Specimen Description URINE, CATHETERIZED  Final   Special Requests NONE  Final   Culture MULTIPLE SPECIES PRESENT, SUGGEST RECOLLECTION  Final   Report Status 09/20/2015 FINAL  Final  Blood Culture (routine x 2)     Status: None   Collection Time: 09/19/15  6:28 PM  Result Value Ref Range Status   Specimen Description BLOOD LEFT ARM  Final   Special Requests IN PEDIATRIC BOTTLE 1CC  Final   Culture NO GROWTH 5 DAYS  Final   Report Status 09/24/2015 FINAL  Final  MRSA PCR Screening     Status: Abnormal   Collection Time: 09/20/15  1:42 AM  Result Value Ref Range Status   MRSA by PCR POSITIVE (A) NEGATIVE Final    Comment:        The  GeneXpert MRSA Assay (FDA approved for NASAL specimens only), is one component of a comprehensive MRSA colonization surveillance program. It is not intended to diagnose MRSA infection nor to guide or monitor treatment for MRSA infections. RESULT CALLED TO, READ BACK BY AND VERIFIED WITH: M INGRAM@0415  09/20/15 MKELLY   Blood Culture (routine x 2)     Status: None (Preliminary result)   Collection Time: 09/20/15  3:08 AM  Result Value Ref Range Status   Specimen Description BLOOD LEFT ANTECUBITAL  Final   Special Requests BOTTLES DRAWN AEROBIC ONLY 4CC  Final   Culture NO GROWTH 4 DAYS  Final   Report Status PENDING  Incomplete  MRSA PCR Screening     Status: Abnormal   Collection Time: 09/23/15  9:58 AM  Result Value Ref Range Status   MRSA by PCR POSITIVE (A) NEGATIVE Final    Comment:  The GeneXpert MRSA Assay (FDA approved for NASAL specimens only), is one component of a comprehensive MRSA colonization surveillance program. It is not intended to diagnose MRSA infection nor to guide or monitor treatment for MRSA infections. RESULT CALLED TO, READ BACK BY AND VERIFIED WITH: D MUHORO,RN AT 1252 09/23/15 BY K BARR      Studies:   Recent x-ray studies have been reviewed in detail by the Attending Physician  Scheduled Meds:  Scheduled Meds: . atorvastatin  20 mg Oral q1800  . Chlorhexidine Gluconate Cloth  6 each Topical Q0600  . divalproex  125 mg Oral BID  . [START ON 09/25/2015] fentaNYL  12.5 mcg Transdermal Q72H  . ferrous sulfate  325 mg Oral Daily  . folic acid  1 mg Oral Daily  . furosemide  40 mg Intravenous BID  . ipratropium-albuterol  3 mL Nebulization Q6H  . levothyroxine  125 mcg Oral QAC breakfast  . methotrexate  10 mg Oral Q Sun  . metoprolol tartrate  75 mg Oral BID  . minocycline  100 mg Oral BID  . mupirocin ointment  1 application Nasal BID  . potassium chloride SA  20 mEq Oral TID  . traZODone  100 mg Oral QHS  . Warfarin -  Pharmacist Dosing Inpatient   Does not apply q1800    Time spent on care of this patient: 35 mins   [facility] T , MD   [facility] Office  [phone] Pager - Text Page per Amion as per below:  On-Call/Text Page:      [email or web address]      password [email or web address]  If 7PM-7AM, please contact night-coverage [email or web address] Password [email or web address] 09/24/2015, 5:23 PM   LOS: 1 day

## 2015-09-25 LAB — CBC
HCT: 27.6 % — ABNORMAL LOW (ref 36.0–46.0)
Hemoglobin: 8.3 g/dL — ABNORMAL LOW (ref 12.0–15.0)
MCH: 29.9 pg (ref 26.0–34.0)
MCHC: 30.1 g/dL (ref 30.0–36.0)
MCV: 99.3 fL (ref 78.0–100.0)
PLATELETS: 213 10*3/uL (ref 150–400)
RBC: 2.78 MIL/uL — AB (ref 3.87–5.11)
RDW: 16.8 % — AB (ref 11.5–15.5)
WBC: 4.7 10*3/uL (ref 4.0–10.5)

## 2015-09-25 LAB — CULTURE, BLOOD (ROUTINE X 2): Culture: NO GROWTH

## 2015-09-25 LAB — COMPREHENSIVE METABOLIC PANEL
ALK PHOS: 72 U/L (ref 38–126)
ALT: 8 U/L — AB (ref 14–54)
AST: 15 U/L (ref 15–41)
Albumin: 1.5 g/dL — ABNORMAL LOW (ref 3.5–5.0)
Anion gap: 10 (ref 5–15)
BILIRUBIN TOTAL: 0.9 mg/dL (ref 0.3–1.2)
BUN: 24 mg/dL — AB (ref 6–20)
CALCIUM: 8.4 mg/dL — AB (ref 8.9–10.3)
CO2: 32 mmol/L (ref 22–32)
CREATININE: 1.37 mg/dL — AB (ref 0.44–1.00)
Chloride: 98 mmol/L — ABNORMAL LOW (ref 101–111)
GFR, EST AFRICAN AMERICAN: 41 mL/min — AB (ref >60)
GFR, EST NON AFRICAN AMERICAN: 35 mL/min — AB (ref >60)
Glucose, Bld: 74 mg/dL (ref 65–99)
Potassium: 4.9 mmol/L (ref 3.5–5.1)
Sodium: 140 mmol/L (ref 135–145)
TOTAL PROTEIN: 4.6 g/dL — AB (ref 6.5–8.1)

## 2015-09-25 LAB — PROTIME-INR
INR: 3.35 — AB (ref 0.00–1.49)
Prothrombin Time: 33.3 seconds — ABNORMAL HIGH (ref 11.6–15.2)

## 2015-09-25 MED ORDER — METOPROLOL TARTRATE 25 MG PO TABS
125.0000 mg | ORAL_TABLET | Freq: Two times a day (BID) | ORAL | Status: DC
  Administered 2015-09-25 – 2015-09-28 (×6): 125 mg via ORAL
  Filled 2015-09-25 (×12): qty 1

## 2015-09-25 NOTE — Progress Notes (Signed)
ANTICOAGULATION CONSULT NOTE  Pharmacy Consult for Coumadin Indication: atrial fibrillation  Allergies  Allergen Reactions  . Ambien [Zolpidem Tartrate]     unknown  . Sulfa Antibiotics Nausea And Vomiting  . Clindamycin/Lincomycin Swelling and Rash    Patient Measurements: Height: 5\' 4"  (162.6 cm) Weight: 167 lb 15.9 oz (76.2 kg) IBW/kg (Calculated) : 54.7  Vital Signs: BP: 109/48 mmHg (10/31 1300) Pulse Rate: 95 (10/31 1300)  Labs:  Recent Labs  09/23/15 0635 09/23/15 1646 09/24/15 0324 09/25/15 0630  HGB 9.5*  --  8.0* 8.3*  HCT 30.0*  --  26.7* 27.6*  PLT 234  --  212 213  LABPROT 20.3*  --  27.5* 33.3*  INR 1.73*  --  2.60* 3.35*  CREATININE 1.22*  --  1.64* 1.37*  CKTOTAL 42  --   --   --   TROPONINI  --  <0.03 <0.03  --     Estimated Creatinine Clearance: 32.7 mL/min (by C-G formula based on Cr of 1.37).   Assessment: Pt discharge 10/28, [facility] with bruised/swollen area at R temple and bruising with small laceration on R elbow. Hematomas to back of head from fall at SNF last Wednesday; head CT neg.  On warfarin PTA for afib; (5 mg MWF 2.5 mg TTSS).  INR 2.79>1.57 s/p Vit K 2.5mg  po (10/27). INR currently 3.35- has been increasing despite not being given any warfarin since 10/29.  Hgb 8.3, plts 213- no overt bleeding noted.   Goal of Therapy:  INR 2-3 Monitor platelets by anticoagulation protocol: Yes   Plan:  -hold warfarin -daily INR  Naftuli Dalsanto D. Britton Perkinson, PharmD, BCPS Clinical Pharmacist Pager: [phone] 09/25/2015 1:48 PM

## 2015-09-25 NOTE — Progress Notes (Signed)
Utilization review completed. Saif Peter, RN, BSN. 

## 2015-09-25 NOTE — Progress Notes (Signed)
[facility] TEAM 1 - Stepdown/ICU TEAM PROGRESS NOTE  Sierra Singleton ZOX:[id number] DOB: 25-Jan-1933 DOA: 09/23/2015 PCP: Ignatius Specking., MD  Admit HPI / Brief Narrative: 81 y.o. female with history of chronic respiratory failure on 2 L of oxygen, COPD, CHF, A. Fib, , hypertension, and [name] (familial benign chronic pemphigus) who presented to the ED after being found on the floor at the skilled nursing facility where she resides. Initial evaluation in the emergency department revealed acute on chronic respiratory failure with hypoxia, A. fib with RVR, Acute on chronic kidney disease, acute on chronic diastolic heart failure, and a mild COPD exacerbation.  Workup in the emergency department includes basic metabolic panel significant for chloride of 90, CO2 35 BUN 21 creatinine 1.22, complete blood count with hemoglobin of 9.5 hematocrit 30.0, BNP 451.4. INR 1.73. EKG revealed A. fib with RVR. She was afebrile and hemodynamically stable but tachycardic in the 120s to 130s. Oxygen saturation level greater than 90% on 4 L. She was provided with diltiazem loading dose and extended release capsule with little improvement therefore diltiazem drip was started.   HPI/Subjective: The patient is somnolent.  At the time of visit today she denies shortness of breath chest pain nausea vomiting or abdominal pain.  She cannot tell me where she is or why she is here at this time.  Assessment/Plan:  Acute on chronic hypoxic respiratory failure Multifactorial predominantly acute on chronic diastolic heart failure with A. fib and RVR in the setting of mild COPD exacerbation - see each issue detailed below - has now been weaned back to her baseline 2 L nasal cannula  Chronic Atrial fibrillation with actue RVR CHA2DS2 - VASc 5 - INR subtherapeutic - pharmacy to dose Coumadin - rate remains better controlled but not yet ideal - remain in step down unit has returned to IV drip may be required - adjust  oral medical care and follow   Acute on chronic grade 2 diastolic CHF Chest x-ray with pulmonary congestion - echo in March of this year EF 60% with grade 2 diastolic dysfunction - dry weight 160 pounds - presently 168 pounds - nurse has noted patient drinks very large volumes of sweet tea during the day - diurese and limit oral intake  COPD with mild acute bronchospastic exacerbation Much improved today with no appreciable wheeze on exam - continue usual therapy and follow  Acute renal failure superimposed on stage 3 chronic kidney disease  Creatinine improved today - recheck in a.m.   Fall Etiology unclear - CT head without acute abnormality - reportedly bedbound at her  facility  Macrocytic anemia No sign of active bleeding - hemoglobin appears to have stabilized at approximately 8.0  - recheck in a.m.   Hypothyroidism TSH 0.567 - continue Synthroid  [name] diagnosed 60 years ago - back side with purpura and fluid-filled blisters - request wound consult  Lung mass Workup as outpatient scheduled. Pulmonology appointment OP. PET scan to be done outpatient  Dementia? versus toxic metabolic encephalopathy  follow mental status with treatment of acute/active medical issues - check B-12 and folic acid levels   MRSA screen +  Code Status: DNR - NO CODE  Family Communication: no family present at time of exam Disposition Plan: SDU  Consultants: none  Procedures: none  Antibiotics: Minocycline - chronic use   DVT prophylaxis: warfarin  Objective: Blood pressure 109/48, pulse 95, temperature 98.3 F (36.8 C), temperature source Axillary, resp. rate 19, height  (1.626 m), weight  76.2 kg (167 lb 15.9 oz), SpO2 94 %.  Intake/Output Summary (Last 24 hours) at 09/25/15 1436 Last data filed at 09/25/15 1000  Gross per 24 hour  Intake    410 ml  Output   2000 ml  Net  -1590 ml   Exam: General: No acute respiratory distress - confused Lungs: poor air  movement in bilateral bases with no active wheezing and no focal crackles Cardiovascular: Irregularly irregular with rate approximately 110 bpm without appreciable murmur Abdomen: Nontender, nondistended, soft, bowel sounds positive, no rebound, no ascites, no appreciable mass Extremities: No significant cyanosis, or clubbing w/ 1+ edema bilateral lower extremities  Data Reviewed: Basic Metabolic Panel:  Recent Labs Lab 09/21/15 0304 09/22/15 0530 09/23/15 0635 09/24/15 0324 09/25/15 0630  NA 141 138 137 139 140  K 3.4* 3.2* 4.4 5.4* 4.9  CL 96* 89* 90* 97* 98*  CO2 34* 36* 35* 33* 32  GLUCOSE 96 82 101* 89 74  BUN 24* 17 21* 26* 24*  CREATININE 1.12* 1.15* 1.22* 1.64* 1.37*  CALCIUM 8.6* 8.4* 8.7* 8.6* 8.4*  MG 1.2* 1.5* 2.3  --   --     CBC:  Recent Labs Lab 09/20/15 0308 09/21/15 0304 09/23/15 0635 09/24/15 0324 09/25/15 0630  WBC 3.9* 5.0 8.1 5.1 4.7  NEUTROABS  --   --  5.1  --   --   HGB 8.1* 9.1* 9.5* 8.0* 8.3*  HCT 25.9* 30.0* 30.0* 26.7* 27.6*  MCV 97.7 99.3 97.4 98.9 99.3  PLT 140* 164 234 212 213    Liver Function Tests:  Recent Labs Lab 09/19/15 1634 09/25/15 0630  AST 20 15  ALT 10* 8*  ALKPHOS 67 72  BILITOT 0.5 0.9  PROT 5.7* 4.6*  ALBUMIN 1.9* 1.5*    Coags:  Recent Labs Lab 09/21/15 0304 09/22/15 0530 09/23/15 0635 09/24/15 0324 09/25/15 0630  INR 2.79* 1.57* 1.73* 2.60* 3.35*   Cardiac Enzymes:  Recent Labs Lab 09/19/15 1634 09/23/15 0635 09/23/15 1646 09/24/15 0324  CKTOTAL  --  42  --   --   TROPONINI 0.03  --  <0.03 <0.03    Recent Results (from the past 240 hour(s))  Urine culture     Status: None   Collection Time: 09/19/15  5:30 PM  Result Value Ref Range Status   Specimen Description URINE, CATHETERIZED  Final   Special Requests NONE  Final   Culture MULTIPLE SPECIES PRESENT, SUGGEST RECOLLECTION  Final   Report Status 09/20/2015 FINAL  Final  Blood Culture (routine x 2)     Status: None   Collection  Time: 09/19/15  6:28 PM  Result Value Ref Range Status   Specimen Description BLOOD LEFT ARM  Final   Special Requests IN PEDIATRIC BOTTLE 1CC  Final   Culture NO GROWTH 5 DAYS  Final   Report Status 09/24/2015 FINAL  Final  MRSA PCR Screening     Status: Abnormal   Collection Time: 09/20/15  1:42 AM  Result Value Ref Range Status   MRSA by PCR POSITIVE (A) NEGATIVE Final    Comment:        The GeneXpert MRSA Assay (FDA approved for NASAL specimens only), is one component of a comprehensive MRSA colonization surveillance program. It is not intended to diagnose MRSA infection nor to guide or monitor treatment for MRSA infections. RESULT CALLED TO, READ BACK BY AND VERIFIED WITH: M INGRAM@0415  09/20/15 MKELLY   Blood Culture (routine x 2)     Status: None  Collection Time: 09/20/15  3:08 AM  Result Value Ref Range Status   Specimen Description BLOOD LEFT ANTECUBITAL  Final   Special Requests BOTTLES DRAWN AEROBIC ONLY 4CC  Final   Culture NO GROWTH 5 DAYS  Final   Report Status 09/25/2015 FINAL  Final  MRSA PCR Screening     Status: Abnormal   Collection Time: 09/23/15  9:58 AM  Result Value Ref Range Status   MRSA by PCR POSITIVE (A) NEGATIVE Final    Comment:        The GeneXpert MRSA Assay (FDA approved for NASAL specimens only), is one component of a comprehensive MRSA colonization surveillance program. It is not intended to diagnose MRSA infection nor to guide or monitor treatment for MRSA infections. RESULT CALLED TO, READ BACK BY AND VERIFIED WITH: D MUHORO,RN AT 1252 09/23/15 BY K BARR      Studies:   Recent x-ray studies have been reviewed in detail by the Attending Physician  Scheduled Meds:  Scheduled Meds: . atorvastatin  20 mg Oral q1800  . Chlorhexidine Gluconate Cloth  6 each Topical Q0600  . diltiazem  180 mg Oral BID  . divalproex  125 mg Oral BID  . fentaNYL  12.5 mcg Transdermal Q72H  . ferrous sulfate  325 mg Oral Daily  . folic acid  1  mg Oral Daily  . furosemide  60 mg Intravenous BID  . ipratropium  0.5 mg Nebulization QID  . levalbuterol  0.63 mg Nebulization QID  . levothyroxine  125 mcg Oral QAC breakfast  . methotrexate  10 mg Oral Q Sun  . metoprolol tartrate  100 mg Oral BID  . minocycline  100 mg Oral BID  . mupirocin ointment  1 application Nasal BID  . traZODone  100 mg Oral QHS  . Warfarin - Pharmacist Dosing Inpatient   Does not apply q1800    Time spent on care of this patient: 35 mins   [facility] Mary'S Medical CenterMCCLUNG,Joseandres Mazer T , MD   [facility] Office  [phone] Pager - Text Page per Amion as per below:  On-Call/Text Page:      [email or web address]      password [email or web address]  If 7PM-7AM, please contact night-coverage [email or web address] Password [email or web address] 09/25/2015, 2:36 PM   LOS: 2 days

## 2015-09-25 NOTE — Clinical Social Work Note (Signed)
Clinical Social Work Assessment  Patient Details  Name: Sierra KalesShelby J Mich MRN: [id number] Date of Birth: [birth year]  Date of referral:  09/25/15               Reason for consult:  Facility Placement                Permission sought to share information with:  Facility Medical sales representativeContact Representative, Family Supports Permission granted to share information::     Name::     Lucita FerraraBarry Glossom  Agency::  [facility]  Relationship::  son  Contact Information:     Housing/Transportation Living arrangements for the past 2 months:  [facility] Source of Information:  Adult Children Patient Interpreter Needed:  None Criminal Activity/Legal Involvement Pertinent to Current Situation/Hospitalization:  No - Comment as needed Significant Relationships:  Adult Children Lives with:  Facility Resident Do you feel safe going back to the place where you live?    Need for family participation in patient care:  Yes (Comment) (decision making)  Care giving concerns: none- [facility]   Social Worker assessment / plan:  CSW assessed with pt son for return to [facility] at time of DC  Employment status:  Retired Database administratornsurance information:  Managed Medicare PT Recommendations:  Not assessed at this time Information / Referral to community resources:  Skilled Nursing Facility  Patient/Family's Response to care:  Pt son agreeable to pt return to SNF when ready  Patient/Family's Understanding of and Emotional Response to Diagnosis, Current Treatment, and Prognosis:  Good understanding- no questions or concerns  Emotional Assessment Appearance:  Appears stated age Attitude/Demeanor/Rapport:  Unable to Assess Affect (typically observed):  Unable to Assess Orientation:  Oriented to Self Alcohol / Substance use:  Not Applicable Psych involvement (Current and /or in the community):  No (Comment)  Discharge Needs  Concerns to be addressed:  Care Coordination Readmission within the last 30 days:   Yes Current discharge risk:  None Barriers to Discharge:  Continued Medical Work up   Peabody EnergyHoloman, Kasmira Cacioppo M, LCSW 09/25/2015, 2:15 PM

## 2015-09-25 NOTE — NC FL2 (Signed)
[place] MEDICAID FL2 LEVEL OF CARE SCREENING TOOL     IDENTIFICATION  Patient Name: Sierra Singleton Birthdate: 27-Apr-1933 Sex: female Admission Date (Current Location): 09/23/2015  [facility] and [place] Number: Producer, television/film/video and Address:  [facility], [address] [place], [place] [address]      Provider Number: [id number]  Attending Physician Name and Address:  Lonia Blood, MD  Relative Name and Phone Number:       Current Level of Care: Hospital Recommended Level of Care: Skilled Nursing Facility Prior Approval Number:    Date Approved/Denied:   PASRR Number:    Discharge Plan: SNF    Current Diagnoses: Patient Active Problem List   Diagnosis Date Noted  . Acute renal failure superimposed on stage 3 chronic kidney disease (HCC) 09/23/2015  . Fall 09/23/2015  . Delirium 09/23/2015  . Pressure ulcer 09/21/2015  . Acute on chronic respiratory failure (HCC) 09/20/2015  . Atrial fibrillation with RVR (HCC) 09/19/2015  . Lung mass 04/18/2015  . Symptomatic anemia 04/05/2015  . Pubic ramus fracture (HCC) 03/23/2015  . Fracture of multiple pubic rami (HCC) 03/23/2015  . Chronic diastolic CHF (congestive heart failure) (HCC) 03/23/2015  . Protein-calorie malnutrition (HCC) 03/23/2015  . CKD (chronic kidney disease) 03/23/2015  . 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 Atrial fibrillation (HCC) 02/11/2015  . Chronic respiratory failure with hypoxia (HCC) 02/11/2015  . Macrocytic anemia 02/09/2015  . Hypothyroidism 02/09/2015  . COPD (chronic obstructive pulmonary disease) (HCC) 12/29/2014    Orientation ACTIVITIES/SOCIAL BLADDER RESPIRATION    Self    Indwelling catheter, Incontinent O2 (As needed) (2L)  BEHAVIORAL SYMPTOMS/MOOD NEUROLOGICAL BOWEL NUTRITION STATUS      Continent Diet (cardiac)  PHYSICIAN VISITS COMMUNICATION OF NEEDS Height & Weight Skin    Verbally  (162.6 cm) 167 lbs. PU Stage and Appropriate Care,  Other (Comment) (Deep tissue injury: buttocks)          AMBULATORY STATUS RESPIRATION    Assist extensive O2 (As needed) (2L)      Personal Care Assistance Level of Assistance  Total care Bathing Assistance: Maximum assistance Feeding assistance: Limited assistance Dressing Assistance: Maximum assistance Total Care Assistance: Maximum assistance    Functional Limitations Info                SPECIAL CARE FACTORS FREQUENCY                      Additional Factors Info  Psychotropic, Isolation Precautions, Code Status Code Status Info: DNR   Psychotropic Info: depakote   Isolation Precautions Info: MRSA     Current Medications (09/25/2015): Current Facility-Administered Medications  Medication Dose Route Frequency Provider Last Rate Last Dose  . 0.9 %  sodium chloride infusion   Intravenous Continuous Lonia Blood, MD 10 mL/hr at 09/24/15 1400 10 mL/hr at 09/24/15 1400  . acetaminophen (TYLENOL) tablet 650 mg  650 mg Oral Q6H PRN Gwenyth Bender, NP       Or  . acetaminophen (TYLENOL) suppository 650 mg  650 mg Rectal Q6H PRN Gwenyth Bender, NP      . atorvastatin (LIPITOR) tablet 20 mg  20 mg Oral q1800 Gwenyth Bender, NP   20 mg at 09/24/15 1822  . bisacodyl (DULCOLAX) suppository 10 mg  10 mg Rectal Daily PRN Gwenyth Bender, NP      . Chlorhexidine Gluconate Cloth 2 % PADS 6 each  6 each Topical Q0600  Rosalin HawkingZeba Anwar, MD   6 each at 09/25/15 0600  . diltiazem (CARDIZEM CD) 24 hr capsule 180 mg  180 mg Oral BID Lonia BloodJeffrey T McClung, MD   180 mg at 09/25/15 1018  . divalproex (DEPAKOTE SPRINKLE) capsule 125 mg  125 mg Oral BID Gwenyth BenderKaren M Black, NP   125 mg at 09/25/15 1018  . fentaNYL (DURAGESIC - dosed mcg/hr) 12.5 mcg  12.5 mcg Transdermal Q72H Lesle ChrisKaren M Black, NP   12.5 mcg at 09/25/15 1018  . ferrous sulfate tablet 325 mg  325 mg Oral Daily Lesle ChrisKaren M Black, NP   325 mg at 09/25/15 1018  . folic acid (FOLVITE) tablet 1 mg  1 mg Oral Daily Lesle ChrisKaren M Black, NP   1 mg at 09/25/15  1018  . furosemide (LASIX) injection 60 mg  60 mg Intravenous BID Lonia BloodJeffrey T McClung, MD   60 mg at 09/25/15 0808  . HYDROcodone-acetaminophen (NORCO/VICODIN) 5-325 MG per tablet 1 tablet  1 tablet Oral Q4H PRN Gwenyth BenderKaren M Black, NP   1 tablet at 09/25/15 [phone]  . hydrOXYzine (ATARAX/VISTARIL) tablet 25 mg  25 mg Oral TID PRN Gwenyth BenderKaren M Black, NP   25 mg at 09/25/15 0809  . ipratropium (ATROVENT) nebulizer solution 0.5 mg  0.5 mg Nebulization QID Lonia BloodJeffrey T McClung, MD   0.5 mg at 09/25/15 1146  . levalbuterol (XOPENEX) nebulizer solution 0.63 mg  0.63 mg Nebulization QID Lonia BloodJeffrey T McClung, MD   0.63 mg at 09/25/15 1146  . levothyroxine (SYNTHROID, LEVOTHROID) tablet 125 mcg  125 mcg Oral QAC breakfast Gwenyth BenderKaren M Black, NP   125 mcg at 09/25/15 [phone]  . LORazepam (ATIVAN) tablet 1 mg  1 mg Oral Q12H PRN Gwenyth BenderKaren M Black, NP   1 mg at 09/23/15 1038  . methotrexate (RHEUMATREX) tablet 10 mg  10 mg Oral Q Margie EgeSun Zeba Anwar, MD   10 mg at 09/24/15 0912  . metoprolol (LOPRESSOR) tablet 100 mg  100 mg Oral BID Lonia BloodJeffrey T McClung, MD   100 mg at 09/25/15 1018  . minocycline (MINOCIN,DYNACIN) capsule 100 mg  100 mg Oral BID Gwenyth BenderKaren M Black, NP   100 mg at 09/25/15 1018  . mupirocin ointment (BACTROBAN) 2 % 1 application  1 application Nasal BID Rosalin HawkingZeba Anwar, MD   1 application at 09/25/15 0816  . ondansetron (ZOFRAN) tablet 4 mg  4 mg Oral Q6H PRN Gwenyth BenderKaren M Black, NP       Or  . ondansetron [facility](ZOFRAN) injection 4 mg  4 mg Intravenous Q6H PRN Gwenyth BenderKaren M Black, NP      . senna-docusate (Senokot-S) tablet 1 tablet  1 tablet Oral QHS PRN Gwenyth BenderKaren M Black, NP      . traZODone (DESYREL) tablet 100 mg  100 mg Oral QHS Gwenyth BenderKaren M Black, NP   100 mg at 09/24/15 2200  . Warfarin - Pharmacist Dosing Inpatient   Does not apply [address] Norva PavlovCrystal S Robertson, [facility] - EastRPH   Stopped at 09/24/15 1800   Do not use this list as official medication orders. Please verify with discharge summary.  Discharge Medications:   Medication List    ASK your doctor about these  medications        acetaminophen 650 MG CR tablet  Commonly known as:  TYLENOL  Take 650 mg by mouth every 8 (eight) hours as needed for pain.     atorvastatin 20 MG tablet  Commonly known as:  LIPITOR  Take 20 mg by mouth daily at 6 PM.  calcitonin (salmon) 200 UNIT/ACT nasal spray  Commonly known as:  MIACALCIN/FORTICAL  Place 1 spray into alternate nostrils daily.     cloNIDine 0.1 MG tablet  Commonly known as:  CATAPRES  Take 0.1 mg by mouth 2 (two) times daily as needed (Take only if Blood pressure greater than 200).     cyanocobalamin 500 MCG tablet  Place 500 mcg under the tongue daily.     diltiazem 180 MG 24 hr capsule  Commonly known as:  CARDIZEM CD  Take 1 capsule (180 mg total) by mouth 2 (two) times daily.     divalproex 125 MG capsule  Commonly known as:  DEPAKOTE SPRINKLE  Take 125 mg by mouth 2 (two) times daily.     feeding supplement (ENSURE ENLIVE) Liqd  Take 237 mLs by mouth 2 (two) times daily between meals.     fentaNYL 12 MCG/HR  Commonly known as:  DURAGESIC - dosed mcg/hr  Place 1 patch (12.5 mcg total) onto the skin every 3 (three) days.     ferrous sulfate 325 (65 FE) MG tablet  Take 325 mg by mouth daily.     folic acid 1 MG tablet  Commonly known as:  FOLVITE  Take 1 mg by mouth daily.     furosemide 40 MG tablet  Commonly known as:  LASIX  Take 40 mg by mouth daily.     HYDROcodone-acetaminophen 5-325 MG tablet  Commonly known as:  NORCO/VICODIN  Take 1 tablet by mouth every 4 (four) hours as needed for moderate pain.     hydrOXYzine 25 MG tablet  Commonly known as:  ATARAX/VISTARIL  Take 25 mg by mouth at bedtime.     hydrOXYzine 25 MG tablet  Commonly known as:  ATARAX/VISTARIL  Take 25 mg by mouth 3 (three) times daily as needed for itching.     levalbuterol 0.63 MG/3ML nebulizer solution  Commonly known as:  XOPENEX  Take 3 mLs (0.63 mg total) by nebulization every 6 (six) hours as needed for wheezing or shortness of  breath.     levothyroxine 125 MCG tablet  Commonly known as:  SYNTHROID, LEVOTHROID  Take 125 mcg by mouth daily before breakfast.     loratadine 10 MG tablet  Commonly known as:  CLARITIN  Take 10 mg by mouth daily.     LORazepam 1 MG tablet  Commonly known as:  ATIVAN  Take 1 tablet (1 mg total) by mouth every 12 (twelve) hours as needed for anxiety.     methotrexate 2.5 MG tablet  Take 10 mg by mouth once a week. On Sunday.     metolazone 2.5 MG tablet  Commonly known as:  ZAROXOLYN  Take 2.5 mg by mouth every Monday.     metoprolol tartrate 25 MG tablet  Commonly known as:  LOPRESSOR  Take 3 tablets (75 mg total) by mouth 2 (two) times daily.     minocycline 100 MG capsule  Commonly known as:  MINOCIN,DYNACIN  Take 100 mg by mouth 2 (two) times daily.     OXYGEN  Inhale 2 L into the lungs continuous.     potassium chloride SA 20 MEQ tablet  Commonly known as:  K-DUR,KLOR-CON  Take 20 mEq by mouth 3 (three) times daily. For 3 days     traZODone 100 MG tablet  Commonly known as:  DESYREL  Take 100 mg by mouth at bedtime.     VIIBRYD 20 MG Tabs  Generic drug:  Vilazodone HCl  Take 20 mg by mouth daily.     Vitamin D (Ergocalciferol) 50000 UNITS Caps capsule  Commonly known as:  DRISDOL  Take 50,000 Units by mouth every 7 (seven) days. Take on thursdays     warfarin 5 MG tablet  Commonly known as:  COUMADIN  Take 5 mg by mouth every Monday, Wednesday, and Friday at 6 PM. Take 1 tablet every Monday, Wednesday, and Friday     warfarin 2.5 MG tablet  Commonly known as:  COUMADIN  Take 2.5 mg by mouth every Tuesday, Thursday, Saturday, and Sunday at 6 PM.        Relevant Imaging Results:  Relevant Lab Results:  Recent Labs    Additional Information    Lorenzo, Binnie Rail, LCSW

## 2015-09-26 DIAGNOSIS — I429 Cardiomyopathy, unspecified: Secondary | ICD-10-CM

## 2015-09-26 DIAGNOSIS — J9621 Acute and chronic respiratory failure with hypoxia: Secondary | ICD-10-CM | POA: Diagnosis present

## 2015-09-26 DIAGNOSIS — J441 Chronic obstructive pulmonary disease with (acute) exacerbation: Secondary | ICD-10-CM | POA: Diagnosis present

## 2015-09-26 DIAGNOSIS — R296 Repeated falls: Secondary | ICD-10-CM | POA: Diagnosis present

## 2015-09-26 DIAGNOSIS — I5033 Acute on chronic diastolic (congestive) heart failure: Secondary | ICD-10-CM | POA: Diagnosis present

## 2015-09-26 DIAGNOSIS — R918 Other nonspecific abnormal finding of lung field: Secondary | ICD-10-CM

## 2015-09-26 LAB — CBC
HCT: 27.9 % — ABNORMAL LOW (ref 36.0–46.0)
HEMOGLOBIN: 8.4 g/dL — AB (ref 12.0–15.0)
MCH: 29.6 pg (ref 26.0–34.0)
MCHC: 30.1 g/dL (ref 30.0–36.0)
MCV: 98.2 fL (ref 78.0–100.0)
Platelets: 284 10*3/uL (ref 150–400)
RBC: 2.84 MIL/uL — AB (ref 3.87–5.11)
RDW: 16.3 % — ABNORMAL HIGH (ref 11.5–15.5)
WBC: 4 10*3/uL (ref 4.0–10.5)

## 2015-09-26 LAB — COMPREHENSIVE METABOLIC PANEL
ALK PHOS: 79 U/L (ref 38–126)
ALT: 9 U/L — AB (ref 14–54)
ANION GAP: 9 (ref 5–15)
AST: 17 U/L (ref 15–41)
Albumin: 1.6 g/dL — ABNORMAL LOW (ref 3.5–5.0)
BUN: 25 mg/dL — ABNORMAL HIGH (ref 6–20)
CALCIUM: 8.2 mg/dL — AB (ref 8.9–10.3)
CO2: 34 mmol/L — AB (ref 22–32)
CREATININE: 1.54 mg/dL — AB (ref 0.44–1.00)
Chloride: 92 mmol/L — ABNORMAL LOW (ref 101–111)
GFR, EST AFRICAN AMERICAN: 36 mL/min — AB (ref >60)
GFR, EST NON AFRICAN AMERICAN: 31 mL/min — AB (ref >60)
Glucose, Bld: 86 mg/dL (ref 65–99)
Potassium: 4 mmol/L (ref 3.5–5.1)
SODIUM: 135 mmol/L (ref 135–145)
Total Bilirubin: 0.7 mg/dL (ref 0.3–1.2)
Total Protein: 4.6 g/dL — ABNORMAL LOW (ref 6.5–8.1)

## 2015-09-26 LAB — IRON AND TIBC
Iron: 50 ug/dL (ref 28–170)
Saturation Ratios: 31 % (ref 10.4–31.8)
TIBC: 161 ug/dL — AB (ref 250–450)
UIBC: 111 ug/dL

## 2015-09-26 LAB — RETICULOCYTES
RBC.: 2.76 MIL/uL — ABNORMAL LOW (ref 3.87–5.11)
RETIC COUNT ABSOLUTE: 69 10*3/uL (ref 19.0–186.0)
RETIC CT PCT: 2.5 % (ref 0.4–3.1)

## 2015-09-26 LAB — FOLATE: Folate: 38.9 ng/mL (ref >5.9)

## 2015-09-26 LAB — AMMONIA: Ammonia: 25 umol/L (ref 9–35)

## 2015-09-26 LAB — FERRITIN: FERRITIN: 234 ng/mL (ref 11–307)

## 2015-09-26 LAB — PROTIME-INR
INR: 3.37 — AB (ref 0.00–1.49)
PROTHROMBIN TIME: 33.4 s — AB (ref 11.6–15.2)

## 2015-09-26 LAB — VITAMIN B12: Vitamin B-12: 618 pg/mL (ref 180–914)

## 2015-09-26 MED ORDER — IPRATROPIUM BROMIDE 0.02 % IN SOLN
0.5000 mg | Freq: Three times a day (TID) | RESPIRATORY_TRACT | Status: DC
  Administered 2015-09-26 – 2015-09-28 (×4): 0.5 mg via RESPIRATORY_TRACT
  Filled 2015-09-26 (×5): qty 2.5

## 2015-09-26 MED ORDER — LEVALBUTEROL HCL 0.63 MG/3ML IN NEBU
0.6300 mg | INHALATION_SOLUTION | Freq: Three times a day (TID) | RESPIRATORY_TRACT | Status: DC
  Administered 2015-09-26 – 2015-09-28 (×4): 0.63 mg via RESPIRATORY_TRACT
  Filled 2015-09-26 (×5): qty 3

## 2015-09-26 MED ORDER — ASPIRIN EC 325 MG PO TBEC
325.0000 mg | DELAYED_RELEASE_TABLET | Freq: Every day | ORAL | Status: DC
  Administered 2015-09-26 – 2015-09-28 (×3): 325 mg via ORAL
  Filled 2015-09-26 (×3): qty 1

## 2015-09-26 MED ORDER — FUROSEMIDE 10 MG/ML IJ SOLN
40.0000 mg | Freq: Two times a day (BID) | INTRAMUSCULAR | Status: DC
  Administered 2015-09-26 – 2015-09-27 (×2): 40 mg via INTRAVENOUS
  Filled 2015-09-26 (×2): qty 4

## 2015-09-26 MED ORDER — DIGOXIN 0.25 MG/ML IJ SOLN
0.1250 mg | Freq: Every day | INTRAMUSCULAR | Status: DC
  Administered 2015-09-26 – 2015-09-27 (×2): 0.125 mg via INTRAVENOUS
  Filled 2015-09-26 (×2): qty 2

## 2015-09-26 NOTE — Progress Notes (Signed)
Pt transferred to 3E17. Report was called to staff nurse Tiffany. Vital signs stable. All belongings were sent with the patient

## 2015-09-26 NOTE — Progress Notes (Signed)
Patient transferred from [facility] to 3E 17.  Current [facility] medically stable per MD.  CSW will monitor and assist with d/c when stable. Lorri Frederickonna T. Jaci LazierCrowder, [place]CSW [phone]

## 2015-09-26 NOTE — Progress Notes (Signed)
[place] TEAM 1 - Stepdown/ICU TEAM Progress Note  Wilnette KalesShelby J Deleon RUE:[id number] DOB: September 09, 1933 DOA: 09/23/2015 PCP: Ignatius SpeckingVYAS,DHRUV B., MD  Admit HPI / Brief Narrative: 81 y.o. WF PMHx Chronic Respiratory failure on 2 L of oxygen, COPD, CHF, A. Fib, HTN ,and [name] (familial benign chronic pemphigus) who presented to the ED after being found on the floor at the skilled nursing facility where she resides. Initial evaluation in the emergency department revealed acute on chronic respiratory failure with hypoxia, A. fib with RVR, Acute on chronic kidney disease, acute on chronic diastolic heart failure, and a mild COPD exacerbation.  Workup in the emergency department includes basic metabolic panel significant for chloride of 90, CO2 35 BUN 21 creatinine 1.22, complete blood count with hemoglobin of 9.5 hematocrit 30.0, BNP 451.4. INR 1.73. EKG revealed A. fib with RVR. She was afebrile and hemodynamically stable but tachycardic in the 120s to 130s. Oxygen saturation level greater than 90% on 4 L. She was provided with diltiazem loading dose and extended release capsule with little improvement therefore diltiazem drip was started.  NOTE; patient just discharged on 10/28 for similar problems.  HPI/Subjective: 11/1 A/O 3 (significant confusion as to why), complains of rib and shoulder pain on the right side especially with inspiration, negative CP, positive back pain (chronic?)   Assessment/Plan:  Acute on chronic hypoxic respiratory failure -Multifactorial predominantly acute on chronic diastolic heart failure with A. fib and RVR in the setting of mild COPD exacerbation -Now on 2 L O2 via [place] with SPO2~97%. This is baseline O2 use  Chronic Atrial fibrillation with actue RVR -CHA2DS2 - VASc 5 - INR subtherapeutic  - See recurrent fall  -Patient's BP borderline for an 81 year old and with recurrent falls would not add additional Cardizem. -Will try low-dose Digoxin 0.125 mg  daily  Acute on chronic grade 2 diastolic CHF/Cardiomyopathy -CXR  c/w pulmonary congestion - echo in March of this year EF 60% with grade 2 diastolic dysfunction - dry weight was 72.5 kg ,presently 168 pounds (76.6 kg). -Patient was being diuresed, however increasing Cr  -Strict in and out; since admission 11/1= -973ml -Daily weight (dry weight 72.5 kg); 11/1 weight= 76.6 kg, patient's Cr trending up believe we might have reach patient's new dry weight. -Decrease Lasix to 40 mg BID  COPD with mild acute bronchospastic exacerbation -Much improved today with no appreciable wheeze on exam - continue usual therapy and follow  Acute renal failure superimposed on stage 3 chronic kidney disease  -Cr trending up see acute on chronic CHF    Recurrent Fall -Etiology unclear most likely multifactorial to include deconditioning, A. fib with RVR?, Orthostatic hypotension? -Patient refuses to participate with physical therapy and occupational therapy therefore unable to fully evaluate patient's needs. -DC A.m. to [facility]  -CXR showed negative fractures -With recurrent falls would DC warfarin and send patient home on aspirin 325 mg daily. Spoke with Youlanda RoysBarry Glosson (son) HCPOA and explained to him the risks and benefits of continuing warfarin vs risk and benefits of placing mother on full dose aspirin. Will DC warfarin and start patient on full dose aspirin daily secondary to her frequent falls.  Macrocytic anemia -No sign of active bleeding - hemoglobin appears to have stabilized at approximately 8.0 - recheck in a.m.   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 Pulmonology appointment OP. PET  scan to be done outpatient  Dementia? versus toxic metabolic encephalopathy  -follow mental status with treatment of acute/active  medical issues  - B-12 and folic acid levels of WNL   Goals of care -Palliative care consult placed; Patient with frequent hospitalizations please discuss short-term and long-term goals of care   Code Status: FULL Family Communication: Spoke on phone with Youlanda Roys (son) HCPOA at time of exam Disposition Plan: [facility] in next 24-48 hrs.    Consultants:   Procedure/Significant Events: 3/18 echocardiogram;Left ventricle: mild concentric hypertrophy. LVEF= 60% to 65%.  -(grade 2 diastolic dysfunction). - Mitral valve:mild to moderate regurgitation.- Left atrium: moderately to severely dilated. - Right ventricle: mildly dilated. - Right atrium: mildly to moderately dilated. 10/29 CT head without contrast; negative for traumatic injury, negative CVA, soft tissue injuries to head right orbital area 10/29 CXR; negative fractures., Soft tissue injuries   Culture None  Antibiotics: Minocycline - chronic use   DVT prophylaxis: Aspirin 325 mg + SCD   Devices    LINES / TUBES:      Continuous Infusions: . sodium chloride 10 mL/hr (09/24/15 1400)    Objective: VITAL SIGNS: Temp: 97.8 F (36.6 C) (11/01 0400) Temp Source: Oral (11/01 0400) BP: 124/46 mmHg (11/01 0400) Pulse Rate: 57 (11/01 0400) SPO2; FIO2:   Intake/Output Summary (Last 24 hours) at 09/26/15 0752 Last data filed at 09/26/15 0012  Gross per 24 hour  Intake    480 ml  Output   1750 ml  Net  -1270 ml     Exam: General: A/O 3 (significant confusion as to why) , NAD, No acute respiratory distress Eyes: Negative headache, eye pain, double vision, negative scleral hemorrhage ENT: Negative Runny nose, negative ear pain, negative gingival bleeding, Neck:  Negative scars, masses, torticollis, lymphadenopathy, multiple hematomas on the right neck and shoulder area, JVD Lungs: Clear to auscultation bilaterally without wheezes or crackles Cardiovascular: Irregular irregular rhythm and rate, without  murmur gallop or rub normal S1 and S2 Abdomen:negative abdominal pain, nondistended, positive soft, bowel sounds, no rebound, no ascites, no appreciable mass Extremities: No significant cyanosis, clubbing, or edema bilateral lower extremities, multiple lacerations and bruising on bilateral legs and arms Psychiatric:  Negative depression, negative anxiety, negative fatigue, negative mania  Neurologic:  Cranial nerves II through XII intact, tongue/uvula midline, all extremities muscle strength 5/5, sensation intact throughout, negative dysarthria, negative expressive aphasia, negative receptive aphasia. Skin; she has multiple contusions, hematomas, lacerations on right face shoulders, bilateral arms bilateral lower extremities, right lateral thoracic area.   Data Reviewed: Basic Metabolic Panel:  Recent Labs Lab 09/21/15 0304 09/22/15 0530 09/23/15 0635 09/24/15 0324 09/25/15 0630 09/26/15 0335  NA 141 138 137 139 140 135  K 3.4* 3.2* 4.4 5.4* 4.9 4.0  CL 96* 89* 90* 97* 98* 92*  CO2 34* 36* 35* 33* 32 34*  GLUCOSE 96 82 101* 89 74 86  BUN 24* 17 21* 26* 24* 25*  CREATININE 1.12* 1.15* 1.22* 1.64* 1.37* 1.54*  CALCIUM 8.6* 8.4* 8.7* 8.6* 8.4* 8.2*  MG 1.2* 1.5* 2.3  --   --   --    Liver Function Tests:  Recent Labs Lab 09/19/15 1634 09/25/15 0630 09/26/15 0335  AST ALT 10* 8* 9*  ALKPHOS 67 72 79  BILITOT 0.5 0.9 0.7  PROT 5.7* 4.6* 4.6*  ALBUMIN 1.9* 1.5* 1.6*   No results for input(s): LIPASE, AMYLASE in the last 168 hours.  Recent Labs Lab 09/26/15 0335  AMMONIA 25   CBC:  Recent Labs Lab 09/21/15 0304 09/23/15 0635 09/24/15 0324 09/25/15 0630 09/26/15 0335  WBC 5.0 8.1 5.1 4.7 4.0  NEUTROABS  --  5.1  --   --   --   HGB 9.1* 9.5* 8.0* 8.3* 8.4*  HCT 30.0* 30.0* 26.7* 27.6* 27.9*  MCV 99.3 97.4 98.9 99.3 98.2  PLT 164 234 212 213 284   Cardiac Enzymes:  Recent Labs Lab 09/19/15 1634 09/23/15 0635 09/23/15 1646 09/24/15 0324   CKTOTAL  --  42  --   --   TROPONINI 0.03  --  <0.03 <0.03   BNP (last 3 results)  Recent Labs  12/29/14 1258 02/09/15 1030 09/23/15 0635  BNP 593.0* 676.0* 451.4*    ProBNP (last 3 results) No results for input(s): PROBNP in the last 8760 hours.  CBG: No results for input(s): GLUCAP in the last 168 hours.  Recent Results (from the past 240 hour(s))  Urine culture     Status: None   Collection Time: 09/19/15  5:30 PM  Result Value Ref Range Status   Specimen Description URINE, CATHETERIZED  Final   Special Requests NONE  Final   Culture MULTIPLE SPECIES PRESENT, SUGGEST RECOLLECTION  Final   Report Status 09/20/2015 FINAL  Final  Blood Culture (routine x 2)     Status: None   Collection Time: 09/19/15  6:28 PM  Result Value Ref Range Status   Specimen Description BLOOD LEFT ARM  Final   Special Requests IN PEDIATRIC BOTTLE 1CC  Final   Culture NO GROWTH 5 DAYS  Final   Report Status 09/24/2015 FINAL  Final  MRSA PCR Screening     Status: Abnormal   Collection Time: 09/20/15  1:42 AM  Result Value Ref Range Status   MRSA by PCR POSITIVE (A) NEGATIVE Final    Comment:        The GeneXpert MRSA Assay (FDA approved for NASAL specimens only), is one component of a comprehensive MRSA colonization surveillance program. It is not intended to diagnose MRSA infection nor to guide or monitor treatment for MRSA infections. RESULT CALLED TO, READ BACK BY AND VERIFIED WITH: M INGRAM@0415  09/20/15 MKELLY   Blood Culture (routine x 2)     Status: None   Collection Time: 09/20/15  3:08 AM  Result Value Ref Range Status   Specimen Description BLOOD LEFT ANTECUBITAL  Final   Special Requests BOTTLES DRAWN AEROBIC ONLY 4CC  Final   Culture NO GROWTH 5 DAYS  Final   Report Status 09/25/2015 FINAL  Final  MRSA PCR Screening     Status: Abnormal   Collection Time: 09/23/15  9:58 AM  Result Value Ref Range Status   MRSA by PCR POSITIVE (A) NEGATIVE Final    Comment:         The GeneXpert MRSA Assay (FDA approved for NASAL specimens only), is one component of a comprehensive MRSA colonization surveillance program. It is not intended to diagnose MRSA infection nor to guide or monitor treatment for MRSA infections. RESULT CALLED TO, READ BACK BY AND VERIFIED WITH: D MUHORO,RN AT 1252 09/23/15 BY K BARR      Studies:  Recent x-ray studies have been reviewed in detail by the Attending Physician  Scheduled Meds:  Scheduled Meds: . atorvastatin  20 mg Oral q1800  . Chlorhexidine Gluconate Cloth  6 each Topical Q0600  . diltiazem  180 mg Oral BID  . divalproex  125 mg Oral BID  . fentaNYL  12.5 mcg Transdermal Q72H  . ferrous sulfate  325 mg Oral Daily  . folic acid  1 mg Oral Daily  . furosemide  60 mg Intravenous BID  . ipratropium  0.5 mg Nebulization QID  . levalbuterol  0.63 mg Nebulization QID  . levothyroxine  125 mcg Oral QAC breakfast  . methotrexate  10 mg Oral Q Sun  . metoprolol tartrate  125 mg Oral BID  . minocycline  100 mg Oral BID  . mupirocin ointment  1 application Nasal BID  . traZODone  100 mg Oral QHS  . Warfarin - Pharmacist Dosing Inpatient   Does not apply q1800    Time spent on care of this patient: 40 mins   Nerea Bordenave, Roselind Messier , MD  [facility] Office  [phone] Pager - [phone]  On-Call/Text Page:      [email or web address]      password [email or web address]  If 7PM-7AM, please contact night-coverage [email or web address] Password [email or web address] 09/26/2015, 7:52 AM   LOS: 3 days   Care during the described time interval was provided by me .  I have reviewed this patient's available data, including medical history, events of note, physical examination, and all test results as part of my evaluation. I have personally reviewed and interpreted all radiology studies.   Carolyne Littles, MD [phone] Pager

## 2015-09-26 NOTE — Progress Notes (Signed)
ANTICOAGULATION CONSULT NOTE  Pharmacy Consult for Coumadin Indication: atrial fibrillation  Allergies  Allergen Reactions  . Ambien [Zolpidem Tartrate]     unknown  . Sulfa Antibiotics Nausea And Vomiting  . Clindamycin/Lincomycin Swelling and Rash    Patient Measurements: Height: 5\' 4"  (162.6 cm) Weight: 168 lb 14 oz (76.6 kg) IBW/kg (Calculated) : 54.7  Vital Signs: Temp: 97.7 F (36.5 C) (11/01 0809) Temp Source: Oral (11/01 0809) BP: 112/43 mmHg (11/01 1040) Pulse Rate: 109 (11/01 1040)  Labs:  Recent Labs  09/23/15 1646  09/24/15 0324 09/25/15 0630 09/26/15 0335  HGB  --   < > 8.0* 8.3* 8.4*  HCT  --   --  26.7* 27.6* 27.9*  PLT  --   --  212 213 284  LABPROT  --   --  27.5* 33.3* 33.4*  INR  --   --  2.60* 3.35* 3.37*  CREATININE  --   --  1.64* 1.37* 1.54*  TROPONINI <0.03  --  <0.03  --   --   < > = values in this interval not displayed.  Estimated Creatinine Clearance: 29.2 mL/min (by C-G formula based on Cr of 1.54).   Assessment: Pt discharge 10/28[facility] with bruised/swollen area at R temple and bruising with small laceration on R elbow. Hematomas to back of head from fall at SNF last Wednesday; head CT neg.  On warfarin PTA for afib; (5 mg MWF 2.5 mg TTSS).  INR 2.79>1.57 s/p Vit K 2.5mg  po (10/27). INR currently 3.37- has been increasing despite not being given any warfarin since 10/29.  Hgb 8.4, plts 284- no overt bleeding noted.   Goal of Therapy:  INR 2-3 Monitor platelets by anticoagulation protocol: Yes   Plan:  -hold warfarin -daily INR  Jamon Hayhurst D. [place], PharmD, BCPS Clinical Pharmacist Pager: [phone] 09/26/2015 11:31 AM

## 2015-09-27 DIAGNOSIS — N183 Chronic kidney disease, stage 3 (moderate): Secondary | ICD-10-CM

## 2015-09-27 DIAGNOSIS — Z66 Do not resuscitate: Secondary | ICD-10-CM | POA: Diagnosis present

## 2015-09-27 DIAGNOSIS — J9621 Acute and chronic respiratory failure with hypoxia: Secondary | ICD-10-CM

## 2015-09-27 DIAGNOSIS — Z515 Encounter for palliative care: Secondary | ICD-10-CM

## 2015-09-27 DIAGNOSIS — I5033 Acute on chronic diastolic (congestive) heart failure: Secondary | ICD-10-CM

## 2015-09-27 LAB — PROTIME-INR
INR: 3.26 — ABNORMAL HIGH (ref 0.00–1.49)
PROTHROMBIN TIME: 32.6 s — AB (ref 11.6–15.2)

## 2015-09-27 MED ORDER — DIGOXIN 125 MCG PO TABS
0.0625 mg | ORAL_TABLET | Freq: Every day | ORAL | Status: DC
  Administered 2015-09-28: 0.0625 mg via ORAL
  Filled 2015-09-27: qty 1

## 2015-09-27 MED ORDER — FUROSEMIDE 40 MG PO TABS
40.0000 mg | ORAL_TABLET | Freq: Two times a day (BID) | ORAL | Status: DC
  Administered 2015-09-27 – 2015-09-28 (×2): 40 mg via ORAL
  Filled 2015-09-27 (×2): qty 1

## 2015-09-27 MED ORDER — DIVALPROEX SODIUM 125 MG PO CSDR
250.0000 mg | DELAYED_RELEASE_CAPSULE | Freq: Two times a day (BID) | ORAL | Status: DC
  Administered 2015-09-27 – 2015-09-28 (×2): 250 mg via ORAL
  Filled 2015-09-27 (×2): qty 2

## 2015-09-27 NOTE — Progress Notes (Signed)
Pt a/forgetful, c/o generalized pain PRN Vicodin given with good affect, VSS, pt stable

## 2015-09-27 NOTE — Progress Notes (Addendum)
Physical Therapy Treatment Patient Details Name: Sierra Singleton MRN: [id number] DOB: 05/24/1933 Today's Date: 09/27/2015    History of Present Illness HPI: Sierra Singleton is a 81 y.o. female with past medical history of chronic respiratory failure on 2 L of oxygen, COPD, CHF, A. Fib, , hypertension, [name] presents to the emergency department after being found on the floor at the skilled nursing facility where she resides. Initial evaluation in the emergency department reveals acute on chronic respiratory failure with hypoxia, A. fib with RVR, Acute on chronic kidney disease, acute on chronic diastolic heart failure with mild COPD exacerbation.    PT Comments    Pt continues to require +2, but was able to scoot with min/guard.  Pt is fearful of falling. Con't to recommend returning to SNF.  Follow Up Recommendations  SNF     Equipment Recommendations  None recommended by PT    Recommendations for Other Services       Precautions / Restrictions Precautions Precautions: Fall Restrictions Weight Bearing Restrictions: No    Mobility  Bed Mobility Overal bed mobility: Needs Assistance;+2 for physical assistance Bed Mobility: Rolling;Supine to Sit;Sit to Supine Rolling: Mod assist   Supine to sit: +2 for physical assistance;Max assist Sit to supine: Mod assist;+2 for physical assistance   General bed mobility comments: Pt able to bridge and attempting to assist with sit >supine but difficulty following instructions.  Transfers Overall transfer level: Needs assistance Equipment used: 2 person hand held assist Transfers: Sit to/from Stand Sit to Stand: Mod assist;+2 physical assistance         General transfer comment: Pt stood with MOD of 2, but was able to do a semi stand with min/guard and scoot self on EOB  Ambulation/Gait Ambulation/Gait assistance: Mod assist;+2 physical assistance Ambulation Distance (Feet): 2 Feet Assistive device: 2 person hand  held assist Gait Pattern/deviations: Step-to pattern     General Gait Details: side stepping to [facility] with cueing to decrease anxiety and for technique   Stairs            Wheelchair Mobility    Modified Rankin (Stroke Patients Only)       Balance     Sitting balance-Leahy Scale: Fair       Standing balance-Leahy Scale: Poor                      Cognition Arousal/Alertness: Awake/alert Behavior During Therapy: WFL for tasks assessed/performed Overall Cognitive Status: History of cognitive impairments - at baseline                      Exercises      General Comments General comments (skin integrity, edema, etc.): Pt required frequent re-direction during session.  Pt fearful of falling.      Pertinent Vitals/Pain Pain Assessment: Faces Faces Pain Scale: Hurts even more Pain Location: back Pain Descriptors / Indicators: Discomfort;Grimacing Pain Intervention(s): Monitored during session;Limited activity within patient's tolerance    Home Living                      Prior Function            PT Goals (current goals can now be found in the care plan section) Acute Rehab PT Goals Patient Stated Goal: to get some sweet tea before she dies PT Goal Formulation: With family Time For Goal Achievement: 10/08/15 Potential to Achieve Goals: Fair Progress towards PT goals: Progressing toward  goals    Frequency  Min 2X/week    PT Plan Frequency updated, d/c plan remains appropriate    Co-evaluation PT/OT/SLP Co-Evaluation/Treatment: Yes Reason for Co-Treatment: For patient/therapist safety PT goals addressed during session: Mobility/safety with mobility       End of Session Equipment Utilized During Treatment: Oxygen Activity Tolerance: Patient tolerated treatment well Patient left: in bed;with call bell/phone within reach;with bed alarm set     Time: [id number] PT Time Calculation (min) (ACUTE ONLY): 33 min  Charges:   $Therapeutic Activity: 8-22 mins                    G Codes:      Marelyn Rouser LUBECK 09/27/2015, 12:22 PM

## 2015-09-27 NOTE — Clinical Documentation Improvement (Signed)
Hospitalist  Can the diagnosis of cardiomyopathy be further specified?   Type:  Alcoholic, Congestive, Constrictive, Dilated, Hypertensive, Idiopathic, Ischemic, Obstructive Hypertrophic/IHSS, Takotsubo  Due to drugs and/or external agent, including specific drug(s) or external agent(s)  Other  Clinically Undetermined  Document any associated diagnoses/conditions.  Please update your documentation within the medical record to reflect your response to this query. Thank you.  Supporting Information:(As per notes) "Acute on chronic grade 2 diastolic CHF/Cardiomyopathy "  Please exercise your independent, professional judgment when responding. A specific answer is not anticipated or expected.  Thank You, Nevin BloodgoodJoan B Tomika Eckles, RN, BSN, CCDS,Clinical Documentation Specialist:  [phone]  [phone]=[employer]- Health Information Management

## 2015-09-27 NOTE — Progress Notes (Addendum)
[facility] TEAM 1 - Stepdown/ICU TEAM PROGRESS NOTE  Sierra Singleton ZOX:[id number] DOB: 11/13/1933 DOA: 09/23/2015 PCP: Ignatius Specking., MD  Admit HPI / Brief Narrative: 81 y.o. female with history of chronic respiratory failure on 2 L of oxygen, COPD, CHF, A. Fib, , hypertension, and [name] (familial benign chronic pemphigus) who presented to the ED after being found on the floor at the skilled nursing facility where she resides. Initial evaluation in the emergency department revealed acute on chronic respiratory failure with hypoxia, A. fib with RVR, Acute on chronic kidney disease, acute on chronic diastolic heart failure, and a mild COPD exacerbation.  Workup in the emergency department included basic metabolic panel significant for CO2 35 BUN 21 creatinine 1.22, hemoglobin of 9.5 hematocrit 30.0, BNP 451.4. INR 1.73. EKG revealed A. fib with RVR. She was afebrile and hemodynamically stable but tachycardic in the 120s to 130s. Oxygen saturation level greater than 90% on 4 L. She was provided with diltiazem loading dose and extended release capsule with little improvement therefore diltiazem drip was started.   HPI/Subjective: At the time of my visit the pt is resting comfortably in bed.  She is more alert than I have seen her this admit, and is quite talkative.  She tells me she felt very sad this morning and informed staff she "wished she was dead."  Her granddaughter is at the bedside and confirms this, and also lets me know the family is meeting w/ Palliative Care.  I was not previously alerted to this development.  The pt o/w deneis cp, sob, n/v, or abdom pain at this time.    Assessment/Plan:  Acute on chronic hypoxic respiratory failure Multifactorial predominantly acute on chronic diastolic heart failure with A. fib and RVR in the setting of mild COPD exacerbation - see each issue detailed below - has now been weaned back to her baseline 2 L nasal cannula and respirations  appear to be at baseline   Chronic Atrial fibrillation with actue RVR CHA2DS2 - VASc 5 - INR subtherapeutic - given her high fall risk the decision has been made to d/c warfarin and utilize ASA only for CVA prophylaxix - rate now well controlled   Acute on chronic grade 2 diastolic CHF Admit chest x-ray with pulmonary congestion - echo in March of this year EF 60% with grade 2 diastolic dysfunction - dry weight 160 pounds (72.6kg) - presently 72.8kg - nurse has noted patient drinks very large volumes of sweet tea during the day - net negative ~2.4L since admit   COPD with mild acute bronchospastic exacerbation Much improved with no appreciable wheeze on exam - continue usual therapy  Acute renal failure superimposed on stage 3 chronic kidney disease  Creatinine fluctuating but appears to be stable in the 1.4-1.6 range   Fall Etiology unclear - CT head without acute abnormality - reportedly bedbound at her facility - now off anticoag   Macrocytic anemia No sign of active bleeding - hemoglobin appears to have stabilized at approximately 8.0   Hypothyroidism TSH 0.567 - continue Synthroid  [name] diagnosed 60 years ago - back side with purpura and fluid-filled blisters - wound consult accomplished - WOC has nothing to add beyond her usual care   Lung mass Workup scheduled in outpt setting (Pulmonology appointment) w/ PET scan reportedly planned   Dementia? versus toxic metabolic encephalopathy  follow mental status with treatment of acute/active medical issues - check B-12 and folic acid normal - ammonia normal - CT head  at admit suggests vascular mediated dementia (mild cortical volume loss)  MRSA screen +  Code Status: DNR - NO CODE  Family Communication: no family present at time of exam [facility] Dis[facility] Plan: appears ready for transfer back to SNF [facility]   Consultants: Palliative Care    Procedures: none  Antibiotics: Minocycline - chronic use   DVT prophylaxis: warfarin  Objective: Blood pressure 125/36, pulse 70, temperature 98.6 F (37 C), temperature source Oral, resp. rate 18, height 5\' 4"  (1.626 m), weight 72.8 kg (160 lb 7.9 oz), SpO2 100 %.  Intake/Output Summary (Last 24 hours) at 09/27/15 1540 Last data filed at 09/27/15 1530  Gross per 24 hour  Intake    590 ml  Output   1450 ml  Net   -860 ml   Exam: General: No acute respiratory distress - appears comfortable but mildly confused Lungs: poor air movement in bilateral bases - no wheezing - no focal crackles Cardiovascular: Irregularly irregular with rate 70 Abdomen: Nontender, nondistended, soft, bowel sounds positive, no rebound, no ascites, no appreciable mass Extremities: No significant cyanosis, or clubbing w/ no edema bilateral lower extremities  Data Reviewed: Basic Metabolic Panel:  Recent Labs Lab 09/21/15 0304 09/22/15 0530 09/23/15 0635 09/24/15 0324 09/25/15 0630 09/26/15 0335  NA 141 138 137 139 140 135  K 3.4* 3.2* 4.4 5.4* 4.9 4.0  CL 96* 89* 90* 97* 98* 92*  CO2 34* 36* 35* 33* 32 34*  GLUCOSE 96 82 101* 89 74 86  BUN 24* 17 21* 26* 24* 25*  CREATININE 1.12* 1.15* 1.22* 1.64* 1.37* 1.54*  CALCIUM 8.6* 8.4* 8.7* 8.6* 8.4* 8.2*  MG 1.2* 1.5* 2.3  --   --   --     CBC:  Recent Labs Lab 09/21/15 0304 09/23/15 0635 09/24/15 0324 09/25/15 0630 09/26/15 0335  WBC 5.0 8.1 5.1 4.7 4.0  NEUTROABS  --  5.1  --   --   --   HGB 9.1* 9.5* 8.0* 8.3* 8.4*  HCT 30.0* 30.0* 26.7* 27.6* 27.9*  MCV 99.3 97.4 98.9 99.3 98.2  PLT 164 234 212 213 284    Liver Function Tests:  Recent Labs Lab 09/25/15 0630 09/26/15 0335  AST 15 17  ALT 8* 9*  ALKPHOS 72 79  BILITOT 0.9 0.7  PROT 4.6* 4.6*  ALBUMIN 1.5* 1.6*    Coags:  Recent Labs Lab 09/23/15 0635 09/24/15 0324 09/25/15 0630 09/26/15 0335 09/27/15 0540  INR 1.73* 2.60* 3.35* 3.37* 3.26*   Cardiac  Enzymes:  Recent Labs Lab 09/23/15 0635 09/23/15 1646 09/24/15 0324  CKTOTAL 42  --   --   TROPONINI  --  <0.03 <0.03    Recent Results (from the past 240 hour(s))  Urine culture     Status: None   Collection Time: 09/19/15  5:30 PM  Result Value Ref Range Status   Specimen Description URINE, CATHETERIZED  Final   Special Requests NONE  Final   Culture MULTIPLE SPECIES PRESENT, SUGGEST RECOLLECTION  Final   Report Status 09/20/2015 FINAL  Final  Blood Culture (routine x 2)     Status: None   Collection Time: 09/19/15  6:28 PM  Result Value Ref Range Status   Specimen Description BLOOD LEFT ARM  Final   Special Requests IN PEDIATRIC BOTTLE 1CC  Final   Culture NO GROWTH 5 DAYS  Final   Report Status 09/24/2015 FINAL  Final  MRSA PCR Screening     Status: Abnormal   Collection Time: 09/20/15  1:42 AM  Result Value Ref Range Status   MRSA by PCR POSITIVE (A) NEGATIVE Final    Comment:        The GeneXpert MRSA Assay (FDA approved for NASAL specimens only), is one component of a comprehensive MRSA colonization surveillance program. It is not intended to diagnose MRSA infection nor to guide or monitor treatment for MRSA infections. RESULT CALLED TO, READ BACK BY AND VERIFIED WITH: M INGRAM@0415  09/20/15 MKELLY   Blood Culture (routine x 2)     Status: None   Collection Time: 09/20/15  3:08 AM  Result Value Ref Range Status   Specimen Description BLOOD LEFT ANTECUBITAL  Final   Special Requests BOTTLES DRAWN AEROBIC ONLY 4CC  Final   Culture NO GROWTH 5 DAYS  Final   Report Status 09/25/2015 FINAL  Final  MRSA PCR Screening     Status: Abnormal   Collection Time: 09/23/15  9:58 AM  Result Value Ref Range Status   MRSA by PCR POSITIVE (A) NEGATIVE Final    Comment:        The GeneXpert MRSA Assay (FDA approved for NASAL specimens only), is one component of a comprehensive MRSA colonization surveillance program. It is not intended to diagnose MRSA infection nor to  guide or monitor treatment for MRSA infections. RESULT CALLED TO, READ BACK BY AND VERIFIED WITH: D MUHORO,RN AT 1252 09/23/15 BY K BARR      Studies:   Recent x-ray studies have been reviewed in detail by the Attending Physician  Scheduled Meds:  Scheduled Meds: . aspirin EC  325 mg Oral Daily  . atorvastatin  20 mg Oral q1800  . Chlorhexidine Gluconate Cloth  6 each Topical Q0600  . digoxin  0.125 mg Intravenous Daily  . diltiazem  180 mg Oral BID  . divalproex  125 mg Oral BID  . fentaNYL  12.5 mcg Transdermal Q72H  . ferrous sulfate  325 mg Oral Daily  . folic acid  1 mg Oral Daily  . furosemide  40 mg Intravenous BID  . ipratropium  0.5 mg Nebulization TID  . levalbuterol  0.63 mg Nebulization TID  . levothyroxine  125 mcg Oral QAC breakfast  . methotrexate  10 mg Oral Q Sun  . metoprolol tartrate  125 mg Oral BID  . minocycline  100 mg Oral BID  . mupirocin ointment  1 application Nasal BID  . traZODone  100 mg Oral QHS    Time spent on care of this patient: 35 mins   Makayli Bracken T , MD   [facility] Office  [phone] Pager - Text Page per Loretha Stapler as per below:  On-Call/Text Page:      [email or web address]      password [email or web address]  If 7PM-7AM, please contact night-coverage [email or web address] Password [email or web address] 09/27/2015, 3:40 PM   LOS: 4 days

## 2015-09-27 NOTE — Evaluation (Signed)
Occupational Therapy Evaluation Patient Details Name: Sierra Singleton MRN: [id number] DOB: [birth year] Today's Date: 09/27/2015    History of Present Illness HPI: Sierra Singleton is a 81 y.o. female with past medical history of chronic respiratory failure on 2 L of oxygen, COPD, CHF, A. Fib, , hypertension, [name] presents to the emergency department after being found on the floor at the skilled nursing facility where she resides. Initial evaluation in the emergency department reveals acute on chronic respiratory failure with hypoxia, A. fib with RVR, Acute on chronic kidney disease, acute on chronic diastolic heart failure with mild COPD exacerbation.   Clinical Impression   Pt is assisted at the SNF in all ADL and requires +2 assistance for bed mobility, sit>stand and to take several side steps. Pt has baseline cognitive impairment.  Plan is for pt to return to SNF. Will defer further OT to discretion of SNF.   Follow Up Recommendations  SNF;Supervision/Assistance - 24 hour    Equipment Recommendations  None recommended by OT    Recommendations for Other Services       Precautions / Restrictions Precautions Precautions: Fall Restrictions Weight Bearing Restrictions: No      Mobility Bed Mobility Overal bed mobility: Needs Assistance;+2 for physical assistance Bed Mobility: Rolling;Supine to Sit;Sit to Supine Rolling: Mod assist   Supine to sit: +2 for physical assistance;Max assist Sit to supine: Mod assist;+2 for physical assistance   General bed mobility comments: Pt able to bridge and attempting to assist with sit >supine but difficulty following instructions.  Transfers Overall transfer level: Needs assistance Equipment used: 2 person hand held assist Transfers: Sit to/from Stand Sit to Stand: Mod assist;+2 physical assistance         General transfer comment: Pt stood with MOD of 2, but was able to do a semi stand with min/guard and scoot self on  EOB    Balance     Sitting balance-Leahy Scale: Fair       Standing balance-Leahy Scale: Poor                              ADL Overall ADL's : At baseline                                       General ADL Comments: Pt is assisted with bathing, dressing and sometimes with feeding to increase intake.     Vision     Perception     Praxis      Pertinent Vitals/Pain Pain Assessment: Faces Faces Pain Scale: Hurts even more Pain Location: back Pain Descriptors / Indicators: Grimacing;Discomfort Pain Intervention(s): Limited activity within patient's tolerance;Monitored during session;Repositioned     Hand Dominance Right   Extremity/Trunk Assessment Upper Extremity Assessment Upper Extremity Assessment: Generalized weakness   Lower Extremity Assessment Lower Extremity Assessment: Generalized weakness       Communication Communication Communication: No difficulties   Cognition Arousal/Alertness: Awake/alert Behavior During Therapy: WFL for tasks assessed/performed Overall Cognitive Status: History of cognitive impairments - at baseline                     General Comments       Exercises       Shoulder Instructions      Home Living Family/patient expects to be discharged to:: Skilled nursing facility  Additional Comments: Has been at SNF for rehab since fall with pubic rami fxs and pelvic hemorrhage in May      Prior Functioning/Environment Level of Independence: Needs assistance        Comments: Pt's son reports she needs assist at SNF to get into wheelchair; he is not sure if she gets OOB daily , or only when he is there    OT Diagnosis: Generalized weakness;Cognitive deficits;Acute pain   OT Problem List:     OT Treatment/Interventions:      OT Goals(Current goals can be found in the care plan section) Acute Rehab OT Goals Patient Stated Goal: to get some  sweet tea before she dies  OT Frequency:     Barriers to D/C:            Co-evaluation PT/OT/SLP Co-Evaluation/Treatment: Yes Reason for Co-Treatment: For patient/therapist safety PT goals addressed during session: Mobility/safety with mobility OT goals addressed during session: ADL's and self-care      End of Session Equipment Utilized During Treatment: Oxygen Nurse Communication:  (NT aware that pt urinated)  Activity Tolerance: Patient tolerated treatment well Patient left: in bed;with call bell/phone within reach;with bed alarm set   Time: [id number] OT Time Calculation (min): 37 min Charges:  OT General Charges $OT Visit: 1 Procedure OT Evaluation $Initial OT Evaluation Tier I: 1 Procedure G-Codes:    Sierra Singleton, Sierra Singleton Sierra 09/27/2015, 1:47 PM [phone]

## 2015-09-27 NOTE — Consult Note (Signed)
Consultation Note Date: 09/27/2015   Patient Name: Sierra Singleton  DOB: 09-10-1933  MRN: [id number]  Age / Sex: 81 y.o., female  PCP: Ignatius Specking, MD Referring Physician: Lonia Blood, MD  Reason for Consultation: Establishing goals of care and Psychosocial/spiritual support    Clinical Assessment/Narrative:  Sierra Singleton is a 81 y.o. female with past medical history of chronic respiratory failure on 2 L of oxygen, COPD, CHF, A. Fib, , hypertension, [name] presents to the emergency department after being found on the floor at the skilled nursing facility where she resides. Initial evaluation in the emergency department reveals acute on chronic respiratory failure with hypoxia, A. fib with RVR, Acute on chronic kidney disease, acute on chronic diastolic heart failure with mild COPD exacerbation.  Information is obtained from chart and emergency room staff. She was found on the floor at the facility EMS reports oxygen saturation level in the 80s in the field. Her oxygen was up to 4 L she was transported to the emergency department. EMS also reports that the facility staff reported her mentation was at baseline. No report of any recent fever cough nausea vomiting diarrhea. No report of chest pain palpitations syncope or near-syncope. Patient did complaint of "pain all over".  Workup in the emergency department includes basic metabolic panel significant for chloride of 90, CO2 35 BUN 21 creatinine 1.22, complete blood count with hemoglobin of 9.5 hematocrit 30.0, BNP 451.4. INR 1.73. EKG reveals A. fib with RVR. Magnesium CK within the limits of normal.  In the emergency department she is afebrile hemodynamically stable but tachycardic in the 120s to 130s. Oxygen saturation level greater than 90% on 4 L. She is provided with diltiazem loading dose and extended release capsule with little improvement  therefore diltiazem drip was started. She was also provided with DuoNeb's and morphine.  Of note patient had a admission 2 days ago related to similar issues.  Family reports continued physical, functional and cognitive decline over the past six months.  Faced with advanced directive decsions and anticipatory care needs.  This NP Lorinda Creed reviewed medical records, received report from team, assessed the patient and then meet at the patient's bedside along with her two sons, Youlanda Roys and his wife, brother  to discuss diagnosis, prognosis, GOC, EOL wishes disposition and options.  A detailed discussion was had today regarding advanced directives.  Concepts specific to code status, artifical feeding and hydration, continued IV antibiotics and rehospitalization was had.  The difference between a aggressive medical intervention path  and a palliative comfort care path for this patient at this time was had.  Values and goals of care important to patient and family were attempted to be elicited.  Patient herself verbalized her readiness whenever "God calls for her"  Concept of Hospice and Palliative Care were discussed  Natural trajectory and expectations at EOL were discussed.  Questions and concerns addressed.  Hard Choices booklet left for review. Family encouraged to call with questions or concerns.  PMT will continue to support holistically.    Contacts/Participants in Discussion:  Two sons Primary Decision Maker: Tory Emerald  HCPOA: no    SUMMARY OF RECOMMENDATIONS  -discharge back to SNF/[facility] tomorrow with a focus on comfort and dignity  -initiate hospice services, spoke with [facility] and they are in agreement  - avoid diagnostics and no further life prolonging medical interventions, no further investigation of lung lesion  -focus is comfort, quality and dignity    Code Status/Advance  Care Planning: DNR       Code Status Orders        Start     Ordered    09/23/15 0945  Do not attempt resuscitation (DNR)   Continuous    Question Answer Comment  In the event of cardiac or respiratory ARREST Do not call a "code blue"   In the event of cardiac or respiratory ARREST Do not perform Intubation, CPR, defibrillation or ACLS   In the event of cardiac or respiratory ARREST Use medication by any route, position, wound care, and other measures to relive pain and suffering. May use oxygen, suction and manual treatment of airway obstruction as needed for comfort.      09/23/15 0944    Advance Directive Documentation        Most Recent Value   Type of Advance Directive  Out of facility DNR (pink MOST or yellow form)   Pre-existing out of facility DNR order (yellow form or pink MOST form)  Yellow form placed in chart (order not valid for inpatient use)   "MOST" Form in Place?        Other Directives:MOST Form-completed today  Symptom Management:   Behavioral changes 2/2 to dementia; increase Depakote to 250 mg bid  Palliative Prophylaxis:   Bowel Regimen, Delirium Protocol, Frequent Pain Assessment, Oral Care, Palliative Wound Care, Turn Reposition and Safety  Additional Recommendations (Limitations, Scope, Preferences):  Avoid Hospitalization, Full Comfort Care, Minimize Medications, No Artificial Feeding, No Diagnostics and No IV Fluids  Psycho-social/Spiritual:  Support System: Strong  Additional Recommendations: Education on Hospice  Prognosis: < 6 months  Discharge Planning: Skilled Nursing Facility with Hospice   Chief Complaint/ Primary Diagnoses: Present on Admission:  . COPD (chronic obstructive pulmonary disease) (HCC) . Chronic diastolic CHF (congestive heart failure) (HCC) . CKD (chronic kidney disease) . Atrial fibrillation with RVR (HCC) . Acute on chronic respiratory failure (HCC) . Macrocytic anemia . Hypothyroidism . Lung mass . Acute renal failure superimposed on stage 3 chronic kidney disease (HCC) . Fall .  Delirium . Acute on chronic respiratory failure with hypoxia (HCC) . Acute on chronic diastolic CHF (congestive heart failure), NYHA class 1 (HCC) . Cardiomyopathy (HCC) . COPD exacerbation (HCC) . Recurrent falls  I have reviewed the medical record, interviewed the patient and family, and examined the patient. The following aspects are pertinent.  Past Medical History  Diagnosis Date  . Rash     [name] rash  . Anxiety   . Thyroid disease   . Hyperlipidemia   . Hypertension   . COPD (chronic obstructive pulmonary disease) (HCC)   . Poor historian   . Carotid artery disease (HCC)   . Atrial fibrillation (HCC) 02/11/2015  . Chronic respiratory failure with hypoxia (HCC) 02/11/2015  . 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 Collagen vascular disease (HCC)   . CHF (congestive heart failure) (HCC)    Social History   Social History  . Marital Status: Divorced    Spouse Name: N/A  . Number of Children: N/A  . Years of Education: N/A   Social History Main Topics  . Smoking status: Former Games developer  . Smokeless tobacco: None  . Alcohol Use: No  . Drug Use: No  . Sexual Activity: No   Other Topics Concern  . None   Social History Narrative   Family History  Problem Relation Age of Onset  . Stroke Father   . Cancer Mother   . COPD Sister   . Emphysema  Sister   . Other Brother     BRAIN TUMOR  . Colon cancer Brother    Scheduled Meds: . aspirin EC  325 mg Oral Daily  . atorvastatin  20 mg Oral q1800  . Chlorhexidine Gluconate Cloth  6 each Topical Q0600  . digoxin  0.125 mg Intravenous Daily  . diltiazem  180 mg Oral BID  . divalproex  125 mg Oral BID  . fentaNYL  12.5 mcg Transdermal Q72H  . ferrous sulfate  325 mg Oral Daily  . folic acid  1 mg Oral Daily  . furosemide  40 mg Intravenous BID  . ipratropium  0.5 mg Nebulization TID  . levalbuterol  0.63 mg Nebulization TID  . levothyroxine  125 mcg Oral QAC breakfast  . methotrexate  10 mg Oral Q Sun  .  metoprolol tartrate  125 mg Oral BID  . minocycline  100 mg Oral BID  . mupirocin ointment  1 application Nasal BID  . traZODone  100 mg Oral QHS   Continuous Infusions: . sodium chloride 10 mL/hr (09/24/15 1400)   PRN Meds:.acetaminophen **OR** acetaminophen, bisacodyl, HYDROcodone-acetaminophen, hydrOXYzine, LORazepam, ondansetron **OR** ondansetron (ZOFRAN) IV, senna-docusate Medications Prior to Admission:  Prior to Admission medications   Medication Sig Start Date End Date Taking? Authorizing Provider  acetaminophen (TYLENOL) 650 MG CR tablet Take 650 mg by mouth every 8 (eight) hours as needed for pain.   Yes Historical Provider, MD  atorvastatin (LIPITOR) 20 MG tablet Take 20 mg by mouth daily at 6 PM.    Yes Historical Provider, MD  calcitonin, salmon, (MIACALCIN/FORTICAL) 200 UNIT/ACT nasal spray Place 1 spray into alternate nostrils daily.   Yes Historical Provider, MD  cloNIDine (CATAPRES) 0.1 MG tablet Take 0.1 mg by mouth 2 (two) times daily as needed (Take only if Blood pressure greater than 200).   Yes Historical Provider, MD  cyanocobalamin 500 MCG tablet Place 500 mcg under the tongue daily.   Yes Historical Provider, MD  diltiazem (CARDIZEM CD) 180 MG 24 hr capsule Take 1 capsule (180 mg total) by mouth 2 (two) times daily. 09/22/15  Yes Starleen Arms, MD  divalproex (DEPAKOTE SPRINKLE) 125 MG capsule Take 125 mg by mouth 2 (two) times daily.   Yes Historical Provider, MD  feeding supplement, ENSURE ENLIVE, (ENSURE ENLIVE) LIQD Take 237 mLs by mouth 2 (two) times daily between meals. 09/22/15  Yes Starleen Arms, MD  fentaNYL (DURAGESIC - DOSED MCG/HR) 12 MCG/HR Place 1 patch (12.5 mcg total) onto the skin every 3 (three) days. 09/22/15  Yes Starleen Arms, MD  ferrous sulfate 325 (65 FE) MG tablet Take 325 mg by mouth daily.   Yes Historical Provider, MD  folic acid (FOLVITE) 1 MG tablet Take 1 mg by mouth daily.   Yes Historical Provider, MD  furosemide (LASIX)  40 MG tablet Take 40 mg by mouth daily.   Yes Historical Provider, MD  HYDROcodone-acetaminophen (NORCO/VICODIN) 5-325 MG tablet Take 1 tablet by mouth every 4 (four) hours as needed for moderate pain. 09/22/15  Yes Starleen Arms, MD  hydrOXYzine (ATARAX/VISTARIL) 25 MG tablet Take 25 mg by mouth at bedtime.    Yes Historical Provider, MD  hydrOXYzine (ATARAX/VISTARIL) 25 MG tablet Take 25 mg by mouth 3 (three) times daily as needed for itching.   Yes Historical Provider, MD  levalbuterol (XOPENEX) 0.63 MG/3ML nebulizer solution Take 3 mLs (0.63 mg total) by nebulization every 6 (six) hours as needed for wheezing or shortness of  breath. 02/17/15  Yes Erick BlinksJehanzeb Memon, MD  levothyroxine (SYNTHROID, LEVOTHROID) 125 MCG tablet Take 125 mcg by mouth daily before breakfast.   Yes Historical Provider, MD  loratadine (CLARITIN) 10 MG tablet Take 10 mg by mouth daily.   Yes Historical Provider, MD  LORazepam (ATIVAN) 1 MG tablet Take 1 tablet (1 mg total) by mouth every 12 (twelve) hours as needed for anxiety. 09/22/15  Yes Starleen Armsawood S Elgergawy, MD  methotrexate 2.5 MG tablet Take 10 mg by mouth once a week. On Sunday.   Yes Historical Provider, MD  metolazone (ZAROXOLYN) 2.5 MG tablet Take 2.5 mg by mouth every Monday.    Yes Historical Provider, MD  metoprolol tartrate (LOPRESSOR) 25 MG tablet Take 3 tablets (75 mg total) by mouth 2 (two) times daily. 09/22/15  Yes Starleen Armsawood S Elgergawy, MD  minocycline (MINOCIN,DYNACIN) 100 MG capsule Take 100 mg by mouth 2 (two) times daily.   Yes Historical Provider, MD  OXYGEN Inhale 2 L into the lungs continuous.   Yes Historical Provider, MD  potassium chloride SA (K-DUR,KLOR-CON) 20 MEQ tablet Take 20 mEq by mouth 3 (three) times daily. For 3 days   Yes Historical Provider, MD  traZODone (DESYREL) 100 MG tablet Take 100 mg by mouth at bedtime.   Yes Historical Provider, MD  Vilazodone HCl (VIIBRYD) 20 MG TABS Take 20 mg by mouth daily.   Yes Historical Provider, MD    Vitamin D, Ergocalciferol, (DRISDOL) 50000 UNITS CAPS capsule Take 50,000 Units by mouth every 7 (seven) days. Take on thursdays   Yes Historical Provider, MD  warfarin (COUMADIN) 2.5 MG tablet Take 2.5 mg by mouth every Tuesday, Thursday, Saturday, and Sunday at 6 PM.    Yes Historical Provider, MD  warfarin (COUMADIN) 5 MG tablet Take 5 mg by mouth every Monday, Wednesday, and Friday at 6 PM. Take 1 tablet every Monday, Wednesday, and Friday   Yes Historical Provider, MD   Allergies  Allergen Reactions  . Ambien [Zolpidem Tartrate]     unknown  . Sulfa Antibiotics Nausea And Vomiting  . Clindamycin/Lincomycin Swelling and Rash   CBC:    Component Value Date/Time   WBC 4.0 09/26/2015 0335   HGB 8.4* 09/26/2015 0335   HCT 27.9* 09/26/2015 0335   HCT 33.0* 02/09/2015 2004   PLT 284 09/26/2015 0335   MCV 98.2 09/26/2015 0335   NEUTROABS 5.1 09/23/2015 0635   LYMPHSABS 2.2 09/23/2015 0635   MONOABS 0.7 09/23/2015 0635   EOSABS 0.1 09/23/2015 0635   BASOSABS 0.0 09/23/2015 0635   Comprehensive Metabolic Panel:    Component Value Date/Time   NA 135 09/26/2015 0335   K 4.0 09/26/2015 0335   CL 92* 09/26/2015 0335   CO2 34* 09/26/2015 0335   BUN 25* 09/26/2015 0335   CREATININE 1.54* 09/26/2015 0335   GLUCOSE 86 09/26/2015 0335   CALCIUM 8.2* 09/26/2015 0335   AST 17 09/26/2015 0335   ALT 9* 09/26/2015 0335   ALKPHOS 79 09/26/2015 0335   BILITOT 0.7 09/26/2015 0335   PROT 4.6* 09/26/2015 0335   ALBUMIN 1.6* 09/26/2015 0335    Review of Systems  Constitutional: Positive for fatigue.  Neurological: Positive for weakness.    Physical Exam  Constitutional: She appears cachectic. She is cooperative.  Cardiovascular: Normal rate and regular rhythm.   GI: Soft. Normal appearance.  Neurological: She is alert. She displays atrophy.  Skin: Bruising and ecchymosis noted.  Psychiatric: Cognition and memory are impaired. She exhibits abnormal recent memory and abnormal  remote  memory.  -pleasantly confused    Vital Signs: BP 125/36 mmHg  Pulse 70  Temp(Src) 98.6 F (37 C) (Oral)  Resp 18  Ht 5\' 4"  (1.626 m)  Wt 72.8 kg (160 lb 7.9 oz)  BMI 27.54 kg/m2  SpO2 100% SpO2: Last BM Date: 09/25/15  O2 Device:SpO2: 100 % O2 Flow Rate: .O2 Flow Rate (L/min): 2 L/min Intake/output summary:  Intake/Output Summary (Last 24 hours) at 09/27/15 1506 Last data filed at 09/27/15 1332  Gross per 24 hour  Intake    590 ml  Output   1050 ml  Net   -460 ml   LBM:  BMP Latest Ref Rng 09/26/2015 09/25/2015 09/24/2015  Glucose 65 - 99 mg/dL 86 74 89  BUN 6 - 20 mg/dL 40(J) 81(X) 91(Y)  Creatinine 0.44 - 1.00 mg/dL 7.82(N) 5.62(Z) 3.08(M)  Sodium 135 - 145 mmol/L 135 140 139  Potassium 3.5 - 5.1 mmol/L 4.0 4.9 5.4(H)  Chloride 101 - 111 mmol/L 92(L) 98(L) 97(L)  CO2 22 - 32 mmol/L 34(H) 32 33(H)  Calcium 8.9 - 10.3 mg/dL 8.2(L) 8.4(L) 8.6(L)    Baseline Weight: Weight: 76.2 kg (167 lb 15.9 oz) Most recent weight: Weight: 72.8 kg (160 lb 7.9 oz)      Palliative Assessment/Data:  Flowsheet Rows        Most Recent Value   Intake Tab    Referral Department  Hospitalist   Unit at Time of Referral  Intermediate Care Unit   Palliative Care Primary Diagnosis  Pulmonary   Date Notified  09/26/15   Palliative Care Type  New Palliative care   Reason for referral  Clarify Goals of Care   Date of Admission  09/23/15   # of days IP prior to Palliative referral  3   Clinical Assessment    Psychosocial & Spiritual Assessment    Palliative Care Outcomes       Additional Data Reviewed: Recent Labs     09/25/15  0630  09/26/15  0335  WBC  4.7  4.0  HGB  8.3*  8.4*  PLT  213  284  NA  140  135  BUN  24*  25*  CREATININE  1.37*  1.54*    Time In: 1530 Time Out: 1700 Time Total: 90 min Greater than 50%  of this time was spent counseling and coordinating care related to the above assessment and plan.  Discussed with Dr Sharon Seller  Signed by: Lorinda Creed,  NP  Canary Brim, NP  09/27/2015, 3:06 PM  Please contact Palliative Medicine Team phone at [phone] for questions and concerns.

## 2015-09-28 ENCOUNTER — Telehealth: Payer: Self-pay

## 2015-09-28 DIAGNOSIS — E038 Other specified hypothyroidism: Secondary | ICD-10-CM

## 2015-09-28 DIAGNOSIS — R296 Repeated falls: Secondary | ICD-10-CM | POA: Diagnosis present

## 2015-09-28 DIAGNOSIS — J438 Other emphysema: Secondary | ICD-10-CM

## 2015-09-28 DIAGNOSIS — F0390 Unspecified dementia without behavioral disturbance: Secondary | ICD-10-CM | POA: Diagnosis present

## 2015-09-28 LAB — CBC
HCT: 30.6 % — ABNORMAL LOW (ref 36.0–46.0)
Hemoglobin: 9.6 g/dL — ABNORMAL LOW (ref 12.0–15.0)
MCH: 30.1 pg (ref 26.0–34.0)
MCHC: 31.4 g/dL (ref 30.0–36.0)
MCV: 95.9 fL (ref 78.0–100.0)
PLATELETS: 301 10*3/uL (ref 150–400)
RBC: 3.19 MIL/uL — ABNORMAL LOW (ref 3.87–5.11)
RDW: 16.1 % — AB (ref 11.5–15.5)
WBC: 5.6 10*3/uL (ref 4.0–10.5)

## 2015-09-28 LAB — BASIC METABOLIC PANEL
Anion gap: 12 (ref 5–15)
BUN: 33 mg/dL — AB (ref 6–20)
CHLORIDE: 98 mmol/L — AB (ref 101–111)
CO2: 30 mmol/L (ref 22–32)
CREATININE: 1.69 mg/dL — AB (ref 0.44–1.00)
Calcium: 8.7 mg/dL — ABNORMAL LOW (ref 8.9–10.3)
GFR calc Af Amer: 32 mL/min — ABNORMAL LOW (ref >60)
GFR calc non Af Amer: 27 mL/min — ABNORMAL LOW (ref >60)
Glucose, Bld: 77 mg/dL (ref 65–99)
Potassium: 3.7 mmol/L (ref 3.5–5.1)
SODIUM: 140 mmol/L (ref 135–145)

## 2015-09-28 LAB — DIGOXIN LEVEL: Digoxin Level: 0.7 ng/mL — ABNORMAL LOW (ref 0.8–2.0)

## 2015-09-28 MED ORDER — LEVALBUTEROL TARTRATE 45 MCG/ACT IN AERO: 2.0000 | INHALATION_SPRAY | Freq: Three times a day (TID) | RESPIRATORY_TRACT | Status: AC

## 2015-09-28 MED ORDER — FENTANYL 25 MCG/HR TD PT72
25.0000 ug | MEDICATED_PATCH | TRANSDERMAL | Status: DC
  Administered 2015-09-28: 25 ug via TRANSDERMAL
  Filled 2015-09-28: qty 1

## 2015-09-28 MED ORDER — ONDANSETRON HCL 4 MG PO TABS: 4.0000 mg | ORAL_TABLET | Freq: Four times a day (QID) | ORAL | Status: AC | PRN

## 2015-09-28 MED ORDER — BISACODYL 10 MG RE SUPP: 10.0000 mg | Freq: Every day | RECTAL | Status: DC | PRN

## 2015-09-28 MED ORDER — LEVALBUTEROL HCL 0.63 MG/3ML IN NEBU: 0.6300 mg | INHALATION_SOLUTION | Freq: Three times a day (TID) | RESPIRATORY_TRACT | Status: AC

## 2015-09-28 MED ORDER — SENNOSIDES-DOCUSATE SODIUM 8.6-50 MG PO TABS: 1.0000 | ORAL_TABLET | Freq: Every evening | ORAL | Status: AC | PRN

## 2015-09-28 MED ORDER — METOPROLOL TARTRATE 75 MG PO TABS: 150.0000 mg | ORAL_TABLET | Freq: Two times a day (BID) | ORAL | Status: AC

## 2015-09-28 MED ORDER — DIGOXIN 62.5 MCG PO TABS: 0.0625 mg | ORAL_TABLET | Freq: Every day | ORAL | Status: AC

## 2015-09-28 MED ORDER — FUROSEMIDE 40 MG PO TABS: 40.0000 mg | ORAL_TABLET | Freq: Two times a day (BID) | ORAL | Status: AC

## 2015-09-28 MED ORDER — ASPIRIN 325 MG PO TBEC: 325.0000 mg | DELAYED_RELEASE_TABLET | Freq: Every day | ORAL | Status: AC

## 2015-09-28 NOTE — Telephone Encounter (Signed)
Opened in error

## 2015-09-28 NOTE — Progress Notes (Signed)
Report called to Nurse Amra at [facility]. Iv discontinued. Bandages changed to right abdomen.

## 2015-09-28 NOTE — Discharge Summary (Signed)
Physician Discharge Summary  Sierra Singleton GNF:[id number] DOB: 11/30/1932 DOA: 09/23/2015  PCP: Ignatius SpeckingVYAS,DHRUV B., MD  Admit date: 09/23/2015 Discharge date: 09/28/2015  Time spent: 40 minutes  Recommendations for Outpatient Follow-up:  Acute on chronic hypoxic respiratory failure -Multifactorial predominantly acute on chronic diastolic heart failure with A. fib and RVR in the setting of mild COPD exacerbation -Now on 2 L O2 via [place] with SPO2~97%. This is baseline O2 use -Ready for discharge to [facility]  Chronic Atrial fibrillation with actue RVR -CHA2DS2 - VASc 5  - See recurrent fall  -Patient's BP borderline for an 81 year old and with recurrent falls continue Cardizem 180 mg BID. -Continue low-dose Digoxin 0.0625 mg daily  Acute on chronic grade 2 diastolic CHF/Cardiomyopathy -CXR c/w pulmonary congestion - echo in March of this year EF 60% with grade 2 diastolic dysfunction - dry weight was 72.5 kg ,presently 168 pounds (76.6 kg). -Strict in and out; since admission 11/3= - 2.2 L -Daily weight (dry weight 72.5 kg); 11/3 weight= 72.8 kg, patient's Cr trending up believe we might have reach patient's new dry weight. -Continue Lasix to 40 mg BID -Patient is now comfort care -Weigh patient daily and record in journal. Sliding scale Lasix: Weigh yourself when you get home, then Daily in the Morning. Your dry weight will be what your scale says today when you return to [facility]    If you gain more than 3 pounds from dry weight: Increase the Lasix dosing to 60 mg in the morning and 60 mg in the afternoon until weight returns to baseline dry weight.  If weight gain is greater than 5 pounds in 2 days: Increased to Lasix 80 mg twice a day and contact your PCP/Cardiologist office for further assistance if weight does not go down the next day.  If the weight goes down more than 3 pounds from dry weight: Hold Lasix until it returns to baseline dry weight    COPD with mild acute  bronchospastic exacerbation -Much improved today with no appreciable wheeze on exam - continue usual therapy and follow  Acute renal failure superimposed on stage 3 chronic kidney disease  -New baseline Cr ~1.69  Recurrent Fall -Etiology unclear most likely multifactorial to include deconditioning, A. fib with RVR?, Orthostatic hypotension? -CXR showed negative fractures -With recurrent falls have DCed warfarin Spoke with Sierra Singleton (son) HCPOA on 11/1 and explained to him the risks and benefits of continuing warfarin vs risk and benefits of placing mother on full dose aspirin. Will DC warfarin and start patient on full dose aspirin daily secondary to her frequent falls. -Discharge on aspirin 325 mg daily   Macrocytic anemia -No sign of active bleeding - hemoglobin appears to have stabilized at approximately 8.0 - recheck in a.m.   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 Focuses to be on comfort and dignity per palliative care note   Dementia? versus toxic metabolic encephalopathy  -follow mental status with treatment of acute/active medical issues  - B-12 and folic acid levels of WNL   Goals of care -Palliative care consult placed; per palliative care meeting 11/2 patient is to be discharged back to [facility]. - avoid diagnostics and no further life prolonging medical interventions, no further investigation of lung lesion -focus is comfort, quality and dignity    Discharge Diagnoses:  Principal Problem:   Acute on chronic  respiratory failure (HCC) Active Problems:   COPD (chronic obstructive pulmonary disease) (HCC)   Macrocytic anemia   Hypothyroidism   Chronic diastolic CHF (congestive heart failure) (HCC)   CKD (chronic kidney disease)   [name]   Lung mass   Atrial fibrillation with RVR  (HCC)   Acute renal failure superimposed on stage 3 chronic kidney disease (HCC)   Fall   Delirium   Acute on chronic respiratory failure with hypoxia (HCC)   Acute on chronic diastolic CHF (congestive heart failure), NYHA class 1 (HCC)   Cardiomyopathy (HCC)   COPD exacerbation (HCC)   Recurrent falls   Palliative care encounter   DNR (do not resuscitate)   Frequent falls   Dementia   Discharge Condition: Stable  Diet recommendation: Heart healthy thin liquid  Filed Weights   09/25/15 0500 09/26/15 0500 09/27/15 0300  Weight: 76.2 kg (167 lb 15.9 oz) 76.6 kg (168 lb 14 oz) 72.8 kg (160 lb 7.9 oz)    History of present illness:  81 y.o. WF PMHx Chronic Respiratory failure on 2 L of oxygen, COPD, CHF, A. Fib, HTN ,and [name] (familial benign chronic pemphigus) who presented to the ED after being found on the floor at the skilled nursing facility where she resides. Initial evaluation in the emergency department revealed acute on chronic respiratory failure with hypoxia, A. fib with RVR, Acute on chronic kidney disease, acute on chronic diastolic heart failure, and a mild COPD exacerbation.  Workup in the emergency department includes basic metabolic panel significant for chloride of 90, CO2 35 BUN 21 creatinine 1.22, complete blood count with hemoglobin of 9.5 hematocrit 30.0, BNP 451.4. INR 1.73. EKG revealed A. fib with RVR. She was afebrile and hemodynamically stable but tachycardic in the 120s to 130s. Oxygen saturation level greater than 90% on 4 L. She was provided with diltiazem loading dose and extended release capsule with little improvement therefore diltiazem drip was started.  NOTE; patient just discharged on 10/28 for similar problems. During his hospitalization patient was treated for acute on chronic respiratory failure with hypoxia, A. fib with RVR, diastolic CHF/cardiomyopathy, and contusions/abrasions for multiple falls.     Procedure/Significant  Events: 3/18 echocardiogram;Left ventricle: mild concentric hypertrophy. LVEF= 60% to 65%.  -(grade 2 diastolic dysfunction). - Mitral valve:mild to moderate regurgitation.- Left atrium: moderately to severely dilated. - Right ventricle: mildly dilated. - Right atrium: mildly to moderately dilated. 10/29 CT head without contrast; negative for traumatic injury, negative CVA, soft tissue injuries to head right orbital area 10/29 CXR; negative fractures., Soft tissue injuries   Culture None  Antibiotics: Minocycline - chronic use   DVT prophylaxis: Aspirin 325 mg + SCD    Discharge Exam: Filed Vitals:   09/27/15 2005 09/27/15 2028 09/28/15 0401 09/28/15 0831  BP:  134/33 109/52   Pulse:  82 69   Temp:  98.4 F (36.9 C) 98.4 F (36.9 C)   TempSrc:  Oral Oral   Resp:  19 20   Height:      Weight:      SpO2: 100% 96% 99% 79%    General: A/O 2 (does not know when, why) , NAD, No acute respiratory distress Eyes: Negative headache, eye pain, double vision, negative scleral hemorrhage ENT: Negative Runny nose, negative ear pain, negative gingival bleeding, Neck: Negative scars, masses, torticollis, lymphadenopathy, multiple hematomas on the right neck and shoulder area, JVD Lungs: Clear to auscultation bilaterally without wheezes or crackles Cardiovascular: Irregular irregular rhythm and rate, without  murmur gallop or rub normal S1 and S2 Abdomen:negative abdominal pain, nondistended, positive soft, bowel sounds, no rebound, no ascites, no appreciable mass Extremities: No significant cyanosis, clubbing, or edema bilateral lower extremities, multiple lacerations and bruising on bilateral legs and arms Psychiatric: Negative depression, negative anxiety, negative fatigue, negative mania  Neurologic: Cranial nerves II through XII intact, tongue/uvula midline, all extremities muscle strength 5/5, sensation intact throughout, negative dysarthria, negative expressive aphasia,  negative receptive aphasia. Skin; she has multiple contusions, hematomas, lacerations on right face shoulders, bilateral arms bilateral lower extremities, right lateral thoracic area.   Discharge Instructions     Medication List    STOP taking these medications        cloNIDine 0.1 MG tablet  Commonly known as:  CATAPRES     metolazone 2.5 MG tablet  Commonly known as:  ZAROXOLYN     potassium chloride SA 20 MEQ tablet  Commonly known as:  K-DUR,KLOR-CON     VIIBRYD 20 MG Tabs  Generic drug:  Vilazodone HCl     warfarin 2.5 MG tablet  Commonly known as:  COUMADIN     warfarin 5 MG tablet  Commonly known as:  COUMADIN      TAKE these medications        acetaminophen 650 MG CR tablet  Commonly known as:  TYLENOL  Take 650 mg by mouth every 8 (eight) hours as needed for pain.     aspirin 325 MG EC tablet  Take 1 tablet (325 mg total) by mouth daily.     atorvastatin 20 MG tablet  Commonly known as:  LIPITOR  Take 20 mg by mouth daily at 6 PM.     calcitonin (salmon) 200 UNIT/ACT nasal spray  Commonly known as:  MIACALCIN/FORTICAL  Place 1 spray into alternate nostrils daily.     cyanocobalamin 500 MCG tablet  Place 500 mcg under the tongue daily.     Digoxin 62.5 MCG Tabs  Take 0.0625 mg by mouth daily.     diltiazem 180 MG 24 hr capsule  Commonly known as:  CARDIZEM CD  Take 1 capsule (180 mg total) by mouth 2 (two) times daily.     divalproex 125 MG capsule  Commonly known as:  DEPAKOTE SPRINKLE  Take 125 mg by mouth 2 (two) times daily.     feeding supplement (ENSURE ENLIVE) Liqd  Take 237 mLs by mouth 2 (two) times daily between meals.     fentaNYL 12 MCG/HR  Commonly known as:  DURAGESIC - dosed mcg/hr  Place 1 patch (12.5 mcg total) onto the skin every 3 (three) days.     ferrous sulfate 325 (65 FE) MG tablet  Take 325 mg by mouth daily.     folic acid 1 MG tablet  Commonly known as:  FOLVITE  Take 1 mg by mouth daily.     furosemide 40  MG tablet  Commonly known as:  LASIX  Take 1 tablet (40 mg total) by mouth 2 (two) times daily.     HYDROcodone-acetaminophen 5-325 MG tablet  Commonly known as:  NORCO/VICODIN  Take 1 tablet by mouth every 4 (four) hours as needed for moderate pain.     hydrOXYzine 25 MG tablet  Commonly known as:  ATARAX/VISTARIL  Take 25 mg by mouth 3 (three) times daily as needed for itching.     levalbuterol 0.63 MG/3ML nebulizer solution  Commonly known as:  XOPENEX  Take 3 mLs (0.63 mg total) by nebulization every 6 (six)  hours as needed for wheezing or shortness of breath.     levalbuterol 0.63 MG/3ML nebulizer solution  Commonly known as:  XOPENEX  Take 3 mLs (0.63 mg total) by nebulization 3 (three) times daily.     levalbuterol 45 MCG/ACT inhaler  Commonly known as:  XOPENEX HFA  Inhale 2 puffs into the lungs 3 (three) times daily.     levothyroxine 125 MCG tablet  Commonly known as:  SYNTHROID, LEVOTHROID  Take 125 mcg by mouth daily before breakfast.     loratadine 10 MG tablet  Commonly known as:  CLARITIN  Take 10 mg by mouth daily.     LORazepam 1 MG tablet  Commonly known as:  ATIVAN  Take 1 tablet (1 mg total) by mouth every 12 (twelve) hours as needed for anxiety.     methotrexate 2.5 MG tablet  Take 10 mg by mouth once a week. On Sunday.     Metoprolol Tartrate 75 MG Tabs  Take 150 mg by mouth 2 (two) times daily.     minocycline 100 MG capsule  Commonly known as:  MINOCIN,DYNACIN  Take 100 mg by mouth 2 (two) times daily.     ondansetron 4 MG tablet  Commonly known as:  ZOFRAN  Take 1 tablet (4 mg total) by mouth every 6 (six) hours as needed for nausea.     OXYGEN  Inhale 2 L into the lungs continuous.     senna-docusate 8.6-50 MG tablet  Commonly known as:  Senokot-S  Take 1 tablet by mouth at bedtime as needed for mild constipation.     traZODone 100 MG tablet  Commonly known as:  DESYREL  Take 100 mg by mouth at bedtime.     Vitamin D  (Ergocalciferol) 50000 UNITS Caps capsule  Commonly known as:  DRISDOL  Take 50,000 Units by mouth every 7 (seven) days. Take on thursdays       Allergies  Allergen Reactions  . Ambien [Zolpidem Tartrate]     unknown  . Sulfa Antibiotics Nausea And Vomiting  . Clindamycin/Lincomycin Swelling and Rash   Follow-up Information    Follow up with VYAS,DHRUV B., MD. Schedule an appointment as soon as possible for a visit in 1 week.   Specialty:  Internal Medicine   Why:  Appointment in 1-2 weeks with Dr. Doreen Beam . Goal of care is to maintain patient's comfort and dignity   Contact information:   [address] [place] [address] [phone]        The results of significant diagnostics from this hospitalization (including imaging, microbiology, ancillary and laboratory) are listed below for reference.    Significant Diagnostic Studies: Dg Chest 2 View  09/23/2015  CLINICAL DATA:  Status post fall, with right arm pain and weakness. Concern for chest injury. Initial encounter. EXAM: CHEST  2 VIEW COMPARISON:  Chest radiograph performed 09/19/2015 FINDINGS: The lungs are well-aerated. Vascular congestion is noted. Mildly increased interstitial markings raise concern for mild interstitial edema. Mild bibasilar opacities likely reflect atelectasis. Small bilateral pleural effusions are seen. No pneumothorax is identified. The heart is mildly enlarged. No acute osseous abnormalities are seen. There is chronic deformity of the right humeral head. IMPRESSION: 1. Vascular congestion and mild cardiomegaly. Mildly increased interstitial markings raise concern for mild interstitial edema. Mild bibasilar opacities likely reflect atelectasis. Small bilateral pleural effusions seen. 2. No displaced rib fracture seen. Electronically Signed   By: Roanna Raider M.D.   On: 09/23/2015 05:52  Dg Pelvis 1-2 Views  09/19/2015  CLINICAL DATA:  Fall with injury.  Initial encounter. EXAM: PELVIS - 1-2 VIEW  COMPARISON:  CT of the pelvis on 04/08/2015 and 03/23/2015 FINDINGS: Displaced right-sided superior and inferior pubic rami fractures show partial healing and residual deformity. These were more acute in appearance by CT in April and May. No acute fracture or diastasis identified. Hip joints appear intact with mild osteoarthritis present bilaterally. IMPRESSION: Healing displaced fractures of the right superior and inferior pubic rami with residual deformity. No acute injuries identified. Electronically Signed   By: Irish Lack M.D.   On: 09/19/2015 16:44   Dg Forearm Right  09/23/2015  CLINICAL DATA:  Larey Seat today, forehead bruising, weakness RIGHT arm. EXAM: RIGHT FOREARM - 2 VIEW COMPARISON:  None. FINDINGS: There is no evidence of fracture or other focal bone lesions. Soft tissues are unremarkable. IMPRESSION: Negative. Electronically Signed   By: Awilda Metro M.D.   On: 09/23/2015 05:58   Ct Head Wo Contrast  09/23/2015  CLINICAL DATA:  Status post fall, with large right parietal scalp hematoma. Initial encounter. EXAM: CT HEAD WITHOUT CONTRAST CT MAXILLOFACIAL WITHOUT CONTRAST TECHNIQUE: Multidetector CT imaging of the head and maxillofacial structures were performed using the standard protocol without intravenous contrast. Multiplanar CT image reconstructions of the maxillofacial structures were also generated. COMPARISON:  CT of the head performed 09/19/2015 FINDINGS: CT HEAD FINDINGS There is no evidence of acute infarction, mass lesion, or intra- or extra-axial hemorrhage on CT. Prominence of the ventricles and sulci reflects mild cortical volume loss. Mild cerebellar atrophy is noted. Scattered periventricular and subcortical white matter change likely reflects small vessel ischemic microangiopathy. The brainstem and fourth ventricle are within normal limits. The basal ganglia are unremarkable in appearance. The cerebral hemispheres demonstrate grossly normal gray-white differentiation. No  mass effect or midline shift is seen. There is no evidence of fracture; visualized osseous structures are unremarkable in appearance. The orbits are within normal limits. The paranasal sinuses and mastoid air cells are well-aerated. Soft tissue swelling is noted overlying the right parietal calvarium. CT MAXILLOFACIAL FINDINGS There is no evidence of fracture or dislocation. The maxilla and mandible appear intact. The nasal bone is unremarkable in appearance. The visualized dentition demonstrates no acute abnormality. The orbits are intact bilaterally. The visualized paranasal sinuses and mastoid air cells are well-aerated. Soft tissue swelling is noted lateral to the right orbit. The parapharyngeal fat planes are preserved. The nasopharynx, oropharynx and hypopharynx are unremarkable in appearance. The visualized portions of the valleculae and piriform sinuses are grossly unremarkable. The parotid and submandibular glands are within normal limits. No cervical lymphadenopathy is seen. IMPRESSION: 1. No evidence of traumatic intracranial injury or fracture. 2. No evidence of fracture or dislocation with regard to the maxillofacial structures. 3. Soft tissue swelling overlying the right parietal calvarium, extending lateral to the right orbit. 4. Mild cortical volume loss and scattered small vessel ischemic microangiopathy. Electronically Signed   By: Roanna Raider M.D.   On: 09/23/2015 05:42   Ct Head Wo Contrast  09/19/2015  CLINICAL DATA:  81 year old with a fall last week. Knot on the back of the head. Altered mental status. EXAM: CT HEAD WITHOUT CONTRAST TECHNIQUE: Contiguous axial images were obtained from the base of the skull through the vertex without contrast. COMPARISON:  03/22/2015 FINDINGS: Stable cerebral and cerebellar atrophy. Stable low-density in the periventricular white matter. No evidence for acute hemorrhage, mass lesion, midline shift, hydrocephalus or large infarct. Mild mucosal thickening  in  the ethmoid air cells. Otherwise, the paranasal sinuses are clear. No calvarial fracture. Evidence for a small scalp contusion along the right posterior scalp. IMPRESSION: No acute intracranial abnormality. Stable atrophy.  Evidence for chronic small vessel ischemic changes. Electronically Signed   By: Richarda Overlie M.D.   On: 09/19/2015 16:43   Dg Chest Port 1 View  09/19/2015  CLINICAL DATA:  Shortness of breath, confusion.  Lung nodule. EXAM: PORTABLE CHEST - 1 VIEW COMPARISON:  Earlier film of the same day, and previous FINDINGS: Increase in bilateral interstitial edema or infiltrates. Right lower lobe nodule as described previously. Heart size upper limits normal for technique. Atheromatous aorta. No pneumothorax. No definite effusion. Visualized skeletal structures are unremarkable. IMPRESSION: 1. Increase in bilateral interstitial edema or infiltrates since earlier film. Electronically Signed   By: Corlis Leak M.D.   On: 09/19/2015 21:11   Dg Chest Port 1 View  09/19/2015  CLINICAL DATA:  Altered mental status.  Unwitnessed fall 1 week ago. EXAM: PORTABLE CHEST 1 VIEW COMPARISON:  02/18/2015 FINDINGS: COPD with chronic hyperinflation and apical emphysematous change. Indistinct opacity at the right base is noted, presumably persistence of the nodular lesion described 06/26/2015. Suspect small left pleural effusion. Borderline cardiomegaly for technique. Negative aortic and hilar contours. Remote proximal right humerus fracture. No acute osseous finding. IMPRESSION: 1. Probable trace left pleural effusion.  No pneumonia or edema. 2. Density at the right base correlating with suspicious nodule on chest CT 06/26/2015. Reference CT report. 3. COPD. Electronically Signed   By: Marnee Spring M.D.   On: 09/19/2015 16:45   Ct Maxillofacial Wo Cm  09/23/2015  CLINICAL DATA:  Status post fall, with large right parietal scalp hematoma. Initial encounter. EXAM: CT HEAD WITHOUT CONTRAST CT MAXILLOFACIAL  WITHOUT CONTRAST TECHNIQUE: Multidetector CT imaging of the head and maxillofacial structures were performed using the standard protocol without intravenous contrast. Multiplanar CT image reconstructions of the maxillofacial structures were also generated. COMPARISON:  CT of the head performed 09/19/2015 FINDINGS: CT HEAD FINDINGS There is no evidence of acute infarction, mass lesion, or intra- or extra-axial hemorrhage on CT. Prominence of the ventricles and sulci reflects mild cortical volume loss. Mild cerebellar atrophy is noted. Scattered periventricular and subcortical white matter change likely reflects small vessel ischemic microangiopathy. The brainstem and fourth ventricle are within normal limits. The basal ganglia are unremarkable in appearance. The cerebral hemispheres demonstrate grossly normal gray-white differentiation. No mass effect or midline shift is seen. There is no evidence of fracture; visualized osseous structures are unremarkable in appearance. The orbits are within normal limits. The paranasal sinuses and mastoid air cells are well-aerated. Soft tissue swelling is noted overlying the right parietal calvarium. CT MAXILLOFACIAL FINDINGS There is no evidence of fracture or dislocation. The maxilla and mandible appear intact. The nasal bone is unremarkable in appearance. The visualized dentition demonstrates no acute abnormality. The orbits are intact bilaterally. The visualized paranasal sinuses and mastoid air cells are well-aerated. Soft tissue swelling is noted lateral to the right orbit. The parapharyngeal fat planes are preserved. The nasopharynx, oropharynx and hypopharynx are unremarkable in appearance. The visualized portions of the valleculae and piriform sinuses are grossly unremarkable. The parotid and submandibular glands are within normal limits. No cervical lymphadenopathy is seen. IMPRESSION: 1. No evidence of traumatic intracranial injury or fracture. 2. No evidence of  fracture or dislocation with regard to the maxillofacial structures. 3. Soft tissue swelling overlying the right parietal calvarium, extending lateral to the right orbit. 4. Mild  cortical volume loss and scattered small vessel ischemic microangiopathy. Electronically Signed   By: Roanna Raider M.D.   On: 09/23/2015 05:42    Microbiology: Recent Results (from the past 240 hour(s))  Urine culture     Status: None   Collection Time: 09/19/15  5:30 PM  Result Value Ref Range Status   Specimen Description URINE, CATHETERIZED  Final   Special Requests NONE  Final   Culture MULTIPLE SPECIES PRESENT, SUGGEST RECOLLECTION  Final   Report Status 09/20/2015 FINAL  Final  Blood Culture (routine x 2)     Status: None   Collection Time: 09/19/15  6:28 PM  Result Value Ref Range Status   Specimen Description BLOOD LEFT ARM  Final   Special Requests IN PEDIATRIC BOTTLE 1CC  Final   Culture NO GROWTH 5 DAYS  Final   Report Status 09/24/2015 FINAL  Final  MRSA PCR Screening     Status: Abnormal   Collection Time: 09/20/15  1:42 AM  Result Value Ref Range Status   MRSA by PCR POSITIVE (A) NEGATIVE Final    Comment:        The GeneXpert MRSA Assay (FDA approved for NASAL specimens only), is one component of a comprehensive MRSA colonization surveillance program. It is not intended to diagnose MRSA infection nor to guide or monitor treatment for MRSA infections. RESULT CALLED TO, READ BACK BY AND VERIFIED WITH: M INGRAM@0415  09/20/15 MKELLY   Blood Culture (routine x 2)     Status: None   Collection Time: 09/20/15  3:08 AM  Result Value Ref Range Status   Specimen Description BLOOD LEFT ANTECUBITAL  Final   Special Requests BOTTLES DRAWN AEROBIC ONLY 4CC  Final   Culture NO GROWTH 5 DAYS  Final   Report Status 09/25/2015 FINAL  Final  MRSA PCR Screening     Status: Abnormal   Collection Time: 09/23/15  9:58 AM  Result Value Ref Range Status   MRSA by PCR POSITIVE (A) NEGATIVE Final     Comment:        The GeneXpert MRSA Assay (FDA approved for NASAL specimens only), is one component of a comprehensive MRSA colonization surveillance program. It is not intended to diagnose MRSA infection nor to guide or monitor treatment for MRSA infections. RESULT CALLED TO, READ BACK BY AND VERIFIED WITH: D MUHORO,RN AT 1252 09/23/15 BY K BARR      Labs: Basic Metabolic Panel:  Recent Labs Lab 09/22/15 0530 09/23/15 0635 09/24/15 0324 09/25/15 0630 09/26/15 0335 09/28/15 0315  NA 138 137 139 140 135 140  K 3.2* 4.4 5.4* 4.9 4.0 3.7  CL 89* 90* 97* 98* 92* 98*  CO2 36* 35* 33* 32 34* 30  GLUCOSE 82 101* 89 74 86 77  BUN 17 21* 26* 24* 25* 33*  CREATININE 1.15* 1.22* 1.64* 1.37* 1.54* 1.69*  CALCIUM 8.4* 8.7* 8.6* 8.4* 8.2* 8.7*  MG 1.5* 2.3  --   --   --   --    Liver Function Tests:  Recent Labs Lab 09/25/15 0630 09/26/15 0335  AST 15 17  ALT 8* 9*  ALKPHOS 72 79  BILITOT 0.9 0.7  PROT 4.6* 4.6*  ALBUMIN 1.5* 1.6*   No results for input(s): LIPASE, AMYLASE in the last 168 hours.  Recent Labs Lab 09/26/15 0335  AMMONIA 25   CBC:  Recent Labs Lab 09/23/15 0635 09/24/15 0324 09/25/15 0630 09/26/15 0335 09/28/15 0315  WBC 8.1 5.1 4.7 4.0 5.6  NEUTROABS 5.1  --   --   --   --  HGB 9.5* 8.0* 8.3* 8.4* 9.6*  HCT 30.0* 26.7* 27.6* 27.9* 30.6*  MCV 97.4 98.9 99.3 98.2 95.9  PLT 234 212 213 284 301   Cardiac Enzymes:  Recent Labs Lab 09/23/15 0635 09/23/15 1646 09/24/15 0324  CKTOTAL 42  --   --   TROPONINI  --  <0.03 <0.03   BNP: BNP (last 3 results)  Recent Labs  12/29/14 1258 02/09/15 1030 09/23/15 0635  BNP 593.0* 676.0* 451.4*    ProBNP (last 3 results) No results for input(s): PROBNP in the last 8760 hours.  CBG: No results for input(s): GLUCAP in the last 168 hours.     Signed:  Carolyne Littles, MD [facility] [phone] pager

## 2015-09-28 NOTE — Progress Notes (Signed)
Ok per MD for d/c today back to [facility] for continued SNF care.  Nursing notified to call report and DC summary sent to facility. EMS notified for 1:30 PM pick up. Patient is aware and agreeable as is his daughter-in-law Smita. [facility]- to be completed by SNF.  Discussed with He[facility]- Admissions at [facility].  No further CSW needs identified.  CSW signing off.  Lorri Frederickonna T. Jaci LazierCrowder, KentuckyLCSW [phone]

## 2015-09-28 NOTE — Progress Notes (Signed)
RT entered room and O2 off pt, sat 79%. Placed O2 back on pt at 3l [place], sat 93%. RN notiifed

## 2015-09-28 NOTE — Progress Notes (Signed)
Daily Progress Note   Patient Name: Sierra KalesShelby J Singleton       Date: 09/28/2015 DOB: [birth year]  Age: 81 y.o. MRN#: [id number] Attending Physician: Drema Dallasurtis J Woods, MD Primary Care Physician: Ignatius SpeckingVYAS,DHRUV B., MD Admit Date: 09/23/2015  Reason for Consultation/Follow-up: Establishing goals of care, Non pain symptom management, Pain control and Psychosocial/spiritual support  Subjective:  -patient is pleasantly confused, per nursing had a more restful night with change in Depakote dose  -plan is to return to SNF with Hospice services today   Length of Stay: 5 days  Current Medications: Scheduled Meds:  . aspirin EC  325 mg Oral Daily  . digoxin  0.0625 mg Oral Daily  . diltiazem  180 mg Oral BID  . divalproex  250 mg Oral BID  . fentaNYL  25 mcg Transdermal Q72H  . ferrous sulfate  325 mg Oral Daily  . folic acid  1 mg Oral Daily  . furosemide  40 mg Oral BID  . ipratropium  0.5 mg Nebulization TID  . levalbuterol  0.63 mg Nebulization TID  . levothyroxine  125 mcg Oral QAC breakfast  . methotrexate  10 mg Oral Q Sun  . metoprolol tartrate  125 mg Oral BID  . minocycline  100 mg Oral BID  . traZODone  100 mg Oral QHS    Continuous Infusions:    PRN Meds: acetaminophen **OR** acetaminophen, bisacodyl, bisacodyl, HYDROcodone-acetaminophen, hydrOXYzine, LORazepam, ondansetron **OR** ondansetron (ZOFRAN) IV, senna-docusate   Physical Exam  Constitutional: Vital signs are normal. She appears well-developed. She appears lethargic.  HENT:  Head: Normocephalic.  Noted facial bruising  Cardiovascular: Normal rate and regular rhythm.   Pulmonary/Chest: She has decreased breath sounds in the right lower field and the left lower field.  Abdominal: Soft. Normal appearance and bowel  sounds are normal.  Neurological: She appears lethargic.  Skin: Skin is warm and dry. Bruising noted.  Psychiatric: Her mood appears anxious.  Pleasantly confused                Vital Signs: BP 109/52 mmHg  Pulse 69  Temp(Src) 98.4 F (36.9 C) (Oral)  Resp 20  Ht 5\' 4"  (1.626 m)  Wt 72.8 kg (160 lb 7.9 oz)  BMI 27.54 kg/m2  SpO2 79% SpO2: SpO2: (!) 79 % (O2 placed) O2 Device: O2  Device: Not Delivered O2 Flow Rate: O2 Flow Rate (L/min): 2 L/min  Intake/output summary:  Intake/Output Summary (Last 24 hours) at 09/28/15 0858 Last data filed at 09/28/15 1610  Gross per 24 hour  Intake    350 ml  Output    550 ml  Net   -200 ml   LBM:   Baseline Weight: Weight: 76.2 kg (167 lb 15.9 oz) Most recent weight: Weight: 72.8 kg (160 lb 7.9 oz)       Palliative Assessment/Data: Flowsheet Rows        Most Recent Value   Intake Tab    Referral Department  Hospitalist   Unit at Time of Referral  Intermediate Care Unit   Palliative Care Primary Diagnosis  Pulmonary   Date Notified  09/26/15   Palliative Care Type  New Palliative care   Reason for referral  Clarify Goals of Care   Date of Admission  09/23/15   # of days IP prior to Palliative referral  3   Clinical Assessment    Psychosocial & Spiritual Assessment    Palliative Care Outcomes       Additional Data Reviewed: Recent Labs     09/26/15  0335  09/28/15  0315  WBC  4.0  5.6  HGB  8.4*  9.6*  PLT  284  301  NA  135  140  BUN  25*  33*  CREATININE  1.54*  1.69*     Problem List:  Patient Active Problem List   Diagnosis Date Noted  . Palliative care encounter 09/27/2015  . DNR (do not resuscitate) 09/27/2015  . Acute on chronic respiratory failure with hypoxia (HCC)   . Acute on chronic diastolic CHF (congestive heart failure), NYHA class 1 (HCC)   . Cardiomyopathy (HCC)   . COPD exacerbation (HCC)   . Recurrent falls   . Acute renal failure superimposed on stage 3 chronic kidney disease (HCC)  09/23/2015  . Fall 09/23/2015  . Delirium 09/23/2015  . Pressure ulcer 09/21/2015  . Acute on chronic respiratory failure (HCC) 09/20/2015  . Atrial fibrillation with RVR (HCC) 09/19/2015  . Lung mass 04/18/2015  . Symptomatic anemia 04/05/2015  . Pubic ramus fracture (HCC) 03/23/2015  . Fracture of multiple pubic rami (HCC) 03/23/2015  . Chronic diastolic CHF (congestive heart failure) (HCC) 03/23/2015  . Protein-calorie malnutrition (HCC) 03/23/2015  . CKD (chronic kidney disease) 03/23/2015  . 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 Atrial fibrillation (HCC) 02/11/2015  . Chronic respiratory failure with hypoxia (HCC) 02/11/2015  . Macrocytic anemia 02/09/2015  . Hypothyroidism 02/09/2015  . COPD (chronic obstructive pulmonary disease) (HCC) 12/29/2014     Palliative Care Assessment & Plan    1.Code Status:  DNR    Code Status Orders        Start     Ordered   09/23/15 0945  Do not attempt resuscitation (DNR)   Continuous    Question Answer Comment  In the event of cardiac or respiratory ARREST Do not call a "code blue"   In the event of cardiac or respiratory ARREST Do not perform Intubation, CPR, defibrillation or ACLS   In the event of cardiac or respiratory ARREST Use medication by any route, position, wound care, and other measures to relive pain and suffering. May use oxygen, suction and manual treatment of airway obstruction as needed for comfort.      09/23/15 0944    Advance Directive Documentation        Most Recent  Value   Type of Advance Directive  Out of facility DNR (pink MOST or yellow form)   Pre-existing out of facility DNR order (yellow form or pink MOST form)  Yellow form placed in chart (order not valid for inpatient use)   "MOST" Form in Place?         2. Goals of Care:   Limitations on Scope of Treatment: Avoid Hospitalization, Minimize Medications, No Artificial Feeding, No Diagnostics, No IV Fluids and No Lab Draws  Desire for further  Chaplaincy support:no  Psycho-social Needs: Education on Hospice  3. Symptom Management:       1. Agitation: Depakote 250 mg bid   4. Palliative Prophylaxis:    Bowel Regimen, Delirium Protocol, Frequent Pain Assessment, Oral Care and Palliative Wound Care  5. Prognosis: < 6 months  6. Discharge Planning:  Skilled Nursing Facility with Hospice   Care plan was discussed with Lovette Cliche and Dr Sharon Seller  Thank you for allowing the Palliative Medicine Team to assist in the care of this patient.   Time In: 0830 Time Out: 0930 Total Time 60 min Prolonged Time Billed  no         Canary Brim, NP  09/28/2015, 8:58 AM  Please contact Palliative Medicine Team phone at [phone] for questions and concerns.

## 2015-09-29 ENCOUNTER — Ambulatory Visit (HOSPITAL_COMMUNITY): Payer: Medicare Other

## 2015-10-02 ENCOUNTER — Telehealth: Payer: Self-pay | Admitting: Adult Health

## 2015-10-02 NOTE — Telephone Encounter (Signed)
Last ov on 09/11/15 with TP Lung mass - Julio Sicksammy S Parrett, NP at 09/11/2015 4:24 PM     Status: Written Related Problem: Lung mass   Expand All Collapse All   Lung mass -suspcious for carcinoma   Plan  Set up for PET scan          Checked reminders and noticed pt canceled PET scan on 09/29/15. Attempted to call pt to try and discuss the reason the PET scan was canceled and to try and reschedule the PET scan. LVM for to return call.

## 2015-10-04 NOTE — Telephone Encounter (Signed)
Pt was placed on hospice palliative care. TP was made aware Nothing further needed.

## 2015-10-26 DEATH — deceased

## 2015-12-14 ENCOUNTER — Ambulatory Visit: Payer: Medicare Other | Admitting: Pulmonary Disease

## 2016-04-01 IMAGING — CT CT HEAD W/O CM
2 of 5 series · 11 of 47 positions shown, 13 images · non-contrast
Comparison: Prior CT scan of the head and cervical spine 05/08/2009

CLINICAL DATA: 81-year-old female status post fall out of
wheelchair

EXAM:
CT HEAD WITHOUT CONTRAST
CT CERVICAL SPINE WITHOUT CONTRAST
TECHNIQUE: Multidetector CT imaging of the head and cervical spine was
performed following the standard protocol without intravenous
contrast. Multiplanar CT image reconstructions of the cervical spine
were also generated.

[Series 5: coronals · coronal · 0.22mm/px · 3 of 56 slices shown]
[im 19/56  brain]
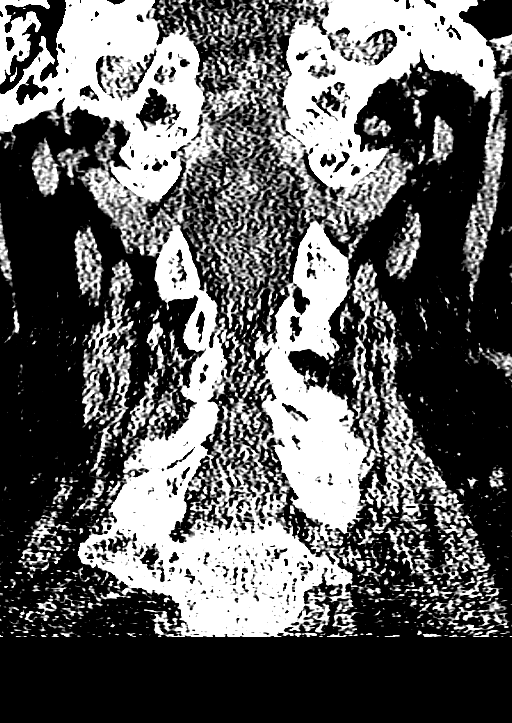
[im 25/56  brain]
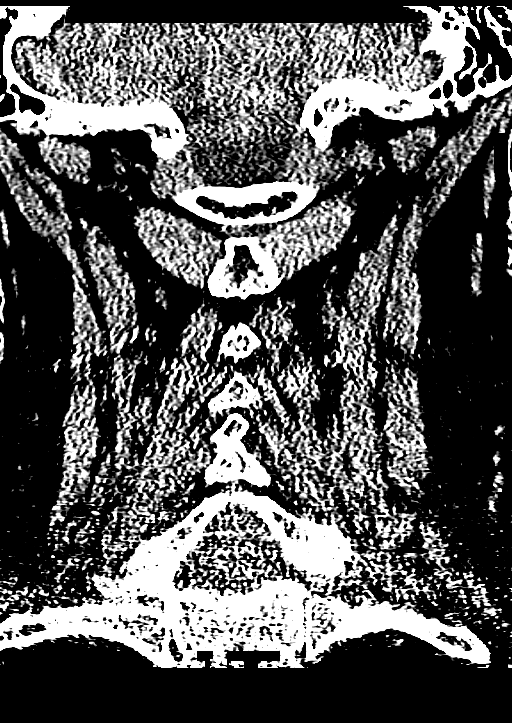
[im 31/56  brain]
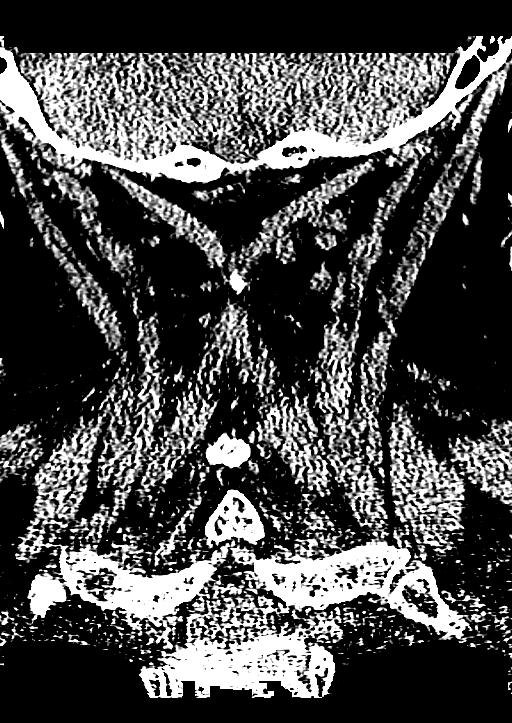

[Series 7: orthogonals · axial · 0.20mm/px · z∈[-264,-166]mm · 8 of 71 slices shown, 10 images]
[im 6/71  brain]
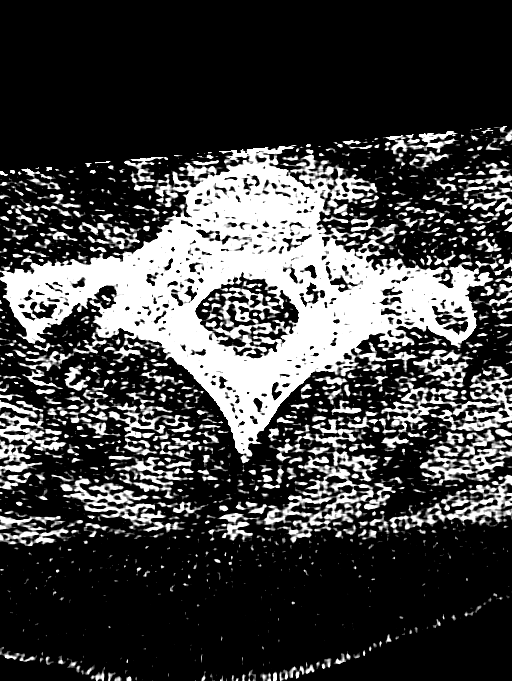
[im 6/71  bone]
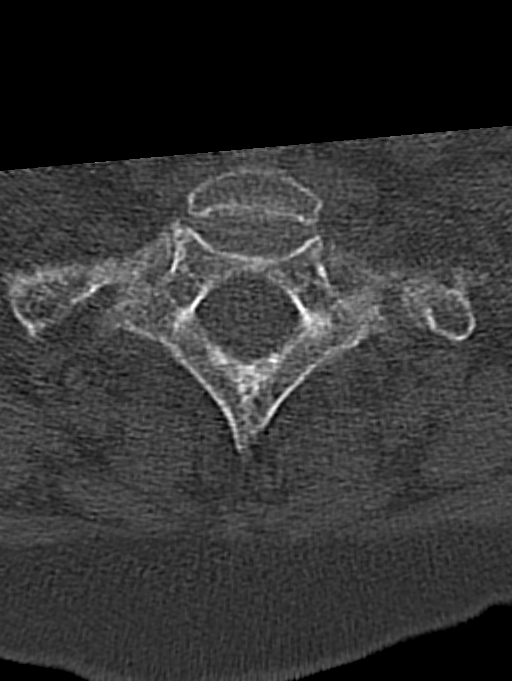
[im 18/71  brain]
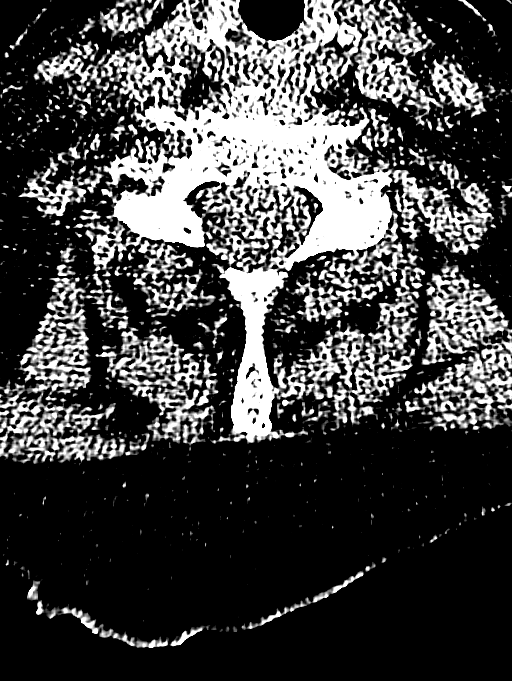
[im 24/71  brain]
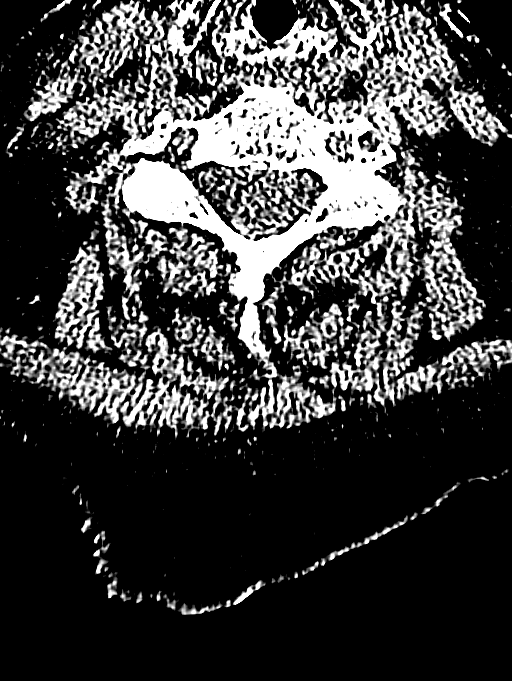
[im 30/71  brain]
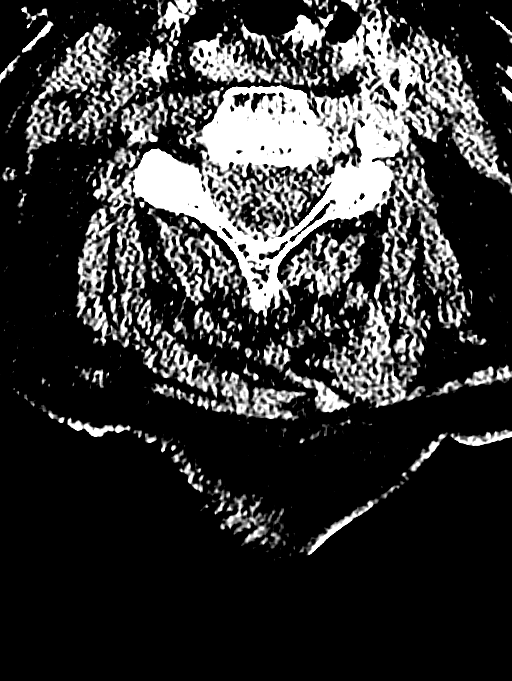
[im 41/71  brain]
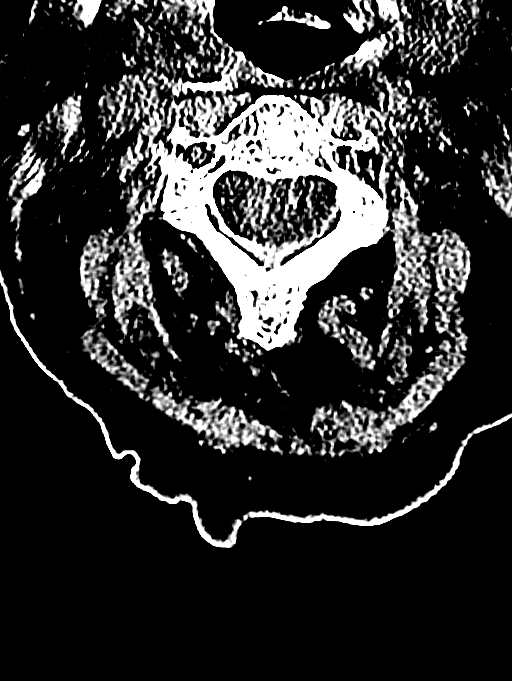
[im 41/71  bone]
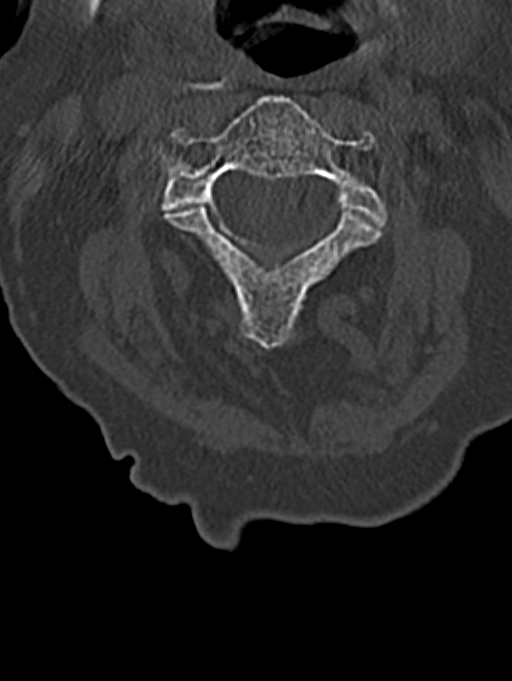
[im 47/71  brain]
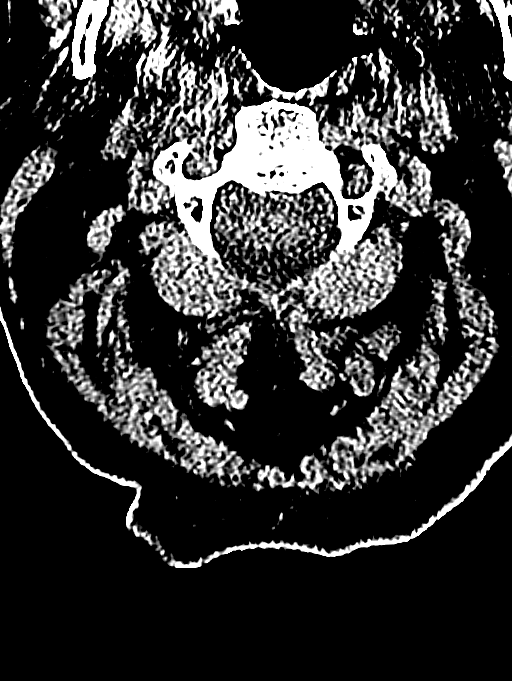
[im 53/71  brain]
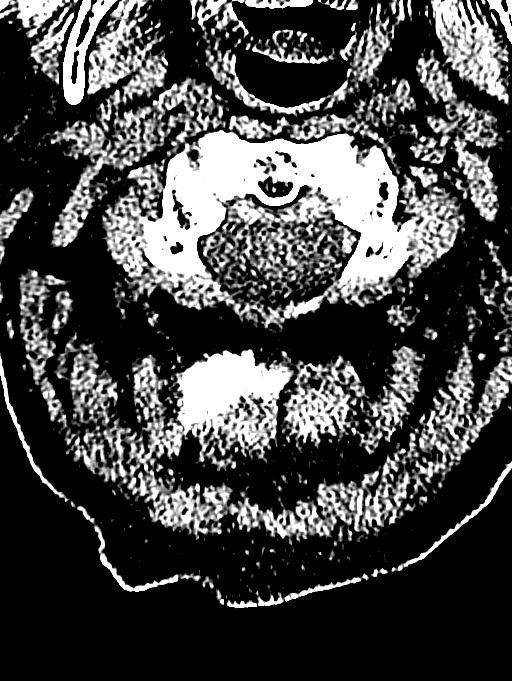
[im 65/71  brain]
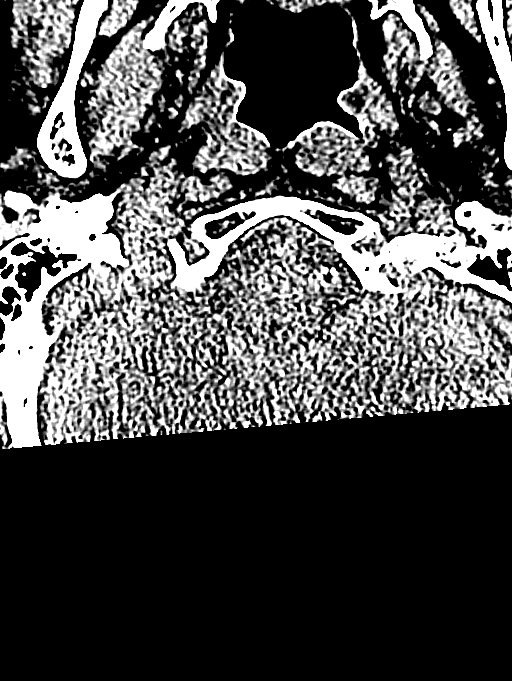

[11 of 47 positions shown; findings below may reference images not displayed]

FINDINGS: CT HEAD FINDINGS

Negative for acute intracranial hemorrhage, acute infarction, mass,
mass effect, hydrocephalus or midline shift. Gray-white
differentiation is preserved throughout. Small right occipital scalp
contusion. No evidence of underlying calvarial fracture. Globes and
orbits are symmetric bilaterally. Normal aeration of the mastoid air
cells and the visualized paranasal sinuses.

CT CERVICAL SPINE FINDINGS

No acute fracture, malalignment or prevertebral soft tissue
swelling. Exaggerated thoracic kyphosis. Mild multilevel cervical
spondylosis without focality. No acute soft tissue abnormality.
Biapical pulmonary emphysema.
IMPRESSION: CT HEAD

1. No acute intracranial abnormality.
2. Small right occipital scalp contusion.
CT CSPINE

1. No acute fracture or malalignment.
2. Pulmonary emphysema.
3. Exaggerated thoracic kyphosis.
